# Patient Record
Sex: Female | Born: 1935 | Race: White | Hispanic: No | Smoking: Former smoker
Health system: Southern US, Community
[De-identification: ages and names within clinical notes are randomized; demographics above are authoritative.]

## PROBLEM LIST (undated history)

## (undated) DIAGNOSIS — I679 Cerebrovascular disease, unspecified: Secondary | ICD-10-CM

## (undated) DIAGNOSIS — I351 Nonrheumatic aortic (valve) insufficiency: Secondary | ICD-10-CM

## (undated) DIAGNOSIS — I509 Heart failure, unspecified: Secondary | ICD-10-CM

## (undated) DIAGNOSIS — Z8719 Personal history of other diseases of the digestive system: Secondary | ICD-10-CM

## (undated) DIAGNOSIS — I340 Nonrheumatic mitral (valve) insufficiency: Secondary | ICD-10-CM

## (undated) DIAGNOSIS — K219 Gastro-esophageal reflux disease without esophagitis: Secondary | ICD-10-CM

## (undated) DIAGNOSIS — I350 Nonrheumatic aortic (valve) stenosis: Secondary | ICD-10-CM

## (undated) DIAGNOSIS — IMO0001 Reserved for inherently not codable concepts without codable children: Secondary | ICD-10-CM

## (undated) DIAGNOSIS — I739 Peripheral vascular disease, unspecified: Secondary | ICD-10-CM

## (undated) DIAGNOSIS — I10 Essential (primary) hypertension: Secondary | ICD-10-CM

## (undated) DIAGNOSIS — E119 Type 2 diabetes mellitus without complications: Secondary | ICD-10-CM

## (undated) DIAGNOSIS — I2510 Atherosclerotic heart disease of native coronary artery without angina pectoris: Secondary | ICD-10-CM

## (undated) DIAGNOSIS — M1990 Unspecified osteoarthritis, unspecified site: Secondary | ICD-10-CM

## (undated) DIAGNOSIS — I6529 Occlusion and stenosis of unspecified carotid artery: Secondary | ICD-10-CM

## (undated) DIAGNOSIS — E785 Hyperlipidemia, unspecified: Secondary | ICD-10-CM

## (undated) HISTORY — DX: Peripheral vascular disease, unspecified: I73.9

## (undated) HISTORY — PX: ABDOMINAL HYSTERECTOMY: SHX81

## (undated) HISTORY — DX: Nonrheumatic mitral (valve) insufficiency: I34.0

## (undated) HISTORY — DX: Heart failure, unspecified: I50.9

## (undated) HISTORY — DX: Type 2 diabetes mellitus without complications: E11.9

## (undated) HISTORY — PX: EYE SURGERY: SHX253

## (undated) HISTORY — DX: Cerebrovascular disease, unspecified: I67.9

## (undated) HISTORY — DX: Essential (primary) hypertension: I10

## (undated) HISTORY — DX: Occlusion and stenosis of unspecified carotid artery: I65.29

## (undated) HISTORY — DX: Hyperlipidemia, unspecified: E78.5

## (undated) HISTORY — DX: Nonrheumatic aortic (valve) insufficiency: I35.1

## (undated) HISTORY — DX: Atherosclerotic heart disease of native coronary artery without angina pectoris: I25.10

---

## 1997-11-13 ENCOUNTER — Inpatient Hospital Stay (HOSPITAL_COMMUNITY): Admission: RE | Admit: 1997-11-13 | Discharge: 1997-11-14 | Payer: Self-pay | Admitting: *Deleted

## 2000-05-24 HISTORY — PX: TYMPANOMASTOIDECTOMY: SHX34

## 2006-05-24 HISTORY — PX: OTHER SURGICAL HISTORY: SHX169

## 2007-01-12 ENCOUNTER — Ambulatory Visit: Payer: Self-pay | Admitting: *Deleted

## 2007-02-01 ENCOUNTER — Ambulatory Visit: Payer: Self-pay | Admitting: Vascular Surgery

## 2007-04-07 ENCOUNTER — Inpatient Hospital Stay (HOSPITAL_COMMUNITY): Admission: EM | Admit: 2007-04-07 | Discharge: 2007-04-10 | Payer: Self-pay | Admitting: Internal Medicine

## 2007-04-07 ENCOUNTER — Ambulatory Visit: Payer: Self-pay | Admitting: Internal Medicine

## 2007-04-07 ENCOUNTER — Encounter: Payer: Self-pay | Admitting: Internal Medicine

## 2007-04-07 ENCOUNTER — Ambulatory Visit: Payer: Self-pay | Admitting: Cardiovascular Disease

## 2007-04-07 HISTORY — PX: CARDIAC CATHETERIZATION: SHX172

## 2007-05-01 ENCOUNTER — Ambulatory Visit: Payer: Self-pay

## 2007-05-01 LAB — CONVERTED CEMR LAB
BUN: 16 mg/dL (ref 6–23)
CO2: 31 meq/L (ref 19–32)
Chloride: 105 meq/L (ref 96–112)
Creatinine, Ser: 1 mg/dL (ref 0.4–1.2)
Sodium: 142 meq/L (ref 135–145)

## 2007-05-02 ENCOUNTER — Ambulatory Visit: Payer: Self-pay | Admitting: Cardiology

## 2007-05-31 ENCOUNTER — Ambulatory Visit: Payer: Self-pay | Admitting: Cardiology

## 2007-05-31 LAB — CONVERTED CEMR LAB
Bilirubin, Direct: 0.1 mg/dL (ref 0.0–0.3)
Cholesterol: 146 mg/dL (ref 0–200)
HDL: 42.8 mg/dL (ref >39.0)
LDL Cholesterol: 76 mg/dL (ref 0–99)
Total CHOL/HDL Ratio: 3.4
Total Protein: 6.8 g/dL (ref 6.0–8.3)

## 2007-06-22 ENCOUNTER — Ambulatory Visit: Payer: Self-pay | Admitting: *Deleted

## 2007-06-22 ENCOUNTER — Ambulatory Visit: Payer: Self-pay | Admitting: Cardiology

## 2007-06-22 LAB — CONVERTED CEMR LAB
BUN: 17 mg/dL (ref 6–23)
Calcium: 9 mg/dL (ref 8.4–10.5)
Chloride: 108 meq/L (ref 96–112)
GFR calc non Af Amer: 74 mL/min

## 2007-07-27 ENCOUNTER — Ambulatory Visit: Payer: Self-pay | Admitting: Cardiology

## 2007-08-03 ENCOUNTER — Ambulatory Visit: Payer: Self-pay | Admitting: Cardiology

## 2007-08-03 LAB — CONVERTED CEMR LAB
BUN: 18 mg/dL (ref 6–23)
Calcium: 9.1 mg/dL (ref 8.4–10.5)
Chloride: 105 meq/L (ref 96–112)
Creatinine, Ser: 0.8 mg/dL (ref 0.4–1.2)
Potassium: 4.6 meq/L (ref 3.5–5.1)

## 2007-12-14 ENCOUNTER — Ambulatory Visit: Payer: Self-pay | Admitting: *Deleted

## 2008-01-31 ENCOUNTER — Ambulatory Visit: Payer: Self-pay | Admitting: Vascular Surgery

## 2008-02-08 ENCOUNTER — Ambulatory Visit: Payer: Self-pay | Admitting: Cardiology

## 2008-02-19 ENCOUNTER — Ambulatory Visit: Payer: Self-pay

## 2008-02-19 ENCOUNTER — Ambulatory Visit: Payer: Self-pay | Admitting: Cardiology

## 2008-02-19 ENCOUNTER — Encounter: Payer: Self-pay | Admitting: Cardiology

## 2008-02-19 LAB — CONVERTED CEMR LAB
AST: 20 units/L (ref 0–37)
Albumin: 4 g/dL (ref 3.5–5.2)
BUN: 19 mg/dL (ref 6–23)
Chloride: 108 meq/L (ref 96–112)
Cholesterol: 196 mg/dL (ref 0–200)
Creatinine, Ser: 0.9 mg/dL (ref 0.4–1.2)
GFR calc non Af Amer: 64 mL/min
LDL Cholesterol: 121 mg/dL — ABNORMAL HIGH (ref 0–99)
Triglycerides: 162 mg/dL — ABNORMAL HIGH (ref 0–149)
VLDL: 32 mg/dL (ref 0–40)

## 2008-03-29 ENCOUNTER — Ambulatory Visit: Payer: Self-pay | Admitting: Cardiology

## 2008-03-29 LAB — CONVERTED CEMR LAB
ALT: 17 units/L (ref 0–35)
Alkaline Phosphatase: 78 units/L (ref 39–117)
Bilirubin, Direct: 0.1 mg/dL (ref 0.0–0.3)
Cholesterol: 133 mg/dL (ref 0–200)
Total Protein: 7.2 g/dL (ref 6.0–8.3)
VLDL: 21 mg/dL (ref 0–40)

## 2008-06-13 ENCOUNTER — Ambulatory Visit: Payer: Self-pay | Admitting: *Deleted

## 2008-06-17 ENCOUNTER — Encounter: Admission: RE | Admit: 2008-06-17 | Discharge: 2008-06-17 | Payer: Self-pay

## 2008-06-21 ENCOUNTER — Encounter: Admission: RE | Admit: 2008-06-21 | Discharge: 2008-06-21 | Payer: Self-pay

## 2008-07-05 ENCOUNTER — Encounter: Admission: RE | Admit: 2008-07-05 | Discharge: 2008-07-05 | Payer: Self-pay

## 2008-08-02 DIAGNOSIS — I10 Essential (primary) hypertension: Secondary | ICD-10-CM

## 2008-08-02 DIAGNOSIS — I359 Nonrheumatic aortic valve disorder, unspecified: Secondary | ICD-10-CM

## 2008-08-02 DIAGNOSIS — I6350 Cerebral infarction due to unspecified occlusion or stenosis of unspecified cerebral artery: Secondary | ICD-10-CM | POA: Insufficient documentation

## 2008-08-02 DIAGNOSIS — E785 Hyperlipidemia, unspecified: Secondary | ICD-10-CM

## 2008-08-02 DIAGNOSIS — I739 Peripheral vascular disease, unspecified: Secondary | ICD-10-CM

## 2008-08-05 ENCOUNTER — Encounter: Payer: Self-pay | Admitting: Cardiology

## 2008-08-05 ENCOUNTER — Ambulatory Visit: Payer: Self-pay | Admitting: Cardiology

## 2008-08-05 DIAGNOSIS — I7389 Other specified peripheral vascular diseases: Secondary | ICD-10-CM | POA: Insufficient documentation

## 2008-08-05 DIAGNOSIS — I2510 Atherosclerotic heart disease of native coronary artery without angina pectoris: Secondary | ICD-10-CM

## 2008-08-05 DIAGNOSIS — E119 Type 2 diabetes mellitus without complications: Secondary | ICD-10-CM | POA: Insufficient documentation

## 2008-08-12 ENCOUNTER — Ambulatory Visit: Payer: Self-pay | Admitting: Cardiology

## 2008-08-12 LAB — CONVERTED CEMR LAB
ALT: 18 units/L (ref 0–35)
Alkaline Phosphatase: 78 units/L (ref 39–117)
BUN: 16 mg/dL (ref 6–23)
Bilirubin, Direct: 0 mg/dL (ref 0.0–0.3)
Calcium: 8.8 mg/dL (ref 8.4–10.5)
Cholesterol: 165 mg/dL (ref 0–200)
Creatinine, Ser: 0.8 mg/dL (ref 0.4–1.2)
GFR calc non Af Amer: 73.2 mL/min (ref >60)
Potassium: 4.1 meq/L (ref 3.5–5.1)
Total Protein: 6.9 g/dL (ref 6.0–8.3)

## 2008-12-11 ENCOUNTER — Ambulatory Visit: Payer: Self-pay | Admitting: Vascular Surgery

## 2008-12-16 ENCOUNTER — Encounter (INDEPENDENT_AMBULATORY_CARE_PROVIDER_SITE_OTHER): Payer: Self-pay | Admitting: *Deleted

## 2009-03-18 ENCOUNTER — Ambulatory Visit: Payer: Self-pay | Admitting: Cardiology

## 2009-03-20 ENCOUNTER — Telehealth: Payer: Self-pay | Admitting: Cardiology

## 2009-03-26 ENCOUNTER — Encounter (INDEPENDENT_AMBULATORY_CARE_PROVIDER_SITE_OTHER): Payer: Self-pay | Admitting: *Deleted

## 2009-04-03 ENCOUNTER — Telehealth (INDEPENDENT_AMBULATORY_CARE_PROVIDER_SITE_OTHER): Payer: Self-pay

## 2009-04-07 ENCOUNTER — Ambulatory Visit: Payer: Self-pay

## 2009-04-07 ENCOUNTER — Encounter: Payer: Self-pay | Admitting: Cardiology

## 2009-04-07 ENCOUNTER — Encounter (HOSPITAL_COMMUNITY): Admission: RE | Admit: 2009-04-07 | Discharge: 2009-05-23 | Payer: Self-pay | Admitting: Cardiology

## 2009-04-07 ENCOUNTER — Ambulatory Visit: Payer: Self-pay | Admitting: Internal Medicine

## 2009-04-07 ENCOUNTER — Ambulatory Visit (HOSPITAL_COMMUNITY): Admission: RE | Admit: 2009-04-07 | Discharge: 2009-04-07 | Payer: Self-pay | Admitting: Cardiology

## 2009-09-11 ENCOUNTER — Telehealth: Payer: Self-pay | Admitting: Cardiology

## 2010-01-15 ENCOUNTER — Ambulatory Visit: Payer: Self-pay | Admitting: Vascular Surgery

## 2010-02-27 ENCOUNTER — Encounter (INDEPENDENT_AMBULATORY_CARE_PROVIDER_SITE_OTHER): Payer: Self-pay | Admitting: *Deleted

## 2010-02-27 ENCOUNTER — Ambulatory Visit: Payer: Self-pay | Admitting: Cardiology

## 2010-03-09 ENCOUNTER — Encounter: Payer: Self-pay | Admitting: Cardiology

## 2010-03-09 ENCOUNTER — Ambulatory Visit: Payer: Self-pay

## 2010-03-09 ENCOUNTER — Ambulatory Visit: Payer: Self-pay | Admitting: Cardiology

## 2010-03-09 ENCOUNTER — Ambulatory Visit (HOSPITAL_COMMUNITY): Admission: RE | Admit: 2010-03-09 | Discharge: 2010-03-09 | Payer: Self-pay

## 2010-06-21 LAB — CONVERTED CEMR LAB
ALT: 14 units/L (ref 0–35)
Albumin: 3.8 g/dL (ref 3.5–5.2)
Alkaline Phosphatase: 89 units/L (ref 39–117)
BUN: 12 mg/dL (ref 6–23)
BUN: 15 mg/dL (ref 6–23)
Bilirubin, Direct: 0 mg/dL (ref 0.0–0.3)
Chloride: 106 meq/L (ref 96–112)
Cholesterol: 174 mg/dL (ref 0–200)
Cholesterol: 185 mg/dL (ref 0–200)
Creatinine, Ser: 0.8 mg/dL (ref 0.4–1.2)
Creatinine, Ser: 0.8 mg/dL (ref 0.4–1.2)
GFR calc non Af Amer: 73.09 mL/min (ref >60)
Glucose, Bld: 106 mg/dL — ABNORMAL HIGH (ref 70–99)
LDL Cholesterol: 100 mg/dL — ABNORMAL HIGH (ref 0–99)
LDL Cholesterol: 114 mg/dL — ABNORMAL HIGH (ref 0–99)
Total Bilirubin: 0.5 mg/dL (ref 0.3–1.2)
Total Bilirubin: 0.7 mg/dL (ref 0.3–1.2)
Total Protein: 6.8 g/dL (ref 6.0–8.3)
Triglycerides: 119 mg/dL (ref 0.0–149.0)
VLDL: 23.8 mg/dL (ref 0.0–40.0)

## 2010-06-23 NOTE — Letter (Signed)
Summary: Custom - Lipid  [facility], Main Office  [address]   [place], [place] [address]   Phone: [phone]  Fax: [phone]     February 27, 2010 MRN: [id number]   BREAWNA MONTENEGRO [address] [place], [place]  [address]   Dear Ms. Alcock,  We have reviewed your cholesterol results.  They are as follows:     Total Cholesterol:    174 (Desirable: less than 200)       HDL  Cholesterol:     51.90  (Desirable: greater than 40 for men and 50 for women)       LDL Cholesterol:       100  (Desirable: less than 100 for low risk and less than 70 for moderate to high risk)       Triglycerides:       112.0  (Desirable: less than 150)  Our recommendations include:These numbers look good. Continue on the same medicine. Sodium, potassium, kidney and Liver function are normal. Take care, Dr. Darel Hong.    Call our office at the number listed above if you have any questions.  Lowering your LDL cholesterol is important, but it is only one of a large number of "risk factors" that may indicate that you are at risk for heart disease, stroke or other complications of hardening of the arteries.  Other risk factors include:   A.  Cigarette Smoking* B.  High Blood Pressure* C.  Obesity* D.   Low HDL Cholesterol (see yours above)* E.   Diabetes Mellitus (higher risk if your is uncontrolled) F.  Family history of premature heart disease G.  Previous history of stroke or cardiovascular disease    *These are risk factors YOU HAVE CONTROL OVER.  For more information, visit .  There is now evidence that lowering the TOTAL CHOLESTEROL AND LDL CHOLESTEROL can reduce the risk of heart disease.  The American Heart Association recommends the following guidelines for the treatment of elevated cholesterol:  1.  If there is now current heart disease and less than two risk factors, TOTAL CHOLESTEROL should be less than 200 and LDL CHOLESTEROL should be less than 100. 2.  If  there is current heart disease or two or more risk factors, TOTAL CHOLESTEROL should be less than 200 and LDL CHOLESTEROL should be less than 70.  A diet low in cholesterol, saturated fat, and calories is the cornerstone of treatment for elevated cholesterol.  Cessation of smoking and exercise are also important in the management of elevated cholesterol and preventing vascular disease.  Studies have shown that 30 to 60 minutes of physical activity most days can help lower blood pressure, lower cholesterol, and keep your weight at a healthy level.  Drug therapy is used when cholesterol levels do not respond to therapeutic lifestyle changes (smoking cessation, diet, and exercise) and remains unacceptably high.  If medication is started, it is important to have you levels checked periodically to evaluate the need for further treatment options.  Thank you,    [employer] Team

## 2010-06-23 NOTE — Progress Notes (Signed)
Summary: med question/calling back  Medications Added LISINOPRIL 40 MG TABS (LISINOPRIL) Take one tablet by mouth daily       Phone Note Call from Patient Call back at [phone]   Caller: Daughter/Shirley Summary of Call: has a question about lisinopril dosage, she thought it was 40mg  1 tab daily Initial call taken by: Migdalia Dk,  September 11, 2009 12:25 PM  Follow-up for Phone Call        [facility] Scherrie Bateman, LPN  September 11, 2009 12:45 PM lmtcb Scherrie Bateman, LPN  September 15, 2009 3:13 PM  spoke with dtr, questions answered Deliah Goody, RN  September 17, 2009 8:37 AM     New/Updated Medications: LISINOPRIL 40 MG TABS (LISINOPRIL) Take one tablet by mouth daily Prescriptions: LISINOPRIL 40 MG TABS (LISINOPRIL) Take one tablet by mouth daily  #90 x 4   Entered by:   Deliah Goody, RN   Authorized by:   Ferman Hamming, MD, [facility]   Signed by:   Deliah Goody, RN on 09/17/2009   Method used:   Electronically to        [facility] [phone][facility] (retail)       [address]       [place], [place]  [address]       Ph: [phone]       Fax: [phone]   RxID:   [id number]

## 2010-06-23 NOTE — Progress Notes (Signed)
Summary: med question  Medications Added PLAVIX 75 MG TABS (CLOPIDOGREL BISULFATE) 1 tab by mouth once daily ASPIRIN 81 MG  TABS (ASPIRIN) 1 tab by mouth once daily LIPITOR 80 MG TABS (ATORVASTATIN CALCIUM) 1 tab by mouth once daily LOPRESSOR 50 MG TABS (METOPROLOL TARTRATE) 1 tab two times a day LYRICA 150 MG CAPS (PREGABALIN) 1 tab two times a day GLIPIZIDE 5 MG TABS (GLIPIZIDE) 1 tab by mouth once daily FOSAMAX 70 MG TABS (ALENDRONATE SODIUM) 1 tab by mouth weekly FOSAMAX 70 MG TABS (ALENDRONATE SODIUM) 1 tab by mouth weekly LISINOPRIL 20 MG TABS (LISINOPRIL) two times a day LISINOPRIL 20 MG TABS (LISINOPRIL) 1 tab by mouth once daily * CALCIUM 600 -VITAMIN D  TABS (CALCIUM-VITAMIN D) 1 tab by mouth once daily       Phone Note Call from Patient Call back at [phone]   Caller: Daughter/Shirley Summary of Call: has a question about lisinopril Initial call taken by: Migdalia Dk,  September 11, 2009 8:45 AM  Follow-up for Phone Call        PER DAUGHTER NEEDED REFILL ON  LISINOPRIL .DONE THROUGH EMR Follow-up by: Scherrie Bateman, LPN,  September 11, 2009 10:18 AM    Prescriptions: LISINOPRIL 20 MG TABS (LISINOPRIL) two times a day  #180 x 3   Entered by:   Scherrie Bateman, LPN   Authorized by:   Ferman Hamming, MD, [facility]   Signed by:   Scherrie Bateman, LPN on 45/40/9811   Method used:   [facility]ronically to        [facility] [phone]* (retail)       [address]       [place], [place]  [address]       Ph: [phone]       Fax: [phone]   RxID:   [id number]

## 2010-06-23 NOTE — Assessment & Plan Note (Signed)
Summary: 1 YR/DMP    CC:  yearly visit.  History of Present Illness: Pleasant female with past medical history of coronary artery disease status post drug-eluting stent to the LAD and circumflex in November of 2008, moderate aortic stenosis and mild to moderate mitral regurgitation. Last echocardiogram was performed in Nov 2010. There was normal LV function. There was moderate aortic stenosis with a mean gradient of 22 mmHg. There was mild mitral regurgitation. Myoview in November of 2010 showed inferolateral thinning but no ischemia. Ejection fraction was 58%. I last saw her in Oct 2010. Since then she does have dyspnea with moderate activities but not with routine activities. It is relieved with rest. There is no associated chest pain. No orthopnea,  pedal edema, palpitations, syncoe or exertional chest pain. Occasional PND.  Current Medications (verified): 1)  Aspirin 81 Mg  Tabs (Aspirin) .Marland Kitchen.. 1 Tab By Mouth Once Daily 2)  Lipitor 80 Mg Tabs (Atorvastatin Calcium) .Marland Kitchen.. 1 Tab By Mouth Once Daily 3)  Lopressor 50 Mg Tabs (Metoprolol Tartrate) .Marland Kitchen.. 1 Tab Two Times A Day 4)  Lyrica 150 Mg Caps (Pregabalin) .Marland Kitchen.. 1 Tab Two Times A Day 5)  Glipizide 5 Mg Tabs (Glipizide) .Marland Kitchen.. 1 Tab By Mouth Once Daily 6)  Lisinopril 40 Mg Tabs (Lisinopril) .... Take One Tablet By Mouth Daily  Allergies: No Known Drug Allergies  Past History:  Past Medical History: Aortic Stenosis  mildl regurgitation CAD Cerebrovascular Disease Diabetes Type 1 Hyperlipidemia Hypertension PVD  Past Surgical History: Reviewed history from 08/05/2008 and no changes required. right tympanomastidectomy 2002 heart cath 04/07/07 by Dr Excell Seltzer 1. Severe LAD stenosis with successful PCI using a drug-eluting stent.   2. Severe left circumflex stenosis with successful PCI using a single       drug-eluting stent.  Social History: Reviewed history from 03/18/2009 and no changes required. 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 Remaining systems are negative.   Vital Signs:  Patient profile:   77 year old female Height:      62 inches Weight:      106 pounds BMI:     19.46 Pulse rate:   53 / minute Resp:     16 per minute BP sitting:   175 / 65  (left arm)  Vitals Entered By: Kem Parkinson (February 27, 2010 8:51 AM)  Physical Exam  General:  Well-developed well-nourished in no acute distress.  Skin is warm and dry.  HEENT is normal.  Neck is supple. No thyromegaly.  Chest is clear to auscultation with normal expansion.  Cardiovascular exam is regular rate and rhythm. 3/6 systolic murmur left sternal border. s2 preserved Abdominal exam nontender or distended. No masses palpated. Extremities show no edema. neuro grossly intact    EKG  Procedure date:  02/27/2010  Findings:      Sinus bradycardia at a rate of 52. Nonspecific ST changes.  Impression & Recommendations:  Problem # 1:  PERIPHERAL VASCULAR DISEASE WITH CLAUDICATION (ICD-443.89) Peripheral vascular disease and cerebrovascular disease followed by vascular surgery. Continue aspirin and statin.   Problem # 2:  AODM (ICD-250.00)  Her updated medication list for this problem includes:    Aspirin  81 Mg Tabs (Aspirin) .Marland Kitchen... 1 tab by mouth once daily    Glipizide 5 Mg Tabs (Glipizide) .Marland Kitchen... 1 tab by mouth once daily    Lisinopril 40 Mg Tabs (Lisinopril) .Marland Kitchen... Take one tablet by mouth daily  Her updated medication list for this problem includes:    Aspirin 81 Mg Tabs (Aspirin) .Marland Kitchen... 1 tab by mouth once daily    Glipizide 5 Mg Tabs (Glipizide) .Marland Kitchen... 1 tab by mouth once daily    Lisinopril 40 Mg Tabs  (Lisinopril) .Marland Kitchen... Take one tablet by mouth daily  Problem # 3:  CORONARY ATHEROSCLEROSIS NATIVE CORONARY ARTERY (ICD-414.01) Ctinue aspirin, beta blocker, ACE inhibitor and statin. The following medications were removed from the medication list:    Plavix 75 Mg Tabs (Clopidogrel bisulfate) .Marland Kitchen... 1 tab by mouth once daily Her updated medication list for this problem includes:    Aspirin 81 Mg Tabs (Aspirin) .Marland Kitchen... 1 tab by mouth once daily    Lopressor 50 Mg Tabs (Metoprolol tartrate) .Marland Kitchen... 1 tab two times a day    Lisinopril 40 Mg Tabs (Lisinopril) .Marland Kitchen... Take one tablet by mouth daily  The following medications were removed from the medication list:    Plavix 75 Mg Tabs (Clopidogrel bisulfate) .Marland Kitchen... 1 tab by mouth once daily Her updated medication list for this problem includes:    Aspirin 81 Mg Tabs (Aspirin) .Marland Kitchen... 1 tab by mouth once daily    Lopressor 50 Mg Tabs (Metoprolol tartrate) .Marland Kitchen... 1 tab two times a day    Lisinopril 40 Mg Tabs (Lisinopril) .Marland Kitchen... Take one tablet by mouth daily  Problem # 4:  AORTIC STENOSIS (ICD-424.1) Dyspnea on Exertion but Aortic Stenosis Does Not Sound Severe on Examination. Repeat Echocardiogram. Patient understands she will most likely need aortic valve replacement in the future. Her updated medication list for this problem includes:    Lopressor 50 Mg Tabs (Metoprolol tartrate) .Marland Kitchen... 1 tab two times a day    Lisinopril 40 Mg Tabs (Lisinopril) .Marland Kitchen... Take one tablet by mouth daily  Orders: Echocardiogram (Echo)  Problem # 5:  HYPERLIPIDEMIA-MIXED (ICD-272.4) Continue statin. Check lipids and liver. Her updated medication list for this problem includes:    Lipitor 80 Mg Tabs (Atorvastatin calcium) .Marland Kitchen... 1 tab by mouth once daily  Orders: TLB-Lipid Panel (80061-LIPID) TLB-Hepatic/Liver Function Pnl (80076-HEPATIC)  Problem # 6:  HYPERTENSION, BENIGN (ICD-401.1) Blood pressure mildly elevated but she has not taken her medications this a.m. She will  follow this and we will adjust as needed. Check potassium and renal function. Her updated medication list for this problem includes:    Aspirin 81 Mg Tabs (Aspirin) .Marland Kitchen... 1 tab by mouth once daily    Lopressor 50 Mg Tabs (Metoprolol tartrate) .Marland Kitchen... 1 tab two times a day    Lisinopril 40 Mg Tabs (Lisinopril) .Marland Kitchen... Take one tablet by mouth daily  Orders: TLB-BMP (Basic Metabolic Panel-BMET) (80048-METABOL)  Patient Instructions: 1)  Your physician recommends that you schedule a follow-up appointment in:  6 MONTHS 2)  Your physician has requested that you have an echocardiogram.  Echocardiography is a painless test that uses sound waves to create images of your heart. It provides your doctor with information about the size and shape of your heart and how well your heart's chambers and valves are working.  This procedure takes approximately one hour. There are no restrictions for this procedure.

## 2010-07-21 ENCOUNTER — Ambulatory Visit (INDEPENDENT_AMBULATORY_CARE_PROVIDER_SITE_OTHER): Payer: Medicare Other | Admitting: Vascular Surgery

## 2010-07-21 ENCOUNTER — Other Ambulatory Visit (INDEPENDENT_AMBULATORY_CARE_PROVIDER_SITE_OTHER): Payer: Medicare Other

## 2010-07-21 DIAGNOSIS — I6529 Occlusion and stenosis of unspecified carotid artery: Secondary | ICD-10-CM

## 2010-07-22 NOTE — Assessment & Plan Note (Signed)
OFFICE VISIT  MIRIANA, GAERTNER DOB:  [birth year]                                       07/21/2010 ZOXWR#:[id number]  The patient presents today for continued follow-up of her asymptomatic carotid disease.  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  She is here today for evaluation and follow-up of extracranial cerebrovascular occlusive disease.  This has been followed for some time in our office. She has not had any prior amaurosis fugax, transient ischemic attack or stroke.  She has had no significant progression in past studies.  She also reports that her walking has improved with minimal discomfort from her varicosities or her lower extremity mild arterial insufficiency.  PHYSICAL EXAMINATION:  A well-developed, well-nourished white female appearing younger than stated age.  No acute distress.  Blood pressure is 168/65 in the right arm, 188/75 in her left arm, heart rate 62, respirations 18.  HEENT is normal.  Her carotid arteries reveal a harsh right carotid bruit, soft left carotid bruit.  Radial pulses are 2+ bilaterally.  Musculoskeletal shows no major deformity or cyanosis. Neurologic:  No focal weakness or paresthesia.  Skin without ulcers or rashes.  She underwent a repeat carotid duplex in our office.  This reveals no stenosis in her right carotid system.  She does have moderate left stenosis in the 60%-79% range.  She does have tortuosity, which may be partially responsible for this elevated reading.  I discussed this with the patient and her daughter present.  I have recommended continued 6- month serial duplex follow-up to rule out any progression.  She understands symptoms of carotid disease and will notify us if any of these should occur.    Larina Earthly, M.D. Electronically Signed  TFE/MEDQ  D:  07/21/2010  T:  07/22/2010  Job:  [id number]  cc:   Dr. Lillie Columbia,  [place]

## 2010-07-28 NOTE — Procedures (Unsigned)
CAROTID DUPLEX EXAM  INDICATION:  Follow up carotid artery disease.  HISTORY: Diabetes:  No. Cardiac:  MI and CABG. Hypertension:  Yes. Smoking:  Previous. Previous Surgery:  No. CV History:  Patient is currently asymptomatic. Amaurosis Fugax No, Paresthesias No, Hemiparesis No.                                      RIGHT             LEFT Brachial systolic pressure:         148               142 Brachial Doppler waveforms:         WNL               WNL Vertebral direction of flow:        Antegrade         Antegrade DUPLEX VELOCITIES (cm/sec) CCA peak systolic                   77                68 ECA peak systolic                   182               187 ICA peak systolic                   95                210 ICA end diastolic                   29                63 PLAQUE MORPHOLOGY:                  Heterogenous      Heterogenous PLAQUE AMOUNT:                      Mild              Moderate PLAQUE LOCATION:                    ICA               ICA  IMPRESSION: 1. Right side, no hemodynamically significant stenosis within the     internal carotid artery. 2. Left side is 60% to 79% stenosis.  This could be secondary to     vessel tortuosity. 3. Right external carotid artery stenosis. 4. Bilateral mild intimal thickening within the common carotid     arteries. 5. Study is stable compared to previous.  ___________________________________________ Larina Earthly, M.D.  OD/MEDQ  D:  07/21/2010  T:  07/21/2010  Job:  [id number]

## 2010-09-14 ENCOUNTER — Encounter: Payer: Self-pay | Admitting: Cardiology

## 2010-09-15 ENCOUNTER — Ambulatory Visit (INDEPENDENT_AMBULATORY_CARE_PROVIDER_SITE_OTHER): Payer: Medicare Other | Admitting: Cardiology

## 2010-09-15 ENCOUNTER — Encounter: Payer: Self-pay | Admitting: Cardiology

## 2010-09-15 VITALS — BP 160/80 | HR 51 | Resp 12 | Ht 61.0 in | Wt 112.0 lb

## 2010-09-15 DIAGNOSIS — E785 Hyperlipidemia, unspecified: Secondary | ICD-10-CM

## 2010-09-15 DIAGNOSIS — I10 Essential (primary) hypertension: Secondary | ICD-10-CM

## 2010-09-15 DIAGNOSIS — I2510 Atherosclerotic heart disease of native coronary artery without angina pectoris: Secondary | ICD-10-CM

## 2010-09-15 DIAGNOSIS — I359 Nonrheumatic aortic valve disorder, unspecified: Secondary | ICD-10-CM

## 2010-09-15 DIAGNOSIS — I350 Nonrheumatic aortic (valve) stenosis: Secondary | ICD-10-CM

## 2010-09-15 DIAGNOSIS — I739 Peripheral vascular disease, unspecified: Secondary | ICD-10-CM

## 2010-09-15 NOTE — Patient Instructions (Signed)
Your physician recommends that you schedule a follow-up appointment in: 6 months with Dr. Jens Som   Your physician has requested that you have an echocardiogram. Echocardiography is a painless test that uses sound waves to create images of your heart. It provides your doctor with information about the size and shape of your heart and how well your heart's chambers and valves are working. This procedure takes approximately one hour. There are no restrictions for this procedure.

## 2010-09-15 NOTE — Assessment & Plan Note (Signed)
Patient has both aortic stenosis and aortic insufficiency. It had worsened on most recent echocardiogram. No significant symptoms. Repeat echo. I explained to patient that she will most likely require aortic valve replacement in the future.

## 2010-09-15 NOTE — Assessment & Plan Note (Signed)
Continue statin. 

## 2010-09-15 NOTE — Assessment & Plan Note (Signed)
Blood pressure elevated but she has not taken her medication yet today. We will follow and adjust as needed.

## 2010-09-15 NOTE — Progress Notes (Signed)
BMW:UXLKGMWN female with past medical history of coronary artery disease status post drug-eluting stent to the LAD and circumflex in November of 2008, AS/AI. Last echocardiogram was performed in Oct of 2011. There was normal LV function. There was moderate to severe aortic stenosis with a mean gradient of 31 mmHg; moderate AI. There was mild mitral regurgitation. Myoview in November of 2010 showed inferolateral thinning but no ischemia. Ejection fraction was 58%. I last saw her in Oct 2011. Since then she does have dyspnea with moderate activities but not with routine activities. It is relieved with rest. There is no associated chest pain. No orthopnea,  pedal edema, palpitations, syncope or exertional chest pain.  Current Outpatient Prescriptions  Medication Sig Dispense Refill  . aspirin 81 MG tablet Take 81 mg by mouth daily.        Marland Kitchen atorvastatin (LIPITOR) 80 MG tablet Take 80 mg by mouth daily.        Marland Kitchen glipiZIDE (GLUCOTROL) 5 MG tablet Take 5 mg by mouth daily.        Marland Kitchen lisinopril (PRINIVIL,ZESTRIL) 40 MG tablet Take 40 mg by mouth daily.        . metoprolol (LOPRESSOR) 50 MG tablet Take 50 mg by mouth 2 (two) times daily.        . pregabalin (LYRICA) 150 MG capsule Take 150 mg by mouth 2 (two) times daily.           Past Medical History  Diagnosis Date  . Aortic stenosis   . CAD (coronary artery disease)   . CVD (cerebrovascular disease)   . Diabetes mellitus type 1   . Hyperlipidemia   . Hypertension   . PVD (peripheral vascular disease)   . Mitral regurgitation   . Aortic insufficiency     Past Surgical History  Procedure Date  . Tympanomastoidectomy 2002    right  . Cardiac catheterization 04/07/07    By Dr. Excell Seltzer -- 1. Severe LAD stenosis with successful PCI using a drug-eluting stent. 2. Severe left circumflex stenosis with successful PCI using a single  drug-eluting stent.    History   Social History  . Marital Status: Widowed    Spouse Name: N/A    Number of  Children: N/A  . Years of Education: N/A   Occupational History  . 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 Smoking status: Former Games developer  . Smokeless tobacco: Not on file  . Alcohol Use: Yes  . Drug Use: No  . Sexually Active: Not on file   Other Topics Concern  . Not on file   Social History Narrative  . No narrative on file    ROS: no fevers or chills, productive cough, hemoptysis, dysphasia, odynophagia, melena, hematochezia, dysuria, hematuria, rash, seizure activity, orthopnea, PND, pedal edema, claudication. Remaining systems are negative.  Physical Exam: Well-developed well-nourished in no acute distress.  Skin is warm and dry.  HEENT is normal.  Neck is supple. No thyromegaly.  Chest is clear to auscultation with normal expansion.  Cardiovascular exam is regular rate and rhythm. 3/6 systolic murmur left sternal border. Abdominal exam nontender or distended. No masses palpated. Extremities show no edema. neuro grossly intact  ECG Sinus bradycardia at a rate of 51. Prior inferior infarct. Left ventricular hypertrophy with repolarization abnormality.

## 2010-09-15 NOTE — Assessment & Plan Note (Signed)
Continue aspirin, beta blocker and statin. 

## 2010-09-15 NOTE — Assessment & Plan Note (Signed)
Peripheral vascular disease and cerebrovascular disease followed by vascular surgery. Continue aspirin and statin.

## 2010-10-05 ENCOUNTER — Ambulatory Visit (HOSPITAL_COMMUNITY): Payer: Medicare Other | Attending: Cardiology | Admitting: Radiology

## 2010-10-05 DIAGNOSIS — I359 Nonrheumatic aortic valve disorder, unspecified: Secondary | ICD-10-CM | POA: Insufficient documentation

## 2010-10-05 DIAGNOSIS — I350 Nonrheumatic aortic (valve) stenosis: Secondary | ICD-10-CM

## 2010-10-06 NOTE — Assessment & Plan Note (Signed)
[facility]                            CARDIOLOGY OFFICE NOTE   BIRDELL, FRASIER                   MRN:          [id number]  DATE:07/27/2007                            DOB:          12-Oct-1925    Ms. Acoff is a pleasant 77 year old female with a history of coronary  disease status post drug-eluting stent to the LAD as well as the  circumflex.  This was performed in November of 2008.  Note, she also has  preserved LV function and mild aortic stenosis as well as mild-to-  moderate mitral regurgitation.  Since I last saw her, she is doing well.  She denies any dyspnea, chest pain, palpitations, or syncope.  There is  no pedal edema.  She does have some pain in her lower extremities  bilaterally with ambulation at longer distances.  She has discontinued  her tobacco use.   MEDICATIONS:  1. Plavix 75 mg p.o. daily.  2. Aspirin 81 mg p.o. daily.  3. Lipitor 80 mg p.o. daily.  4. Lopressor 50 mg p.o. b.i.d.  5. Lisinopril 10 mg p.o. daily.  6. Lyrica 150 mg p.o. b.i.d.  7. Glipizide XL 5 mg p.o. daily.  8. Fosamax.   PHYSICAL EXAMINATION:  VITAL SIGNS:  Blood pressure of 172/78, pulse is  56.  She weighs 107 pounds.  HEENT:  Normal.  NECK:  Supple.  CHEST:  Clear.  CARDIOVASCULAR:  Regular rhythm.  There is a 2/6 systolic murmur at the  left sternal border.  ABDOMEN:  Benign.  EXTREMITIES:  No edema.  She has 2+ dorsalis pedis pulse on the right  and 1+ on the left.   Her electrocardiogram shows a sinus bradycardia at a rate of 52.  There  is left ventricle hypertrophy and nonspecific ST changes.   DIAGNOSES:  1. Coronary artery disease status post drug-eluting stent to the left      anterior descending and circumflex in November of 2008.  She will      continue on her aspirin and Plavix as well as her angiotensin-      converting enzyme inhibitor, beta blocker, and statin.  2. History of aortic stenosis and mild-to-moderate mitral     regurgitation.  We will plan to repeat her echocardiogram in      November.  3. Hyperlipidemia.  She will continue on her statin.  4. Tobacco abuse.  She has now discontinued this.  5. Claudication.  We discussed continuing to increase her ambulation.      If this becomes significantly limiting in the future, then we can      pursue further evaluation.  Note, she has been seen by      cardiovascular and thoracic surgery in the past for this.  6. Cerebrovascular disease.  This is being followed by Dr. Madilyn Fireman of      vascular surgery.  7. Hypertension.  Blood pressure is elevated today.  We will increase      her lisinopril to 20 mg p.o. daily, and she will continue to track      this at home, and we will increase  as needed.  I will check a basic      metabolic panel in 1 week to follow potassium and renal function.  8. Diabetes mellitus.  Per primary care physician.   She will see me back in 6 months.     Madolyn Frieze Jens Som, MD, [facility]  Electronically Signed    BSC/MedQ  DD: 07/27/2007  DT: 07/27/2007  Job #: [id number]

## 2010-10-06 NOTE — Discharge Summary (Signed)
NAMEJADORE, Kara Santiago            ACCOUNT NO.:  [id number]   MEDICAL RECORD NO.:  [id number]          PATIENT TYPE:  INP   LOCATION:  2019                         FACILITY:  [facility]   PHYSICIAN:  Veverly Fells. Excell Seltzer, MD  DATE OF BIRTH:  1926-02-18   DATE OF ADMISSION:  04/07/2007  DATE OF DISCHARGE:  04/10/2007                               DISCHARGE SUMMARY   PRIMARY CARDIOLOGIST:  Dr. Olga Millers.   PRIMARY CARE PHYSICIAN:  Dr. Lillie Columbia at [facility].   PROCEDURES PERFORMED DURING HOSPITALIZATION:  Cardiac catheterization  per Dr. Tonny Bollman completed April 07, 2007 revealing:  1. Severe LAD stenosis.  2. Severe left circumflex stenosis.  3. Nonobstructive right coronary artery stenosis.  4. Calcified aortic valve with 15 to 20 mm peak gradient suggestive of      mild aortic stenosis.  5. Status post PCI of LAD stenosis using drug eluting stent.  6. Status post PCI of left circumflex using single drug eluting stent.   FINAL DISCHARGE DIAGNOSIS:  Non-ST elevated myocardial infarction.   SECONDARY DIAGNOSES:  1. Aortic valve stenosis with mild aortic stenosis per catheterization      with 15 to 20 mm peak gradient.  2. Hypertension.  3. Diabetes.  4. Tobacco abuse.   HOSPITAL COURSE:  This is a 77 year old Caucasian female who presented  with non-ST elevated MI and was seen emergently by Dr. Tonny Bollman  with emergent cardiac catheterization.  Positive cardiac enzymes were  found with troponin of 11.20, elevated from an initial troponin of 0.98.  The patient's CK MB was 155.6.  Chest x-ray revealed mild pulmonary  edema.  The patient was subsequently sent to the cardiac catheterization  lab secondary to symptoms of chest pain and catheterization was  completed.  The patient had successful PCI with a drug eluting stent to  the LAD, and to the left circumflex decreasing the LAD from 80% to 0%,  and left circumflex from 99% to 0%.  The patient did  have some  nonobstructive right coronary artery stenosis, 30% proximally, and 20%  mid.  The patient was treated with aspirin and Plavix, and followed over  the weekend for continued evaluation.  She was found to have some mild  pulmonary edema on chest x-ray and was given p.o. Lasix for diuresis.  She was started on beta blocker, Plavix, and aspirin post procedure.   The patient continued to have some mild generalized fatigue, and I just  feel weak symptoms, but no recurrence of chest pain.  The patient was  up walking the hall with cardiac rehab in the unit, and in the stepdown  unit as well.  The patient was transferred to telemetry over the weekend  and did well.  The patient's blood pressure was well-controlled and  telemetry revealed sinus bradycardia without any ectopy.   On the day of discharge, the patient was seen and examined by Dr.  Tonny Bollman and found to be stable for discharge with follow up  appointment to be completed to see Dr. Olga Millers on discharge for  continued cardiac management.   DISCHARGE LABS:  Hemoglobin 11.6, hematocrit 33.7, white blood cells  6.8, platelets 237,000.  Sodium 143, potassium 3.7, chloride 108, CO2  24, BUN 14, creatinine 0.8, glucose 161.  Troponin 25.94, 30.66, and  22.87 respectively.  Cholesterol 186, LDL 127, HDL 34, triglycerides  127.  EKG revealing sinus bradycardia, ventricular rate in the 50s with  lateral ST-T wave abnormality with T wave inversion noted V4, V5, and V6  with flattening noted in lead II.   VITAL SIGNS:  Blood pressure 110/57, heart rate 60, respirations 18,  temperature 97.7, with an O2 sat of 94% on room air.   DISCHARGE MEDICATIONS:  1. Lyrica 150 mg twice a day.  2. Aspirin 325 mg once a day.  3. Lipitor 80 mg once a day.  4. Plavix 75 mg daily.  5. Lopressor 50 mg twice a day.  6. Nitroglycerin 0.4 mg p.r.n. sublingually for chest pain.  7. Lasix 20 mg.  8. Potassium 20 mEq p.o. times 7 days  only.   ALLERGIES:  NO KNOWN DRUG ALLERGIES.   FOLLOW UP PLANS AND APPOINTMENTS:  1. The patient is to follow up with Dr. Olga Millers on Tuesday,      May 02, 2007 at 12:30 p.m.  2. The patient is to have a BMET drawn on Monday, May 01, 2007.  3. The patient is to follow up with Dr. Lillie Columbia in [facility] for      continued medical management.  4. The patient has been given post cardiac catheterization      instructions with particular references on the right groin site for      evidence of hematoma, pain, signs of infection, or bleeding.  5. The patient has been advised to return to [facility]      Emergency Room for recurrent chest pain not relieved with      nitroglycerin.   Time spent with the patient to include physician time is 45 minutes.      Bettey Mare. Lyman Bishop, NP      Veverly Fells. Excell Seltzer, MD  Electronically Signed    KML/MEDQ  D:  04/10/2007  T:  04/10/2007  Job:  [id number]   cc:   Stefanie Libel

## 2010-10-06 NOTE — Cardiovascular Report (Signed)
NAMEBERNARDINA, Santiago            ACCOUNT NO.:  [id number]   MEDICAL RECORD NO.:  [id number]          PATIENT TYPE:  INP   LOCATION:  2019                         FACILITY:  [facility]   PHYSICIAN:  Veverly Fells. Excell Seltzer, MD  DATE OF BIRTH:  [birth year]   DATE OF PROCEDURE:  DATE OF DISCHARGE:                            CARDIAC CATHETERIZATION   DATE OF BIRTH:  1926/03/03   PROCEDURE:  Left heart catheterization, selective coronary angiography,  left ventricular angiography, PTCA and stenting of the left circumflex  and LAD.   INDICATIONS:  Kara Santiago is a 77 year old woman who presented with a  non-ST-elevation MI.  She has no documented history of CAD.  In the  setting of her positive cardiac enzymes, she was referred for cardiac  catheterization.  Of note, her physical exam was consistent with aortic  stenosis and we plan on evaluating this during her catheterization as  well.   Risks and indications of the procedure were reviewed with the patient.  Informed consent was obtained.  The right groin was prepped, draped,  anesthetized with 1% lidocaine using the modified Seldinger technique.  A 6-French sheath was placed in the right femoral artery.  Standard 6-  [place] Judkins catheters were used for coronary angiography and angled  pigtail catheter was used for left ventriculography.  A pullback across  the aortic valve was done.   In completion of the diagnostic procedure, I elected to perform PCI on  the left circumflex and LAD.  The left circumflex had a 99% stenosis in  the midportion that appeared to be the culprit lesion.  The mid LAD also  had severe stenosis in the range of 80% to 90% and this was relatively  focal disease.  I reviewed the case with the patient's daughter who  works in the PACU here and I decided to proceed with planned PCI using  drug-eluting stents.  Heparin and Integrilin were used for  anticoagulation.  Once therapeutic ACT was achieved, a CLS 3.5-cm  guide  catheter was inserted.  I initially attempted to wire the left  circumflex with a cougar wire.  I was unable to place the wire into the  distal circumflex.  It continuously went into a branch just beyond the  severe stenosis.  I left that wire in place and used a whisper wire with  a moderate amount of difficulty to wire the lesion.  The proximal  circumflex was extremely tortuous.  Once the lesion was wired, a 2.5 x  12-mm balloon was used to predilate the lesion.  Multiple balloon  inflations were performed up to 8 atmospheres.  Following balloon  dilatation, the lesion was improved and there was TIMI III flow in the  vessel.  I elected to stent the lesion with a 3 x 23-mm Promus stent.  The Promus stent was deployed at 12 atmospheres.  Following stenting,  there was an excellent angiographic result.  There was TIMI III flow in  the vessel.  There was a mild waste in the midportion of the stent where  it appeared underexpanded.  I elected to postdilate with a  3 x 15-mm  Quantum Maverick up to 16 and an 18 atmospheres.  Following  postdilatation, there was improvement in the appearance of the mid part  of the stent.  There was TIMI III flow in the vessel.  At that point, I  elected to move on to the LAD.  The LAD was wired with a cougar  guidewire without difficulty.  The lesion was predilated with a 2.5 x 12-  mm Maverick balloon up to 8 atmospheres.  I then stented the area with  2.5 x 18-mm Promus stent which was deployed at 12 atmospheres.  The  stent was well-expanded.  There was no waste and there was TIMI III flow  in the vessel.  I elected to postdilate this with a 2.5 x 15-mm Quantum  Maverick.  This was taken to 18 atmospheres on a single inflation.  Following postdilatation, there was an excellent angiographic result.  Intracoronary nitroglycerin was given and final angiography demonstrated  excellent stent expansion with TIMI III flow throughout.  There was 0%   residual stenosis.   FINDINGS:  Aortic pressure 130/53 with a mean of 84, left ventricular  pressure 149/20.   CORONARY ANGIOGRAPHY:  The left main coronary artery has minor luminal  irregularities.  There is no significant angiographic stenosis.  The  left main bifurcates into the LAD and left circumflex.   The LAD is a large-caliber vessel that courses down and is very tortuous  in the mid and distal portions.  The LAD wraps around the apex.  The  midportion of the LAD after the first diagonal and first septal  perforator has an 80% to 90% focal stenosis that is irregular in  appearance.  The first diagonal branch has mild nonobstructive disease.   The left circumflex is tortuous.  It is of large caliber.  The mid  circumflex beyond the area of tortuosity has a 95% to 99% eccentric  stenosis.  It courses down and supplies a large left posterolateral  branch.   The right coronary artery is dominant.  The vessel is tortuous.  There  is minor nonobstructive disease in the proximal and midportions of 30%  and 20% respectively.  Distally it terminates in a PDA and  posterolateral branch.  There are no high-grade stenoses in the right  coronary artery.   The left ventricular function is normal.  The LV EF is estimated at 55%  to 60%.  There is mild mitral regurgitation.   ASSESSMENT:  1. Severe LAD stenosis with successful PCI using a drug-eluting stent.  2. Severe left circumflex stenosis with successful PCI using a single      drug-eluting stent.  As above, this was likely the culprit vessel.  3. Nonobstructive RCA stenosis.  4. Calcified aortic valve with 15-20 mm peak gradient, suggestive of      mild aortic stenosis.   Recommend dual antiplatelet therapy with aspirin and Plavix for a  minimum of 12 months.  Will continue Integrilin for 18 hours.  The  patient should be eligible for discharge in approximately 48 hours.  With her advanced age and requirement for Integrilin  until tomorrow, I  would favor keeping her until the day after.  Will continue with medical  therapy for her CAD.      Veverly Fells. Excell Seltzer, MD  Electronically Signed     MDC/MEDQ  D:  04/07/2007  T:  04/08/2007  Job:  [id number]

## 2010-10-06 NOTE — Assessment & Plan Note (Signed)
[facility]                            CARDIOLOGY OFFICE NOTE   MAGDALEN, CABANA                   MRN:          [id number]  DATE:02/08/2008                            DOB:          18-Oct-1925    Kara Santiago is a pleasant 77 year old female who has a history of  coronary disease, status post drug-eluting stent to the LAD as well as  circumflex in November 2008.  She also has mild aortic stenosis and mild-  to-moderate mitral regurgitation.  Since I last saw her, she was doing  well.  She denies any dyspnea, chest pain, palpitations or syncope.  There is no pedal edema.  She continues to have claudication and this is  being followed by Dr. Arbie Cookey.  He is also following her cerebrovascular  disease.   MEDICATIONS:  1. Plavix 75 mg p.o. daily.  2. Aspirin 81 mg p.o.  daily.  3. Lipitor 80 mg p.o. daily.  4. Lopressor 50 mg p.o. b.i.d.  5. Lisinopril 20 mg p.o. daily.  6. Lyrica 150 mg p.o. b.i.d.  7. Glipizide XL 5 mg p.o. daily.  8. Calcium.   PHYSICAL EXAMINATION:  VITAL SIGNS:  Blood pressure 190/80 and her pulse  is 53.  She weighs 118 pounds.  HEENT:  Normal.  NECK:  Supple.  CHEST:  Diminished breath sounds.  CARDIOVASCULAR:  Regular rhythm.  There is a 2/6 systolic murmur at the  left sternal border.  ABDOMEN:  No tenderness.  EXTREMITIES:  No edema.   Her electrocardiogram shows a sinus rhythm at a rate of 53.  There are  no significant ST changes.   DIAGNOSES:  1. Coronary artery disease, status post drug-eluting stent to the left      anterior descending and circumflex in November 2008 - the patient      is doing well with no chest pain.  She will continue on her      aspirin, Plavix, ACE inhibitor, beta-blocker and statin.  2. History of aortic stenosis/mild-to-moderate mitral regurgitation -      we will plan to repeat her echocardiogram.  3. Hyperlipidemia - she will continue on her statin.  We will have her      return  for fasting lipids and liver and adjust as indicated.  4. Tobacco abuse - now resolved.  5. Hypertension - her blood pressure is elevated today.  I have asked      her to increase her lisinopril to 40 mg p.o. daily and we will add      HCTZ 12.5 mg p.o. daily as well.  We will check a BMET in 1 week to      follow potassium and renal function.  6. Claudication - we will continue with medical therapy and this is      being followed by the CVTS.  7. Cerebrovascular disease - this is also being managed by CVTS.  8. Diabetes mellitus - management per her primary care physician.   She will see [place] back in 6 months.     Madolyn Frieze Jens Som, MD, [facility]  Electronically Signed  BSC/MedQ  DD: 02/08/2008  DT: 02/08/2008  Job #: [id number]

## 2010-10-06 NOTE — Procedures (Signed)
CAROTID DUPLEX EXAM   INDICATION:  Carotid disease.   HISTORY:  Diabetes:  No.  Cardiac:  MI, CABG.  Hypertension:  Yes.  Smoking:  No.  Previous Surgery:  No.  CV History:  Currently asymptomatic.  Amaurosis Fugax No, Paresthesias No, Hemiparesis No.                                       RIGHT             LEFT  Brachial systolic pressure:         142               140  Brachial Doppler waveforms:         Normal            Normal  Vertebral direction of flow:        Antegrade         Antegrade  DUPLEX VELOCITIES (cm/sec)  CCA peak systolic                   92                84  ECA peak systolic                   183               100  ICA peak systolic                   117               231  ICA end diastolic                   26                56  PLAQUE MORPHOLOGY:                  Mixed             Mixed  PLAQUE AMOUNT:                      Mild              Moderate  PLAQUE LOCATION:                    ICA/ECA           ICA   IMPRESSION:  1. A 1% to 39% stenosis of the right internal carotid artery.  2. Low-end 60% to 79% stenosis of the left internal carotid artery.  3. No significant change noted when compared to the previous exam on      06/13/2008.   ___________________________________________  P. Liliane Bade, M.D.   CH/MEDQ  D:  12/11/2008  T:  12/12/2008  Job:  [id number]

## 2010-10-06 NOTE — Assessment & Plan Note (Signed)
OFFICE VISIT   Kara Santiago, Kara Santiago  DOB:  1926/05/08                                       01/31/2008  ZOXWR#:[id number]   The patient presents today for concern regarding lower extremity  discomfort.  I had seen her 1 year ago for concern regarding her lower  extremity varicosities.  She does have significant bilateral lower  extremity varicosities and saphenous vein reflux bilaterally and was  currently comfortable with her level of discomfort.  I asked her to  return should she develop any increased difficulty.  She presents today  reporting increased discomfort in her legs bilaterally.  Her symptoms  currently are calf discomfort with walking, especially uphill.  I had a  long discussion with the patient and her daughter, Anitra Lauth, who  is a recovery room nurse at [employer].  I explained that this  sound really more like arterial insufficiency than venous insufficiency.   On physical exam she is an alert, oriented white female appearing  younger than her stated age of 75.  She does have marked varicosities in  both lower extremities with no ulceration or skin changes.  She does  have palpable dorsalis pedis pulses bilaterally.   I recommended that we undergo noninvasive arterial studies to determine  if indeed she does have arterial insufficiency.  She is unable to walk  on the treadmill, however, her resting studies revealed an ankle arm  index of 0.67 bilaterally.  I discussed this with the patient and  explained that her symptoms do appear to be arterial in nature and not  related to her venous varicosities.  I explained that the next step for  evaluation would be arteriogram for further evaluation.  She  specifically denies any buttocks or thigh claudication.  I explained  that she does not have limb threatening ischemia and that I would feel  comfortable with observation only if she is comfortable with her current  level of  claudication.  She reports that she is.  She will notify us  should she develop any increased walking difficulty, otherwise will  continue with her carotid protocol followup and is being directed by Dr.  Liliane Bade.   Larina Earthly, M.D.  Electronically Signed   TFE/MEDQ  D:  01/31/2008  T:  02/01/2008  Job:  [id number]   cc:   Dr Carloyn Jaeger, M.D.

## 2010-10-06 NOTE — Assessment & Plan Note (Signed)
[facility]                            CARDIOLOGY OFFICE NOTE   RAYAH, FINES                   MRN:          [id number]  DATE:05/02/2007                            DOB:          Feb 14, 1926    Ms. Lacock is a pleasant 77 year old female that was recently admitted  to [facility] and ruled in for myocardial infarction with  serial enzymes.  She subsequently had a drug-eluting stent to the LAD as  well as circumflex.  Note the LV function was normal with an estimated  ejection fraction of 55-60%.  By cardiac catheterization there was mild  aortic stenosis.  Note the patient also had an echocardiogram when she  was in the hospital that showed normal LV function.  There was moderate  aortic stenosis per the echocardiogram report with mild aortic  insufficiency and mild to moderate mitral regurgitation.  There was  moderate left atrial enlargement.  The mean gradient was 21 mmHg.  Since  discharge, she has done well.  She denies any dyspnea, chest pain,  palpitations or syncope.   Her present medications include:  1. Plavix 75 mg p.o. daily.  2. Aspirin 81 mg p.o. daily.  3. Lipitor 80 mg p.o. daily.  4. Lopressor 50 mg p.o. b.i.d.  5. Lyrica 150 mg p.o. daily.   PHYSICAL EXAM TODAY:  Shows a blood pressure 155/67 and her pulse was  57.  HEENT:  Normal.  NECK:  Supple with bilateral bruits.  CHEST:  Clear.  CARDIOVASCULAR:  Reveals a regular rate and rhythm.  ABDOMEN:  Shows no tenderness.  Her right groin shows no hematoma and no  bruit.  EXTREMITIES:  Show no edema.   DIAGNOSES:  1. Status post recent myocardial infarction with a drug-eluting stent      to the left anterior descending artery and circumflex - the patient      will continue on her aspirin and Plavix as well as her beta blocker      and statin.  2. Aortic stenosis - she has no symptoms from this and we will plan to      repeat her echocardiogram in 1 year.  3. Hyperlipidemia - she will need followup lipids and liver in      approximately 4 weeks given the recent initiation of Lipitor.  Our      goal LDL would be less than 70 given her history of coronary      disease.  4. Tobacco abuse - she has discontinued this.  5. Cardiovascular disease - this is being followed by Dr. Madilyn Fireman in      vascular surgery.  6. Hypertension - her blood pressure is mildly elevated today.  I have      asked her to track this at home and we will increase her      medications or add as needed.  7. Diabetes mellitus - per her primary care physician.   I will see her back in approximately 3 months.     Madolyn Frieze Jens Som, MD, [facility]  Electronically Signed    BSC/MedQ  DD: 05/02/2007  DT: 05/02/2007  Job #: [id number]

## 2010-10-06 NOTE — Procedures (Signed)
CAROTID DUPLEX EXAM   INDICATION:  Follow-up known carotid artery disease.   HISTORY:  Diabetes:  No  Cardiac:  MI, CABG  Hypertension:  Yes  Smoking:  No  Previous Surgery:  No  CV History:  No  Amaurosis Fugax No, Paresthesias No, Hemiparesis No                                       RIGHT             LEFT  Brachial systolic pressure:         150               150  Brachial Doppler waveforms:         WNL               WNL  Vertebral direction of flow:        Antegrade         Antegrade  DUPLEX VELOCITIES (cm/sec)  CCA peak systolic                   86                69  ECA peak systolic                   241               80  ICA peak systolic                   111 mid           227  ICA end diastolic                   19                43  PLAQUE MORPHOLOGY:                  Heterogeneous  Calcific/heterogeneous  PLAQUE AMOUNT:                      Mild              Moderate  PLAQUE LOCATION:                    ECA               ICA   IMPRESSION:  1. Right internal carotid artery suggests 1%-39% stenosis.  2. Left internal carotid artery suggests 60%-79% stenosis.  3. Right external carotid artery stenosis.  4. Antegrade flow in bilateral vertebrals.   ___________________________________________  Larina Earthly, M.D.   CB/MEDQ  D:  01/16/2010  T:  01/16/2010  Job:  [id number]

## 2010-10-06 NOTE — Procedures (Signed)
CAROTID DUPLEX EXAM   INDICATION:  Follow-up evaluation of known carotid artery disease.   HISTORY:  Diabetes:  Orally controlled.  Cardiac:  MI in 03/2007, coronary artery bypass graft.  Hypertension:  Yes.  Smoking:  No.  Previous Surgery:  No.  CV History:  Previous duplex on 06/22/2007 revealed 40-59% right ICA  stenosis and 60-79% left ICA stenosis, right ECA stenosis.  Amaurosis Fugax No, Paresthesias No, Hemiparesis No.                                       RIGHT             LEFT  Brachial systolic pressure:         138               140  Brachial Doppler waveforms:         Triphasic         Triphasic  Vertebral direction of flow:        Antegrade         Antegrade  DUPLEX VELOCITIES (cm/sec)  CCA peak systolic                   75                64  ECA peak systolic                   212               110  ICA peak systolic                   118               244  ICA end diastolic                   30                56  PLAQUE MORPHOLOGY:                  Calcified         Calcified  PLAQUE AMOUNT:                      Moderate          Moderate  PLAQUE LOCATION:                    Proximal ICA      Mid ICA   IMPRESSION:  1. 40-59% right internal carotid artery stenosis.  2. 40-59% left internal carotid artery stenosis (high end of range).  3. Right external carotid artery stenosis.  4. No significant change from previous study.   ___________________________________________  P. Liliane Bade, M.D.   MC/MEDQ  D:  06/13/2008  T:  06/13/2008  Job:  [id number]

## 2010-10-06 NOTE — Assessment & Plan Note (Signed)
OFFICE VISIT   Kara Santiago, Kara Santiago  DOB:  01-31-1926                                       02/01/2007  [id number]#:[id number]   The patient presents today for evaluation of left leg venous  varicosities.  She is here today with her daughter, who is an Charity fundraiser at the  recovery room at [facility].  She had a progressive history of pain, burning,  discomfort in her left calf with prolonged standing.  She has multiple  tributary varicosities over this area.  She has not had any history of  deep vein thrombosis, superficial thrombophlebitis, or bleeding from  these.  She does have pain and burning, itching, and stinging over these  areas after prolonged standing and yard work.  The varicosities are most  prominent in her left medial to distal calf, extending down onto her  ankles.  She has worn compression garments for 3 weeks since these were  prescribed by Dr. Madilyn Fireman on 08/21 and has had some discomfort with the  stockings, but is attempting to wear these.   PAST MEDICAL HISTORY:  Significant for shingles.  She does have a  history of asymptomatic carotid disease.  She is non-insulin dependent  diabetic.  She has no cardiac history.  Does have hypertension.   PHYSICAL EXAM:  Well-developed, well-nourished white female appearing  younger than stated age at 49.  Blood pressure is 140/80, pulse 68,  respirations 16.  Her radial pulses are 2+.  She has 2+ dorsalis pedis  pulses bilaterally.  She is thin and has very prominent surface veins  bilaterally.  She does have easily visualized saphenous vein running  throughout her calf bilaterally and does have tributary varicosities  over her posterior medial calf, extending down onto her medial ankle on  the left.   She underwent hand-held duplex by me showing reflux in her saphenous  veins bilaterally.  I discussed options with this patient.  I have  recommended that she continue her regimen of her graduated compression  garments  and will see me on a p.r.n. basis.  She is currently  comfortable with this level of discomfort.  She will have a duplex of  her carotids in October and we will perform a forma duplex of her left  leg venous duplex at that setting as well.   Larina Earthly, M.D.  Electronically Signed   TFE/MEDQ  D:  02/01/2007  T:  02/02/2007  Job:  [id number]   cc:   Dr. Dede Query

## 2010-10-06 NOTE — Assessment & Plan Note (Signed)
OFFICE VISIT   Kara Santiago, Kara Santiago  DOB:  30-May-1925                                       01/12/2007  [id number]#:[id number]   The patient is seen today with her daughter [name] regarding varicose  veins.  She notes discomfort, burning and swelling more severely  affecting her left leg.  There was no history of deep venous thrombosis.  She has never received anticoagulant therapy.  Denies bleeding or  ulceration.   Evaluation reveals a well-appearing 77 year old female.  Blood pressure  147/71, pulse 67 per minute.  Lower extremities reveal 2+ dorsalis pedis  pulses bilaterally.  Her left leg reveals greater saphenous  incompetence.  A large tuft of varicosities at the medial malleolus.  No  pigmentation.   Will being initial treatment with compression stockings.  Follow up with  Dr. Hart Rochester early in 4-6 weeks.   Balinda Quails, M.D.  Electronically Signed   PGH/MEDQ  D:  01/12/2007  T:  01/14/2007  Job:  [id number]

## 2010-10-06 NOTE — Procedures (Signed)
CAROTID DUPLEX EXAM   INDICATION:  Follow-up evaluation of known carotid artery disease.   HISTORY:  Diabetes:  Orally controlled.  Cardiac:  MI in November, 2008.  Coronary artery bypass graft.  Hypertension:  Yes.  Smoking:  No.  Previous Surgery:  No.  CV History:  Previous duplex on 06/22/07 revealed 20-39% right ICA  stenosis, 60-79% left ICA stenosis, and a right ECA stenosis.  Amaurosis Fugax No, Paresthesias No, Hemiparesis No.                                       RIGHT             LEFT  Brachial systolic pressure:         162               164  Brachial Doppler waveforms:         Triphasic         Triphasic  Vertebral direction of flow:        Antegrade         Antegrade  DUPLEX VELOCITIES (cm/sec)  CCA peak systolic                   89                87  ECA peak systolic                   279               119  ICA peak systolic                   119               254  ICA end diastolic                   31                69  PLAQUE MORPHOLOGY:                  Calcified         Mixed  PLAQUE AMOUNT:                      Mild              Moderate  PLAQUE LOCATION:                    Proximal ICA      Proximal ICA   IMPRESSION:  1. 40-59% right internal carotid artery stenosis.  2. 60-79% left internal carotid artery stenosis.  3. Right external carotid artery stenosis.  4. No significant change from previous study performed on 06/22/07.   ___________________________________________  P. Liliane Bade, M.D.   MC/MEDQ  D:  12/14/2007  T:  12/14/2007  Job:  [id number]

## 2010-10-06 NOTE — Procedures (Signed)
CAROTID DUPLEX EXAM   INDICATION:  Followup of known carotid artery disease.  Patient is  asymptomatic.   HISTORY:  Diabetes:  Yes, orally controlled.  Cardiac:  MI in November, 2008 with stent placement.  Hypertension:  Yes.  Smoking:  No.  Previous Surgery:  Cardiac.  CV History:  Amaurosis Fugax No, Paresthesias No, Hemiparesis No                                       RIGHT             LEFT  Brachial systolic pressure:         176               178  Brachial Doppler waveforms:         WNL               WNL  Vertebral direction of flow:        Antegrade         Antegrade  DUPLEX VELOCITIES (cm/sec)  CCA peak systolic                   82                71  ECA peak systolic                   227               95  ICA peak systolic                   94                P (125), M (223)  ICA end diastolic                   22                P (25), M (52)  PLAQUE MORPHOLOGY:                  Calcific          Mixed, calcific  PLAQUE AMOUNT:                      Mild              Moderate  PLAQUE LOCATION:                    ICA, ECA          Bifurcation, ICA   IMPRESSION:  1. 1-39% internal carotid artery stenosis.  2. 60-79% internal carotid artery stenosis with highest velocity taken      at approximately 1.93 cm from the bifurcation.  3. Right external carotid artery stenosis.  4. Study essentially unchanged from that on 01/18/05.   ___________________________________________  P. Liliane Bade, M.D.   PB/MEDQ  D:  06/22/2007  T:  06/22/2007  Job:  [id number]

## 2010-10-06 NOTE — H&P (Signed)
NAMEBRENISHA, Kara Santiago            ACCOUNT NO.:  [id number]   MEDICAL RECORD NO.:  [id number]          PATIENT TYPE:  EMS   LOCATION:  ED                            FACILITY:  [facility]   PHYSICIAN:  Pricilla Riffle, MD, FACCDATE OF BIRTH:  Mar 13, 1926   DATE OF ADMISSION:  04/06/2007  DATE OF DISCHARGE:                              HISTORY & PHYSICAL   PRIMARY CARE PHYSICIAN:  Dr. Lillie Columbia.   TOTAL VISIT TIME:  Approximately 50 minutes.   CHIEF COMPLAINT:  The patient is transferred from [facility] with chest  pain.  The patient was a good historian.  She is accompanied by her  daughter.   HISTORY OF PRESENT ILLNESS:  Kara Santiago is a 77 year old female with a  history of hypertension and a heart murmur, who notes approximately 1/2  weeks ago chest pain and chest pressure began on March 25, 2007.  She  notes it was mid substernal, anterior, a pressure sensation with  radiation to both arms and then to the neck as well, it lasted a minute  at that time.  It occurred at rest.  It was not associated with  shortness of breath, sweats or nausea.  The patient, however, notes over  the last 2 weeks she has been having similar episodes again, at a minute  at a time and is on an average of 2 to 3 times a day, mostly at rest.  The patient notes, however, on the day prior to admission, on April 06, 2007, she had 4 to 5 episodes of chest pressure, a similar quality,  but it was in a range of 10/10 at approximately 7:00 p.m., associated  with shortness of breath.  No association with sweats or nausea and  vomiting, but it did prompt her to be seen in the emergency room.  The  patient has not been taking her blood pressure medications for the last  2 years and when she saw her primary care physician a year ago, she did  not notify her doctor of this.  She also has been not taking her  diabetes medicines for the last 2 years as well.  She has not been  checking her blood pressure.  She notes some  subjective fevers last  night, with no chills or sweats.  No dysuria.  No rash.  No hematuria.  She denies any nosebleeds.  She denies any headaches.  The patient  denies any signs of claudication.  No syncope, paroxysmal nocturnal  dyspnea or orthopnea.  She denies having any lower extremity edema and  no palpitations.  The patient, at the time of the interview, had no  chest pain.  In the emergency room, at [facility], she was given heparin  bolus and drip, aspirin 324, morphine 2 mg IV and nitroglycerin  sublingual x2.  She was also given oxygen, IV fluids and Levall 20 mg  IV.  The patient's blood pressure, on presentation initially, was  209/92, since decreased to 120 systolic.   PAST MEDICAL HISTORY:  No prior catheterization.  No history of coronary  artery bypass grafting.  She notes  she may have had an echo in the past  to assess her heart sounds.  Medical problems, also include:  1. Diabetes.  She notes for the last 3 years and she was on medicine      for it, never on insulin.  2. History of hypertension, long standing.  3. She has a history of shingles in the left lumbar area 3 years ago      since placed on Lyrica.  4. In 1952 she had a hysterectomy for heavy bleeding after pregnancy.      She notes she still has her ovaries.  5. In 1998, she had a tympanomastoidectomy on the right for a tumor.  6. History of a heart murmur, not otherwise specified per patient.  7. History of bilateral hearing loss.  8. History of depression.   SOCIAL HISTORY:  She lives in [place] with her daughter, Satira Mccallum.  She is widowed.  She is a retired Child psychotherapist.  She has 3  children.  She smokes 5 cigarettes a day, she has been doing so for the  last 4 years, never smoked prior to that.  She drinks wine every 2  nights or so, 1 glass.   FAMILY HISTORY:  Positive for the mother who passed away shortly after  heart surgery in her 24s.  Father died of pneumonia at age 71.  She has  a  sister that passed away at 61 shortly after heart surgery.   REVIEW OF SYSTEMS:  Fourteen point review of systems negative, other  than stated above.   MEDICATIONS:  She is on:  1. Lyrica 150 mg by mouth twice a day.  2. She notes she occasionally takes baby aspirin.   ALLERGIES:  NO KNOWN DRUG ALLERGIES.   PHYSICAL EXAMINATION:  VITAL SIGNS:  Temperature is afebrile with a  pulse of 69, respiratory rate of 14, blood pressure 112/43.  Her weight,  the patient notes she is approximately 95 pounds.  Saturations are 100%  on 2 liters nasal cannula.  GENERAL:  She is a thin female, looks younger than her stated age, lying  flat in bed in no acute distress.  HEENT:  Normocephalic, atraumatic.  Her pupils are equal, round and  reactive to light.  Her extraocular movements are intact.  Sclerae are  clear.  Moist mucous membranes.  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  No posterior or pharyngeal lesions.  NECK:  Supple with no lymphadenopathy or thyromegaly.  There are no  bruits.  No signs of jugular venous distention.  No anterior or cervical  lymphadenopathy.  CARDIOVASCULAR EXAM:  Regular rhythm and rate with normal S1, S2.  No S3  or S4.  She has a 2-3/6 early systolic murmur with radiation at the apex  with radiation to the base and to the right side, greater than the left,  consistent with an aortic stenosis murmur.  LUNGS:  Clear to auscultation bilaterally.  ABDOMEN:  Soft, nontender, nondistended with no signs of  hepatosplenomegaly.  EXTREMITIES:  Show no clubbing, cyanosis or edema.  There are no signs  of rashes, lesions or petechiae.  MUSCULOSKELETAL EXAM:  Reveals no major joint deformities.  She does  have some changes in the joints of her hands, consistent with possible  degenerative joint disease.  No CVA tenderness.  NEUROLOGIC EXAM:  She is alert and oriented x4 with cranial nerves II-  XII grossly intact.  Strength and sensation  grossly  intact with an  unremarkable Babinski.   Chest x-ray shows no acute cardiopulmonary disease.  There is no  cardiomegaly.  The EKG shows normal sinus rhythm other than a rate of  94.  Normal axis with intervals at the PR of 144, QRS 100 and QT  corrected at 477.  There was a nonspecific ST/T changes with signs of  possible hypertrophy with secondary repolarization.  No prior EKG  available.   LABORATORY DATA:  Shows a white count of 8.0, hemoglobin 13.6,  hematocrit 41.0, platelets 312 with a normal differential.  Sodium of  141, potassium 3.9, chloride 109, bicarb 25, BUN 12, creatinine 0.77,  glucose 202.  At 2140 at [facility] ER, cardiac markers, CK-MB was 5.7,  a troponin elevated 0.98, calcium was 9.0.   ASSESSMENT/PLAN:  This is a patient with possible unstable angina over  the last 2 weeks, who now presents with possible non-ST elevation  myocardial infarction, rule out pulmonary embolus.  She also has  hypertensive urgency.   1. For her possible non-ST elevation myocardial infarction, she will      be admitted to the CICU and will follow cardiac markers and EKG      serially.  She will be in telemetry.  She will receive aspirin      daily, as well as heparin drip, nitroglycerin drip and Lopressor.      We will start IV Integrilin if her cardiac makers increase      substantially and we will hold n.p.o. after midnight for a possible      assessment of her coronary blood flow.  She will also need      assessment of her ejection fraction.  2. For her hypertensive urgency, she is now on Lopressor,      nitroglycerin drip and we will start a low dose Ramipril.  3. To rule out pulmonary embolus, she is currently on a heparin drip,      but her cardiac catheterization is unremarkable, consider      evaluation of this.  4. For her diabetes, we will check a hemoglobin A1c and place her on a      sliding scale insulin to control her blood sugar, ADA diet and      remain  n.p.o.  We will follow her blood sugars closely.  5. For her heart murmur, possible aortic stenosis, tomorrow we will      get an echocardiogram.  6. For her tobacco use, counseling on discountenance and she notes she      will stop smoking.  7. Deep venous thrombosis prophylaxis.  She is on heparin drip.  8. Gastrointestinal prophylaxis.  We will put her on a proton pump      inhibitor.      Darryl D. Prime, MD   Electronically Signed     ______________________________  Pricilla Riffle, MD, [facility]    DDP/MEDQ  D:  04/07/2007  T:  04/07/2007  Job:  [id number]

## 2011-01-19 ENCOUNTER — Ambulatory Visit (INDEPENDENT_AMBULATORY_CARE_PROVIDER_SITE_OTHER): Payer: Medicare Other | Admitting: Vascular Surgery

## 2011-01-19 DIAGNOSIS — I6529 Occlusion and stenosis of unspecified carotid artery: Secondary | ICD-10-CM

## 2011-01-19 NOTE — Progress Notes (Signed)
Study performed 01/19/2011 [facility] 

## 2011-01-27 ENCOUNTER — Encounter: Payer: Self-pay | Admitting: Vascular Surgery

## 2011-01-27 NOTE — Procedures (Unsigned)
CAROTID DUPLEX EXAM  INDICATION:  Followup carotid stenosis.  HISTORY: Diabetes:  No. Cardiac:  MI, CABG. Hypertension:  Yes. Smoking:  Previous. Previous Surgery:  No carotid intervention. CV History:  Asymptomatic. Amaurosis Fugax No, Paresthesias No, Hemiparesis No                                      RIGHT             LEFT Brachial systolic pressure:         172               170 Brachial Doppler waveforms:         WNL               WNL Vertebral direction of flow:        Antegrade         Antegrade DUPLEX VELOCITIES (cm/sec) CCA peak systolic                   82                90 ECA peak systolic                   216               108 ICA peak systolic                   111               263 ICA end diastolic                   25                51 PLAQUE MORPHOLOGY:                  Heterogeneous     Calcified PLAQUE AMOUNT:                      Mild              Moderate PLAQUE LOCATION:                    CCA/ICA/ECA       CCA/ICA/ECA  IMPRESSION: 1. Right internal carotid artery stenosis present in the 1%-39% range. 2. Left internal carotid artery stenosis in the 40%-59% range, this is     downgraded since study on 07/21/2010, however, velocities are     essentially unchanged. 3. Bilateral external carotid artery stenosis present. 4. Bilateral vertebral arteries are patent and antegrade.  ___________________________________________ Larina Earthly, M.D.  SH/MEDQ  D:  01/19/2011  T:  01/19/2011  Job:  [id number]

## 2011-01-29 ENCOUNTER — Encounter: Payer: Self-pay | Admitting: Vascular Surgery

## 2011-02-02 ENCOUNTER — Other Ambulatory Visit: Payer: Self-pay | Admitting: Lab

## 2011-02-02 DIAGNOSIS — I6529 Occlusion and stenosis of unspecified carotid artery: Secondary | ICD-10-CM

## 2011-02-02 DIAGNOSIS — Z48812 Encounter for surgical aftercare following surgery on the circulatory system: Secondary | ICD-10-CM

## 2011-02-09 ENCOUNTER — Other Ambulatory Visit: Payer: Self-pay | Admitting: Cardiology

## 2011-03-02 LAB — BASIC METABOLIC PANEL
CO2: 25
Calcium: 8.7
Chloride: 108
Chloride: 109
Creatinine, Ser: 0.73
GFR calc Af Amer: >60
GFR calc Af Amer: >60
GFR calc non Af Amer: >60
GFR calc non Af Amer: >60
Glucose, Bld: 161 — ABNORMAL HIGH
Potassium: 3.7
Potassium: 3.9
Sodium: 142
Sodium: 143
Sodium: 141

## 2011-03-02 LAB — COMPREHENSIVE METABOLIC PANEL
ALT: 20
AST: 62 — ABNORMAL HIGH
AST: 79 — ABNORMAL HIGH
Albumin: 2.8 — ABNORMAL LOW
Alkaline Phosphatase: 79
Alkaline Phosphatase: 80
BUN: 12
CO2: 29
Chloride: 108
Creatinine, Ser: 0.68
GFR calc Af Amer: >60
GFR calc Af Amer: >60
GFR calc non Af Amer: >60
Glucose, Bld: 93
Potassium: 4.3
Potassium: 4
Sodium: 141
Total Bilirubin: 0.4
Total Protein: 5.6 — ABNORMAL LOW

## 2011-03-02 LAB — DIFFERENTIAL
Eosinophils Absolute: 0.1 — ABNORMAL LOW
Eosinophils Relative: 1
Lymphocytes Relative: 33
Lymphs Abs: 2.6
Monocytes Absolute: 0.6
Monocytes Relative: 7

## 2011-03-02 LAB — MAGNESIUM
Magnesium: 2.2
Magnesium: 2.2

## 2011-03-02 LAB — CARDIAC PANEL(CRET KIN+CKTOT+MB+TROPI)
CK, MB: 45.7 — ABNORMAL HIGH
CK, MB: 155.6 — ABNORMAL HIGH
CK, MB: 58.1 — ABNORMAL HIGH
Relative Index: 6.8 — ABNORMAL HIGH
Relative Index: 11.1 — ABNORMAL HIGH
Relative Index: 11.4 — ABNORMAL HIGH
Relative Index: 13.7 — ABNORMAL HIGH
Relative Index: 7.2 — ABNORMAL HIGH
Total CK: 672 — ABNORMAL HIGH
Total CK: 1137 — ABNORMAL HIGH
Troponin I: 11.2
Troponin I: 25.94

## 2011-03-02 LAB — CBC
HCT: 32.3 — ABNORMAL LOW
HCT: 33.2 — ABNORMAL LOW
HCT: 34.4 — ABNORMAL LOW
HCT: 41
Hemoglobin: 10.9 — ABNORMAL LOW
Hemoglobin: 11.6 — ABNORMAL LOW
Hemoglobin: 11.6 — ABNORMAL LOW
Hemoglobin: 13.6
MCHC: 34.2
MCV: 93.6
Platelets: 237
Platelets: 237
Platelets: 251
RBC: 3.36 — ABNORMAL LOW
RBC: 3.65 — ABNORMAL LOW
RBC: 4.39
RDW: 13.6
RDW: 13.9
RDW: 14.4
RDW: 14.8
WBC: 5.7
WBC: 6.6
WBC: 7.7
WBC: 8

## 2011-03-02 LAB — HEMOGLOBIN A1C
Hgb A1c MFr Bld: 6.1
Mean Plasma Glucose: 140
Mean Plasma Glucose: 140

## 2011-03-02 LAB — LIPID PANEL
Total CHOL/HDL Ratio: 5.1
Triglycerides: 127
VLDL: 25
VLDL: 16

## 2011-03-02 LAB — URINALYSIS, MICROSCOPIC ONLY
Nitrite: NEGATIVE
Specific Gravity, Urine: 1.017
Urobilinogen, UA: 0.2

## 2011-03-02 LAB — TSH: TSH: 2.119

## 2011-03-02 LAB — HEPARIN LEVEL (UNFRACTIONATED): Heparin Unfractionated: 0.1 — ABNORMAL LOW

## 2011-03-02 LAB — PROTIME-INR: INR: 1.1

## 2011-03-02 LAB — POCT CARDIAC MARKERS
Operator id: 221061
Troponin i, poc: 0.98

## 2011-03-02 LAB — APTT: aPTT: 115 — ABNORMAL HIGH

## 2011-03-22 ENCOUNTER — Other Ambulatory Visit: Payer: Self-pay | Admitting: Cardiology

## 2011-04-06 ENCOUNTER — Ambulatory Visit (INDEPENDENT_AMBULATORY_CARE_PROVIDER_SITE_OTHER): Payer: Medicare Other | Admitting: Cardiology

## 2011-04-06 ENCOUNTER — Encounter: Payer: Self-pay | Admitting: Cardiology

## 2011-04-06 DIAGNOSIS — I2510 Atherosclerotic heart disease of native coronary artery without angina pectoris: Secondary | ICD-10-CM

## 2011-04-06 DIAGNOSIS — J069 Acute upper respiratory infection, unspecified: Secondary | ICD-10-CM

## 2011-04-06 DIAGNOSIS — I359 Nonrheumatic aortic valve disorder, unspecified: Secondary | ICD-10-CM

## 2011-04-06 DIAGNOSIS — I350 Nonrheumatic aortic (valve) stenosis: Secondary | ICD-10-CM

## 2011-04-06 MED ORDER — AZITHROMYCIN 250 MG PO TABS: ORAL_TABLET | ORAL | Status: AC

## 2011-04-06 NOTE — Patient Instructions (Signed)
Your physician has requested that you have an echocardiogram in May. Echocardiography is a painless test that uses sound waves to create images of your heart. It provides your doctor with information about the size and shape of your heart and how well your heart's chambers and valves are working. This procedure takes approximately one hour. There are no restrictions for this procedure.  Your physician wants you to follow-up in: May after the echocardiogarm.   You will receive a reminder letter in the mail two months in advance. If you don't receive a letter, please call our office to schedule the follow-up appointment.  Your physician has recommended you make the following change in your medication: Zpack

## 2011-04-06 NOTE — Progress Notes (Signed)
VHQ:IONGEXBM female with past medical history of coronary artery disease status post drug-eluting stent to the LAD and circumflex in November of 2008, AS/AI. Last echocardiogram was performed in May of 2012. There was normal LV function. There was moderate aortic stenosis with a mean gradient of 31 mmHg; mild AI. There was mild mitral regurgitation. Myoview in November of 2010 showed inferolateral thinning but no ischemia. Ejection fraction was 58%. I last saw her in May of 2012. Since then she does have dyspnea with moderate activities but not with routine activities. It is relieved with rest. There is no associated chest pain. No orthopnea, pedal edema, palpitations, syncope or exertional chest pain.   Current Outpatient Prescriptions  Medication Sig Dispense Refill  . aspirin 81 MG tablet Take 81 mg by mouth daily.        Marland Kitchen atorvastatin (LIPITOR) 80 MG tablet 1 TAB BY MOUTH ONCE DAILY  30 tablet  4  . glipiZIDE (GLUCOTROL) 5 MG tablet Take 5 mg by mouth daily.        Marland Kitchen lisinopril (PRINIVIL,ZESTRIL) 40 MG tablet Take 40 mg by mouth daily.        . metoprolol (LOPRESSOR) 50 MG tablet TAKE ONE TABLET BY MOUTH TWICE DAILY  180 tablet  3     Past Medical History  Diagnosis Date  . Aortic stenosis   . CAD (coronary artery disease)   . CVD (cerebrovascular disease)   . Diabetes mellitus type 1   . Hyperlipidemia   . Hypertension   . PVD (peripheral vascular disease)   . Mitral regurgitation   . Aortic insufficiency     Past Surgical History  Procedure Date  . Tympanomastoidectomy 2002    right  . Cardiac catheterization 04/07/07    By Dr. Excell Seltzer -- 1. Severe LAD stenosis with successful PCI using a drug-eluting stent. 2. Severe left circumflex stenosis with successful PCI using a single  drug-eluting stent.    History   Social History  . Marital Status: Widowed    Spouse Name: N/A    Number of Children: N/A  . Years of Education: N/A   Occupational History  . 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 Smoking status: Former Games developer  . Smokeless tobacco: Not on file  . Alcohol Use: Yes  . Drug Use: No  . Sexually Active: Not on file   Other Topics Concern  . Not on file   Social History Narrative  . No narrative on file    ROS: complains of productive cough and URI symptoms but hemoptysis, dysphasia, odynophagia, melena, hematochezia, dysuria, hematuria, rash, seizure activity, orthopnea, PND, pedal edema, claudication. Remaining systems are negative.  Physical Exam: Well-developed well-nourished in no acute distress.  Skin is warm and dry.  HEENT is normal.  Neck is supple. No thyromegaly.  Chest is clear to auscultation with normal expansion.  Cardiovascular exam is regular rate and rhythm. 3/6 systolic murmur left sternal border. Abdominal exam nontender or distended. No masses palpated. Extremities show no edema. neuro grossly intact  ECG sinus bradycardia at a rate of 81. Left axis deviation. Left ventricular hypertrophy. Nonspecific T-wave changes.

## 2011-04-06 NOTE — Assessment & Plan Note (Signed)
No change in symptoms. Plan repeat echocardiogram in May of 2013. I have explained that she will most likely require aortic valve replacement in the future.

## 2011-04-06 NOTE — Assessment & Plan Note (Signed)
Blood pressure elevated but she has not taken her medications today. She will monitor at home and we will increase as needed.

## 2011-04-06 NOTE — Assessment & Plan Note (Signed)
Continue aspirin, statin and ACE inhibitor.

## 2011-04-06 NOTE — Assessment & Plan Note (Signed)
Continue statin. 

## 2011-04-06 NOTE — Assessment & Plan Note (Signed)
Followed by vascular surgery.Continue aspirin and statin. 

## 2011-04-06 NOTE — Assessment & Plan Note (Signed)
z pack prescribed 

## 2011-10-26 ENCOUNTER — Telehealth: Payer: Self-pay | Admitting: Cardiology

## 2011-10-26 NOTE — Telephone Encounter (Signed)
New Problem:     I called the patient and was unable to reach them. I left a message on their voicemail with my name, the reason I called, the name of his physician, and a number to call back to schedule their appointment. 

## 2011-12-23 ENCOUNTER — Ambulatory Visit (INDEPENDENT_AMBULATORY_CARE_PROVIDER_SITE_OTHER): Payer: Medicare Other | Admitting: Cardiology

## 2011-12-23 ENCOUNTER — Ambulatory Visit (HOSPITAL_COMMUNITY): Payer: Medicare Other | Attending: Internal Medicine

## 2011-12-23 ENCOUNTER — Encounter: Payer: Self-pay | Admitting: Cardiology

## 2011-12-23 VITALS — BP 160/90 | HR 49 | Ht 61.0 in | Wt 99.4 lb

## 2011-12-23 DIAGNOSIS — I2510 Atherosclerotic heart disease of native coronary artery without angina pectoris: Secondary | ICD-10-CM

## 2011-12-23 DIAGNOSIS — I379 Nonrheumatic pulmonary valve disorder, unspecified: Secondary | ICD-10-CM | POA: Insufficient documentation

## 2011-12-23 DIAGNOSIS — I359 Nonrheumatic aortic valve disorder, unspecified: Secondary | ICD-10-CM

## 2011-12-23 DIAGNOSIS — E119 Type 2 diabetes mellitus without complications: Secondary | ICD-10-CM | POA: Insufficient documentation

## 2011-12-23 DIAGNOSIS — E785 Hyperlipidemia, unspecified: Secondary | ICD-10-CM | POA: Insufficient documentation

## 2011-12-23 DIAGNOSIS — I080 Rheumatic disorders of both mitral and aortic valves: Secondary | ICD-10-CM | POA: Insufficient documentation

## 2011-12-23 DIAGNOSIS — I10 Essential (primary) hypertension: Secondary | ICD-10-CM | POA: Insufficient documentation

## 2011-12-23 DIAGNOSIS — I079 Rheumatic tricuspid valve disease, unspecified: Secondary | ICD-10-CM | POA: Insufficient documentation

## 2011-12-23 LAB — LIPID PANEL
Cholesterol: 231 mg/dL — ABNORMAL HIGH (ref 0–200)
HDL: 58.8 mg/dL (ref >39.00)
VLDL: 29.2 mg/dL (ref 0.0–40.0)

## 2011-12-23 LAB — HEPATIC FUNCTION PANEL
ALT: 12 U/L (ref 0–35)
Albumin: 4.1 g/dL (ref 3.5–5.2)
Alkaline Phosphatase: 95 U/L (ref 39–117)
Bilirubin, Direct: 0 mg/dL (ref 0.0–0.3)
Total Protein: 7.6 g/dL (ref 6.0–8.3)

## 2011-12-23 LAB — BASIC METABOLIC PANEL
BUN: 13 mg/dL (ref 6–23)
Chloride: 104 mEq/L (ref 96–112)
Creatinine, Ser: 0.8 mg/dL (ref 0.4–1.2)
GFR: 77.03 mL/min (ref >60.00)
Potassium: 4.2 mEq/L (ref 3.5–5.1)

## 2011-12-23 LAB — LDL CHOLESTEROL, DIRECT: Direct LDL: 150.9 mg/dL

## 2011-12-23 NOTE — Patient Instructions (Addendum)
Your physician wants you to follow-up in: ONE YEAR WITH DR Shelda Pal will receive a reminder letter in the mail two months in advance. If you don't receive a letter, please call our office to schedule the follow-up appointment.   Your physician has requested that you have a lexiscan myoview. For further information please visit [email or web address] Please follow instruction sheet, as given.   Your physician has requested that you have an echocardiogram. Echocardiography is a painless test that uses sound waves to create images of your heart. It provides your doctor with information about the size and shape of your heart and how well your heart's chambers and valves are working. This procedure takes approximately one hour. There are no restrictions for this procedure.   Your physician recommends that you HAVE LAB WORK TODAY

## 2011-12-23 NOTE — Assessment & Plan Note (Signed)
Continue aspirin and statin. Schedule Myoview for risk stratification. 

## 2011-12-23 NOTE — Assessment & Plan Note (Signed)
Plan repeat echocardiogram. 

## 2011-12-23 NOTE — Assessment & Plan Note (Signed)
Continue aspirin and statin. Carotid Dopplers are followed by vascular surgery. 

## 2011-12-23 NOTE — Assessment & Plan Note (Signed)
Continue statin. Check lipids and liver. 

## 2011-12-23 NOTE — Progress Notes (Signed)
Echocardiogram performed.  

## 2011-12-23 NOTE — Assessment & Plan Note (Addendum)
Blood pressure is elevated. I have asked her to follow this at home. If it continues to be elevated we will add additional medications such as HCTZ or Norvasc. Check potassium and renal function.

## 2011-12-23 NOTE — Progress Notes (Signed)
   HPI: Pleasant female with past medical history of coronary artery disease status post drug-eluting stent to the LAD and circumflex in November of 2008, AS/AI. Last echocardiogram was performed in May of 2012. There was normal LV function. There was moderate aortic stenosis with a mean gradient of 31 mmHg; mild AI. There was mild mitral regurgitation. Myoview in November of 2010 showed inferolateral thinning but no ischemia. Ejection fraction was 58%. I last saw her in Nov of 2012. Since then the patient has dyspnea with more extreme activities but not with routine activities. It is relieved with rest. It is not associated with chest pain. There is no orthopnea, PND or pedal edema. There is no syncope or palpitations. There is no exertional chest pain.    Current Outpatient Prescriptions  Medication Sig Dispense Refill  . aspirin 81 MG tablet Take 81 mg by mouth daily.        Marland Kitchen atorvastatin (LIPITOR) 80 MG tablet 1 TAB BY MOUTH ONCE DAILY  30 tablet  4  . glipiZIDE (GLUCOTROL) 5 MG tablet Take 5 mg by mouth daily.        Marland Kitchen lisinopril (PRINIVIL,ZESTRIL) 40 MG tablet Take 40 mg by mouth daily.        . metoprolol (LOPRESSOR) 50 MG tablet TAKE ONE TABLET BY MOUTH TWICE DAILY  180 tablet  3     Past Medical History  Diagnosis Date  . Aortic stenosis   . CAD (coronary artery disease)   . CVD (cerebrovascular disease)   . Diabetes mellitus type 1   . Hyperlipidemia   . Hypertension   . PVD (peripheral vascular disease)   . Mitral regurgitation   . Aortic insufficiency     Past Surgical History  Procedure Date  . Tympanomastoidectomy 2002    right  . Cardiac catheterization 04/07/07    By Dr. Excell Seltzer -- 1. Severe LAD stenosis with successful PCI using a drug-eluting stent. 2. Severe left circumflex stenosis with successful PCI using a single  drug-eluting stent.    History   Social History  . Marital Status: Widowed    Spouse Name: N/A    Number of Children: N/A  . Years of  Education: N/A   Occupational History  . 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 Smoking status: Former Smoker -- 0.4 packs/day for 5 years    Types: Cigarettes    Quit date: 05/24/2006  . Smokeless tobacco: Not on file  . Alcohol Use: Yes  . Drug Use: No  . Sexually Active: Not on file   Other Topics Concern  . Not on file   Social History Narrative  . No narrative on file    ROS: no fevers or chills, productive cough, hemoptysis, dysphasia, odynophagia, melena, hematochezia, dysuria, hematuria, rash, seizure activity, orthopnea, PND, pedal edema, claudication. Remaining systems are negative.  Physical Exam: Well-developed well-nourished in no acute distress.  Skin is warm and dry.  HEENT is normal.  Neck is supple.  Chest is clear to auscultation with normal expansion.  Cardiovascular exam is regular rate and rhythm. 2/6 systolic murmur left sternal border radiating to the carotids. Abdominal exam nontender or distended. No masses palpated. Extremities show no edema. neuro grossly intact  ECG sinus bradycardia at a rate of 49. Nonspecific T-wave changes.

## 2011-12-27 ENCOUNTER — Telehealth: Payer: Self-pay | Admitting: Cardiology

## 2011-12-27 NOTE — Telephone Encounter (Signed)
New problem:  Per Kara Santiago patient calling back regarding test results

## 2011-12-27 NOTE — Telephone Encounter (Signed)
Echo results given, she will call back to confirm if her mother is taking lipitor 80 mg daily, Dr Jens Som questioned it due to the increase in her cholesterol.

## 2011-12-29 ENCOUNTER — Ambulatory Visit (HOSPITAL_COMMUNITY): Payer: Medicare Other | Attending: Cardiology | Admitting: Radiology

## 2011-12-29 VITALS — BP 133/58 | Ht 61.0 in | Wt 101.0 lb

## 2011-12-29 DIAGNOSIS — I2510 Atherosclerotic heart disease of native coronary artery without angina pectoris: Secondary | ICD-10-CM

## 2011-12-29 DIAGNOSIS — Z8673 Personal history of transient ischemic attack (TIA), and cerebral infarction without residual deficits: Secondary | ICD-10-CM | POA: Insufficient documentation

## 2011-12-29 DIAGNOSIS — I739 Peripheral vascular disease, unspecified: Secondary | ICD-10-CM | POA: Insufficient documentation

## 2011-12-29 DIAGNOSIS — R0989 Other specified symptoms and signs involving the circulatory and respiratory systems: Secondary | ICD-10-CM | POA: Insufficient documentation

## 2011-12-29 DIAGNOSIS — R0609 Other forms of dyspnea: Secondary | ICD-10-CM | POA: Insufficient documentation

## 2011-12-29 DIAGNOSIS — I059 Rheumatic mitral valve disease, unspecified: Secondary | ICD-10-CM | POA: Insufficient documentation

## 2011-12-29 DIAGNOSIS — E119 Type 2 diabetes mellitus without complications: Secondary | ICD-10-CM | POA: Insufficient documentation

## 2011-12-29 DIAGNOSIS — I10 Essential (primary) hypertension: Secondary | ICD-10-CM | POA: Insufficient documentation

## 2011-12-29 DIAGNOSIS — E785 Hyperlipidemia, unspecified: Secondary | ICD-10-CM | POA: Insufficient documentation

## 2011-12-29 DIAGNOSIS — Z87891 Personal history of nicotine dependence: Secondary | ICD-10-CM | POA: Insufficient documentation

## 2011-12-29 DIAGNOSIS — I779 Disorder of arteries and arterioles, unspecified: Secondary | ICD-10-CM | POA: Insufficient documentation

## 2011-12-29 DIAGNOSIS — R0602 Shortness of breath: Secondary | ICD-10-CM

## 2011-12-29 DIAGNOSIS — I359 Nonrheumatic aortic valve disorder, unspecified: Secondary | ICD-10-CM | POA: Insufficient documentation

## 2011-12-29 MED ORDER — REGADENOSON 0.4 MG/5ML IV SOLN
0.4000 mg | Freq: Once | INTRAVENOUS | Status: AC
  Administered 2011-12-29: 0.4 mg via INTRAVENOUS

## 2011-12-29 MED ORDER — TECHNETIUM TC 99M TETROFOSMIN IV KIT
33.0000 | PACK | Freq: Once | INTRAVENOUS | Status: AC | PRN
  Administered 2011-12-29: 33 via INTRAVENOUS

## 2011-12-29 MED ORDER — TECHNETIUM TC 99M TETROFOSMIN IV KIT
11.0000 | PACK | Freq: Once | INTRAVENOUS | Status: AC | PRN
  Administered 2011-12-29: 11 via INTRAVENOUS

## 2011-12-29 MED ORDER — AMINOPHYLLINE 25 MG/ML IV SOLN
75.0000 mg | Freq: Once | INTRAVENOUS | Status: AC
  Administered 2011-12-29: 75 mg via INTRAVENOUS

## 2011-12-29 NOTE — Progress Notes (Signed)
[facility] SITE 3 NUCLEAR MED [address] [place] [address] [phone]  Cardiology Nuclear Med Study  Kara Santiago is a 78 y.o. female     MRN : [id number]     DOB: [birth year]  Procedure Date: 12/29/2011  Nuclear Med Background Indication for Stress Test:  Evaluation for Ischemia, PTCA/Stent Patency  History:  11/08 MI-NSTEMI-Heart Cath: Severe Stenosis LAD CFX EF: 55-60% residual N/O RCA -Angioplasty -LAD CFX -Stent LAD CFX 11/10 MPS: Inferolateral thinning EF: 58%  12/23/11: mild LVH  EF: 55-60% mod As mild AR mild MR Cardiac Risk Factors: Carotid Disease, Claudication, CVA, History of Smoking, Hypertension, Lipids, NIDDM and PVD  Symptoms:  DOE   Nuclear Pre-Procedure Caffeine/Decaff Intake:  None > 12 hrs NPO After: 7:30pm   Lungs:  clear O2 Sat: 98% on room air. IV 0.9% NS with Angio Cath:  20g  IV Site: R Antecubital x 1, tolerated well IV Started by:  Irean Hong, RN  Chest Size (in):  32 Cup Size: A  Height: 5\' 1"  (1.549 m)  Weight:  101 lb (45.813 kg)  BMI:  Body mass index is 19.08 kg/(m^2). Tech Comments:  Took lopressor this am, and held Glucotrol this am  This patient was given Aminophylline 75 mg IV with relief of all symptoms.    Nuclear Med Study 1 or 2 day study: 1 day  Stress Test Type:  Eugenie Birks  Reading MD: Charlton Haws, MD  Order Authorizing Provider:  Olga Millers, MD  Resting Radionuclide: Technetium 52m Tetrofosmin  Resting Radionuclide Dose: 11.0 mCi   Stress Radionuclide:  Technetium 17m Tetrofosmin  Stress Radionuclide Dose: 33.0 mCi           Stress Protocol Rest HR: 46 Stress HR: 78  Rest BP: 133/58 Stress BP: 154/72  Exercise Time (min): n/a METS: n/a   Predicted Max HR: 136 bpm % Max HR: 57.35 bpm Rate Pressure Product: 21308   Dose of Adenosine (mg):  n/a Dose of Lexiscan: 0.4 mg  Dose of Atropine (mg): n/a Dose of Dobutamine: n/a mcg/kg/min (at max HR)  Stress Test Technologist: Milana Na, EMT-P   Nuclear Technologist:  Doyne Keel, CNMT     Rest Procedure:  Myocardial perfusion imaging was performed at rest 45 minutes following the intravenous administration of Technetium 39m Tetrofosmin. Rest ECG: NSR - Normal EKG  Stress Procedure:  The patient received IV Lexiscan 0.4 mg over 15-seconds.  Technetium 38m Tetrofosmin injected at 30-seconds.  There were no significant changes, + sob, abdominal pain, and a headache  with Lexiscan.  Quantitative spect images were obtained after a 45 minute delay. Stress ECG: No significant change from baseline ECG  QPS Raw Data Images:  Normal; no motion artifact; normal heart/lung ratio. Stress Images:  Normal homogeneous uptake in all areas of the myocardium. Rest Images:  Normal homogeneous uptake in all areas of the myocardium. Subtraction (SDS):  Normal Transient Ischemic Dilatation (Normal <1.22):  1.04 Lung/Heart Ratio (Normal <0.45):  0.25  Quantitative Gated Spect Images QGS EDV:  77 ml QGS ESV:  31 ml  Impression Exercise Capacity:  Lexiscan with no exercise. BP Response:  Normal blood pressure response. Clinical Symptoms:  There is dyspnea. ECG Impression:  LVH with IVCD Comparison with Prior Nuclear Study: No images to compare  Overall Impression:  Normal stress nuclear study.  LV Ejection Fraction: 60%.  LV Wall Motion:  NL LV Function; NL Wall Motion      Charlton Haws

## 2012-01-25 ENCOUNTER — Encounter: Payer: Self-pay | Admitting: Neurosurgery

## 2012-01-26 ENCOUNTER — Encounter: Payer: Self-pay | Admitting: Neurosurgery

## 2012-01-26 ENCOUNTER — Other Ambulatory Visit (INDEPENDENT_AMBULATORY_CARE_PROVIDER_SITE_OTHER): Payer: Medicare Other | Admitting: *Deleted

## 2012-01-26 ENCOUNTER — Ambulatory Visit (INDEPENDENT_AMBULATORY_CARE_PROVIDER_SITE_OTHER): Payer: Medicare Other | Admitting: Neurosurgery

## 2012-01-26 VITALS — BP 128/59 | HR 45 | Resp 14 | Ht 61.0 in | Wt 102.7 lb

## 2012-01-26 DIAGNOSIS — I6529 Occlusion and stenosis of unspecified carotid artery: Secondary | ICD-10-CM | POA: Insufficient documentation

## 2012-01-26 DIAGNOSIS — Z48812 Encounter for surgical aftercare following surgery on the circulatory system: Secondary | ICD-10-CM

## 2012-01-26 NOTE — Progress Notes (Signed)
[facility] Carotid Office Note  CC: Annual carotid surveillance Referring Physician: Early  History of Present Illness: 78 year old female patient of Dr. Arbie Cookey followed for known carotid stenosis without intervention. The patient denies any signs or symptoms of CVA, TIA, amaurosis fugax or any neural deficit. The patient denies any new medical diagnoses or recent surgery.  Past Medical History  Diagnosis Date  . Aortic stenosis   . CAD (coronary artery disease)   . CVD (cerebrovascular disease)   . Diabetes mellitus type 1   . Hyperlipidemia   . Hypertension   . PVD (peripheral vascular disease)   . Mitral regurgitation   . Aortic insufficiency   . Carotid artery occlusion     ROS: [x]  Positive   [ ]  Denies    General: [ ]  Weight loss, [ ]  Fever, [ ]  chills Neurologic: [ ]  Dizziness, [ ]  Blackouts, [ ]  Seizure [ ]  Stroke, [ ]  "Mini stroke", [ ]  Slurred speech, [ ]  Temporary blindness; [ ]  weakness in arms or legs, [ ]  Hoarseness Cardiac: [ ]  Chest pain/pressure, [ ]  Shortness of breath at rest [ ]  Shortness of breath with exertion, [ ]  Atrial fibrillation or irregular heartbeat Vascular: [ ]  Pain in legs with walking, [ ]  Pain in legs at rest, [ ]  Pain in legs at night,  [ ]  Non-healing ulcer, [ ]  Blood clot in vein/DVT,   Pulmonary: [ ]  Home oxygen, [ ]  Productive cough, [ ]  Coughing up blood, [ ]  Asthma,  [ ]  Wheezing Musculoskeletal:  [ ]  Arthritis, [ ]  Low back pain, [ ]  Joint pain Hematologic: [ ]  Easy Bruising, [ ]  Anemia; [ ]  Hepatitis Gastrointestinal: [ ]  Blood in stool, [ ]  Gastroesophageal Reflux/heartburn, [ ]  Trouble swallowing Urinary: [ ]  chronic Kidney disease, [ ]  on HD - [ ]  MWF or [ ]  TTHS, [ ]  Burning with urination, [ ]  Difficulty urinating Skin: [ ]  Rashes, [ ]  Wounds Psychological: [ ]  Anxiety, [ ]  Depression   Social History History  Substance Use Topics  . Smoking status: Former Smoker -- 0.4 packs/day for 5 years   Types: Cigarettes    Quit date: 05/24/2006  . Smokeless tobacco: Not on file  . Alcohol Use: Yes    Family History Family History  Problem Relation Age of Onset  . Pneumonia    . Heart disease Mother   . Heart disease Sister   . Heart disease Brother   . Heart disease Son     No Known Allergies  Current Outpatient Prescriptions  Medication Sig Dispense Refill  . aspirin 81 MG tablet Take 81 mg by mouth daily.        Marland Kitchen atorvastatin (LIPITOR) 80 MG tablet 1 TAB BY MOUTH ONCE DAILY  30 tablet  4  . glipiZIDE (GLUCOTROL) 5 MG tablet Take 5 mg by mouth daily.        Marland Kitchen lisinopril (PRINIVIL,ZESTRIL) 40 MG tablet Take 40 mg by mouth daily.        . metoprolol (LOPRESSOR) 50 MG tablet TAKE ONE TABLET BY MOUTH TWICE DAILY  180 tablet  3    Physical Examination  Filed Vitals:   01/26/12 1331  BP: 128/59  Pulse: 45  Resp:     Body mass index is 19.40 kg/(m^2).  General:  WDWN in NAD Gait: Normal HEENT: WNL Eyes: Pupils equal Pulmonary: normal non-labored breathing , without Rales, rhonchi,  wheezing Cardiac: RRR, without  Murmurs, rubs or gallops;  Abdomen: soft, NT, no masses Skin: no rashes, ulcers noted  Vascular Exam Pulses: 3+ radial pulses bilaterally Carotid bruits: Carotid pulses to auscultation no bruits are heard Extremities without ischemic changes, no Gangrene , no cellulitis; no open wounds;  Musculoskeletal: no muscle wasting or atrophy   Neurologic: A&O X 3; Appropriate Affect ; SENSATION: normal; MOTOR FUNCTION:  moving all extremities equally. Speech is fluent/normal  Non-Invasive Vascular Imaging CAROTID DUPLEX 01/26/2012  Right ICA 20 - 39 % stenosis Left ICA 40 - 59 % stenosis   ASSESSMENT/PLAN: Asymptomatic patient with mild/moderate bilateral carotid stenosis left greater than right. This is unchanged from previous exam when comparing the two. The patient will followup in one year with repeat carotid duplex, her questions were encouraged and  answered, she is in agreement with this plan. The patient knows the signs and symptoms of CVA and knows to report to the nearest emergency department should that occur.  Lauree Chandler ANP   Clinic MD: Edilia Bo

## 2012-01-26 NOTE — Addendum Note (Signed)
Addended by: Merri Ray A on: 01/26/2012 04:09 PM   Modules accepted: Orders

## 2012-02-07 ENCOUNTER — Telehealth: Payer: Self-pay

## 2012-02-07 ENCOUNTER — Other Ambulatory Visit: Payer: Self-pay | Admitting: Cardiology

## 2012-02-07 MED ORDER — METOPROLOL TARTRATE 50 MG PO TABS: 50.0000 mg | ORAL_TABLET | Freq: Two times a day (BID) | ORAL | Status: DC

## 2012-02-07 MED ORDER — ATORVASTATIN CALCIUM 80 MG PO TABS: 80.0000 mg | ORAL_TABLET | Freq: Every day | ORAL | Status: DC

## 2012-02-07 NOTE — Telephone Encounter (Signed)
**Note De-identified Leny Morozov Obfuscation** LMOM

## 2012-02-07 NOTE — Telephone Encounter (Signed)
F/U   Returning call back to nurse.   

## 2012-02-07 NOTE — Telephone Encounter (Signed)
Pt's daughter, [name], states that [facility] gave the pt. Lisinopril 20 mg to be taken bid. According to pt's chart she should be taking Lisinopril 40 mg daily. [name] states that when pt. takes 20 mg bid her BP runs high at around 190/90 but when pt. takes 40 mg daily her BP remains normal. [name] is advised that pt. should take 40 mg of Lisinopril daily, she verbalized understanding. [facility] [place] made aware of correct dose.

## 2012-02-07 NOTE — Telephone Encounter (Signed)
**Note De-Identified Kara Santiago Obfuscation** Message copied by Demetrios Loll on Mon Feb 07, 2012  2:39 PM ------      Message from: [place], [place] L      Created: Mon Feb 07, 2012 12:00 PM      Regarding: BP update still high       Pts daughter called and stated she was to inform Dr. Jens Som if her mothers BP was still running high.  In the morning its been running 210/92.  Her BP medications was reduced, was wondering what she they do to lower her BP.  Call her daughter Kara Santiago [phone].            Thank you,      Sander Radon

## 2012-02-10 ENCOUNTER — Telehealth: Payer: Self-pay | Admitting: *Deleted

## 2012-02-10 NOTE — Telephone Encounter (Signed)
LMTCB

## 2012-12-22 ENCOUNTER — Ambulatory Visit (INDEPENDENT_AMBULATORY_CARE_PROVIDER_SITE_OTHER): Payer: Medicare Other | Admitting: Cardiology

## 2012-12-22 ENCOUNTER — Encounter: Payer: Self-pay | Admitting: Cardiology

## 2012-12-22 VITALS — BP 132/64 | HR 46 | Ht 61.0 in | Wt 108.8 lb

## 2012-12-22 DIAGNOSIS — I10 Essential (primary) hypertension: Secondary | ICD-10-CM

## 2012-12-22 DIAGNOSIS — I359 Nonrheumatic aortic valve disorder, unspecified: Secondary | ICD-10-CM

## 2012-12-22 DIAGNOSIS — I2510 Atherosclerotic heart disease of native coronary artery without angina pectoris: Secondary | ICD-10-CM

## 2012-12-22 LAB — BASIC METABOLIC PANEL
Chloride: 104 mEq/L (ref 96–112)
Potassium: 4.4 mEq/L (ref 3.5–5.1)

## 2012-12-22 LAB — HEPATIC FUNCTION PANEL
Albumin: 3.7 g/dL (ref 3.5–5.2)
Bilirubin, Direct: 0.1 mg/dL (ref 0.0–0.3)
Total Protein: 7.1 g/dL (ref 6.0–8.3)

## 2012-12-22 LAB — LDL CHOLESTEROL, DIRECT: Direct LDL: 151.7 mg/dL

## 2012-12-22 LAB — LIPID PANEL
HDL: 50.1 mg/dL (ref >39.00)
VLDL: 25.8 mg/dL (ref 0.0–40.0)

## 2012-12-22 MED ORDER — METOPROLOL TARTRATE 25 MG PO TABS: 25.0000 mg | ORAL_TABLET | Freq: Two times a day (BID) | ORAL | Status: DC

## 2012-12-22 NOTE — Progress Notes (Signed)
HPI: Pleasant female with past medical history of coronary artery disease status post drug-eluting stent to the LAD and circumflex in November of 2008, AS/AI for fu. Last echocardiogram was performed in August of 2013. LV function was normal. There was moderate aortic stenosis with a mean gradient of 31 mm mercury. Mild aortic insufficiency. There was mild left atrial enlargement and mild mitral regurgitation. Myoview in August of 2013 showed normal perfusion and an ejection fraction of 60%. I last saw her in August 2013. Since then the patient has dyspnea with more extreme activities but not with routine activities. It is relieved with rest. It is not associated with chest pain. There is no orthopnea, PND or pedal edema. There is no syncope or palpitations. There is no exertional chest pain.   Current Outpatient Prescriptions  Medication Sig Dispense Refill  . aspirin 81 MG tablet Take 81 mg by mouth daily.        Marland Kitchen atorvastatin (LIPITOR) 80 MG tablet Take 1 tablet (80 mg total) by mouth daily.  90 tablet  3  . glipiZIDE (GLUCOTROL) 5 MG tablet Take 5 mg by mouth daily.        Marland Kitchen lisinopril (PRINIVIL,ZESTRIL) 40 MG tablet Take 40 mg by mouth daily.        . metoprolol (LOPRESSOR) 50 MG tablet Take 1 tablet (50 mg total) by mouth 2 (two) times daily.  180 tablet  3   No current facility-administered medications for this visit.     Past Medical History  Diagnosis Date  . Aortic stenosis   . CAD (coronary artery disease)   . CVD (cerebrovascular disease)   . Diabetes mellitus type 1   . Hyperlipidemia   . Hypertension   . PVD (peripheral vascular disease)   . Mitral regurgitation   . Aortic insufficiency   . Carotid artery occlusion     Past Surgical History  Procedure Laterality Date  . Tympanomastoidectomy  2002    right  . Cardiac catheterization  04/07/07    By Dr. Excell Seltzer -- 1. Severe LAD stenosis with successful PCI using a drug-eluting stent. 2. Severe left circumflex  stenosis with successful PCI using a single  drug-eluting stent.  . Stents  2008    History   Social History  . Marital Status: Widowed    Spouse Name: N/A    Number of Children: N/A  . Years of Education: N/A   Occupational History  . 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 Smoking status: Former Smoker -- 0.40 packs/day for 5 years    Types: Cigarettes    Quit date: 05/24/2006  . Smokeless tobacco: Not on file  . Alcohol Use: Yes  . Drug Use: No  . Sexually Active: Not on file   Other Topics Concern  . Not on file   Social History Narrative  . No narrative on file    ROS: no fevers or chills, productive cough, hemoptysis, dysphasia, odynophagia, melena, hematochezia, dysuria, hematuria, rash, seizure activity, orthopnea, PND, pedal edema, claudication. Remaining systems are negative.  Physical Exam: Well-developed well-nourished in no acute distress.  Skin is warm and dry.  HEENT is normal.  Neck is supple.  Chest is clear to auscultation with normal expansion.  Cardiovascular exam is regular rate and rhythm. 2/6 systolic murmur left sternal border. Abdominal exam nontender or distended. No masses palpated. Extremities show no edema. neuro grossly intact  ECG sinus bradycardia at a rate of  46. Nonspecific T-wave changes.

## 2012-12-22 NOTE — Assessment & Plan Note (Addendum)
Blood pressure is controlled. However she is bradycardic. Decrease metoprolol to 25 mg by mouth twice a day. Check potassium and renal function.

## 2012-12-22 NOTE — Assessment & Plan Note (Signed)
Continue aspirin and statin. Followed by vascular surgery. 

## 2012-12-22 NOTE — Patient Instructions (Addendum)
Your physician wants you to follow-up in: ONE YEAR WITH DR Shelda Pal will receive a reminder letter in the mail two months in advance. If you don't receive a letter, please call our office to schedule the follow-up appointment.   Your physician recommends that you HAVE LAB WORK TODAY  Your physician has requested that you have an echocardiogram. Echocardiography is a painless test that uses sound waves to create images of your heart. It provides your doctor with information about the size and shape of your heart and how well your heart's chambers and valves are working. This procedure takes approximately one hour. There are no restrictions for this procedure.   DECREASE METOPROLOL TO 25 MG (1/2 50 MG TABLET) TWICE DAILY

## 2012-12-22 NOTE — Assessment & Plan Note (Signed)
Continue aspirin and statin. 

## 2012-12-22 NOTE — Assessment & Plan Note (Signed)
Continue statin. Check lipids and liver. 

## 2012-12-22 NOTE — Assessment & Plan Note (Signed)
Plan repeat echocardiogram. 

## 2012-12-27 ENCOUNTER — Other Ambulatory Visit: Payer: Self-pay

## 2012-12-27 ENCOUNTER — Encounter: Payer: Self-pay | Admitting: *Deleted

## 2012-12-27 ENCOUNTER — Telehealth: Payer: Self-pay | Admitting: Cardiology

## 2012-12-27 NOTE — Telephone Encounter (Signed)
Spoke with pt dtr, aware of lab results and she confirmed the pt is taking lipitor 80 mg daily. Pt will watch her diet.

## 2012-12-27 NOTE — Telephone Encounter (Signed)
Follow up  Pt states she is returning your call from yesterday.

## 2013-01-01 ENCOUNTER — Ambulatory Visit (HOSPITAL_COMMUNITY): Payer: Medicare Other | Attending: Cardiology

## 2013-01-01 DIAGNOSIS — I10 Essential (primary) hypertension: Secondary | ICD-10-CM | POA: Insufficient documentation

## 2013-01-01 DIAGNOSIS — I2510 Atherosclerotic heart disease of native coronary artery without angina pectoris: Secondary | ICD-10-CM | POA: Insufficient documentation

## 2013-01-01 DIAGNOSIS — I359 Nonrheumatic aortic valve disorder, unspecified: Secondary | ICD-10-CM | POA: Insufficient documentation

## 2013-01-01 DIAGNOSIS — E119 Type 2 diabetes mellitus without complications: Secondary | ICD-10-CM | POA: Insufficient documentation

## 2013-01-01 DIAGNOSIS — E785 Hyperlipidemia, unspecified: Secondary | ICD-10-CM | POA: Insufficient documentation

## 2013-01-01 NOTE — Progress Notes (Signed)
Echocardiogram performed.  

## 2013-01-24 ENCOUNTER — Encounter: Payer: Self-pay | Admitting: Family

## 2013-01-25 ENCOUNTER — Other Ambulatory Visit (INDEPENDENT_AMBULATORY_CARE_PROVIDER_SITE_OTHER): Payer: Medicare Other | Admitting: *Deleted

## 2013-01-25 ENCOUNTER — Encounter: Payer: Self-pay | Admitting: Family

## 2013-01-25 ENCOUNTER — Ambulatory Visit (INDEPENDENT_AMBULATORY_CARE_PROVIDER_SITE_OTHER): Payer: Medicare Other | Admitting: Family

## 2013-01-25 DIAGNOSIS — I6529 Occlusion and stenosis of unspecified carotid artery: Secondary | ICD-10-CM

## 2013-01-25 DIAGNOSIS — Z48812 Encounter for surgical aftercare following surgery on the circulatory system: Secondary | ICD-10-CM

## 2013-01-25 NOTE — Addendum Note (Signed)
Addended by: Adria Dill L on: 01/25/2013 03:23 PM   Modules accepted: Orders

## 2013-01-25 NOTE — Patient Instructions (Addendum)
Stroke Prevention Some medical conditions and behaviors are associated with an increased chance of having a stroke. You may prevent a stroke by making healthy choices and managing medical conditions. Reduce your risk of having a stroke by:  Staying physically active. Get at least 30 minutes of activity on most or all days.  Not smoking. It may also be helpful to avoid exposure to secondhand smoke.  Limiting alcohol use. Moderate alcohol use is considered to be:  No more than 2 drinks per day for men.  No more than 1 drink per day for nonpregnant women.  Eating healthy foods.  Include 5 or more servings of fruits and vegetables a day.  Certain diets may be prescribed to address high blood pressure, high cholesterol, diabetes, or obesity.  Managing your cholesterol levels.  A low-saturated fat, low-trans fat, low-cholesterol, and high-fiber diet may control cholesterol levels.  Take any prescribed medicines to control cholesterol as directed by your caregiver.  Managing your diabetes.  A controlled-carbohydrate, controlled-sugar diet is recommended to manage diabetes.  Take any prescribed medicines to control diabetes as directed by your caregiver.  Controlling your high blood pressure (hypertension).  A low-salt (sodium), low-saturated fat, low-trans fat, and low-cholesterol diet is recommended to manage high blood pressure.  Take any prescribed medicines to control hypertension as directed by your caregiver.  Maintaining a healthy weight.  A reduced-calorie, low-sodium, low-saturated fat, low-trans fat, low-cholesterol diet is recommended to manage weight.  Stopping drug abuse.  Avoiding birth control pills.  Talk to your caregiver about the risks of taking birth control pills if you are over 35 years old, smoke, get migraines, or have ever had a blood clot.  Getting evaluated for sleep disorders (sleep apnea).  Talk to your caregiver about getting a sleep evaluation  if you snore a lot or have excessive sleepiness.  Taking medicines as directed by your caregiver.  For some people, aspirin or blood thinners (anticoagulants) are helpful in reducing the risk of forming abnormal blood clots that can lead to stroke. If you have the irregular heart rhythm of atrial fibrillation, you should be on a blood thinner unless there is a good reason you cannot take them.  Understand all your medicine instructions. SEEK IMMEDIATE MEDICAL CARE IF:   You have sudden weakness or numbness of the face, arm, or leg, especially on one side of the body.  You have sudden confusion.  You have trouble speaking (aphasia) or understanding.  You have sudden trouble seeing in one or both eyes.  You have sudden trouble walking.  You have dizziness.  You have a loss of balance or coordination.  You have a sudden, severe headache with no known cause.  You have new chest pain or an irregular heartbeat. Any of these symptoms may represent a serious problem that is an emergency. Do not wait to see if the symptoms will go away. Get medical help right away. Call your local emergency services (911 in [place] Do not drive yourself to the hospital. Document Released: 06/17/2004 Document Revised: 08/02/2011 Document Reviewed: 12/28/2010 [employer] Patient Information 2014 [employer].  

## 2013-01-25 NOTE — Progress Notes (Deleted)
Established Carotid Patient  Previous Carotid surgery: No  History of Present Illness  Kara Santiago is a 79 y.o. female who has known carotid stenosis returns for scheduled Duplex surveillance. She has not required intervention to date.Dr. Arbie Cookey has reviewed her Duplex results in the past.  Patient has {FINDINGS; POSITIVE [phone]::"Negative"} history of TIA or stroke symptom.  The patient {Actions; denies-reports:120008::"denies"} amaurosis fugax or monocular blindness.  The patient  {Actions; denies-reports:120008::"denies"} facial drooping.  Pt. {Actions; denies-reports:120008::"denies"} hemiplegia.  The patient {Actions; denies-reports:120008::"denies"} receptive or expressive aphasia.  Pt. {Actions; denies-reports:120008::"denies"} extremity weakness.  The patient's previous neurologic deficits are {improved/worse/unchanged:3041574}.   {Actions; denies-reports:120008::"denies"} New Medical or Surgical History: ***  Pt Diabetic: Yes Pt smoker: former smoker, quit 2008  Pt meds include: Statin : {yes no:315493::"Yes"} Betablocker: {yes no:315493::"Yes"} ASA: {yes no:315493::"Yes"} Other anticoagulants/antiplatelets: ***   Past Medical History  Diagnosis Date  . Aortic stenosis   . CAD (coronary artery disease)   . CVD (cerebrovascular disease)   . Diabetes mellitus type 1   . Hyperlipidemia   . Hypertension   . PVD (peripheral vascular disease)   . Mitral regurgitation   . Aortic insufficiency   . Carotid artery occlusion     Social History History  Substance Use Topics  . Smoking status: Former Smoker -- 0.40 packs/day for 5 years    Types: Cigarettes    Quit date: 05/24/2006  . Smokeless tobacco: Not on file  . Alcohol Use: Yes    Family History Family History  Problem Relation Age of Onset  . Pneumonia    . Heart disease Mother   . Heart disease Sister   . Heart disease Brother   . Heart disease Son     Surgical History Past Surgical  History  Procedure Laterality Date  . Tympanomastoidectomy  2002    right  . Cardiac catheterization  04/07/07    By Dr. Excell Seltzer -- 1. Severe LAD stenosis with successful PCI using a drug-eluting stent. 2. Severe left circumflex stenosis with successful PCI using a single  drug-eluting stent.  . Stents  2008    No Known Allergies  Current Outpatient Prescriptions  Medication Sig Dispense Refill  . aspirin 81 MG tablet Take 81 mg by mouth daily.        Marland Kitchen atorvastatin (LIPITOR) 80 MG tablet Take 1 tablet (80 mg total) by mouth daily.  90 tablet  3  . glipiZIDE (GLUCOTROL) 5 MG tablet Take 5 mg by mouth daily.        Marland Kitchen lisinopril (PRINIVIL,ZESTRIL) 40 MG tablet Take 40 mg by mouth daily.        . metoprolol (LOPRESSOR) 25 MG tablet Take 1 tablet (25 mg total) by mouth 2 (two) times daily.  180 tablet  3   No current facility-administered medications for this visit.    Review of Systems : [x]  Positive   [ ]  Denies  General:[ ]  Weight loss,  [ ]  Weight gain, [ ]  Loss of appetite, [ ]  Fever, [ ]  chills  Neurologic: [ ]  Dizziness, [ ]  Blackouts, [ ]  Headaches, [ ]  Seizure [ ]  Stroke, [ ]  "Mini stroke", [ ]  Slurred speech, [ ]  Temporary blindness;  [ ] weakness,  Ear/Nose/Throat: [ ]  Change in hearing, [ ]  Nose bleeds, [ ]  Hoarseness  Vascular:[ ]  Pain in legs with walking, [ ]  Pain in feet while lying flat , [ ]   Non-healing ulcer, [ ]  Blood clot in vein,    Pulmonary: [ ]  Home  oxygen, [ ]   Productive cough, [ ]  Bronchitis, [ ]  Coughing up blood,  [ ]  Asthma, [ ]  Wheezing  Musculoskeletal:  [ ]  Arthritis, [ ]  Joint pain, [ ]  low back pain  Cardiac: [ ]  Chest pain, [ ]  Shortness of breath when lying flat, [ ]  Shortness of breath with exertion, [ ]  Palpitations, [ ]  Heart murmur, [ ]   Atrial fibrillation  Hematologic:[ ]  Easy Bruising, [ ]  Anemia; [ ]  Hepatitis  Psychiatric: [ ]   Depression, [ ]  Anxiety   Gastrointestinal: [ ]  Black stool, [ ]  Blood in stool, [ ]  Peptic ulcer disease,   [ ]  Gastroesophageal Reflux, [ ]  Trouble swallowing, [ ]  Diarrhea, [ ]  Constipation  Urinary: [ ]  chronic Kidney disease, [ ]  on HD, [ ]  Burning with urination, [ ]  Frequent urination, [ ]  Difficulty urinating;   Skin: [ ]  Rashes, [ ]  Wounds    Physical Examination  There were no vitals filed for this visit.  General: WDWN {Desc; female/female:11659} in NAD GAIT: {PE ZOXW:960454} Eyes: PERRLA Pulmonary:  CTAB, {FINDINGS; POSITIVE NEGATIVE:808-758-5571}  Rales, {FINDINGS; POSITIVE NEGATIVE:[id number]} rhonchi, & {FINDINGS; POSITIVE NEGATIVE:808-758-5571} wheezing.  Cardiac: {Desc; regular/irreg:14544} Rhythm ,  {FINDINGS; POSITIVE NEGATIVE:[id number]} Murmurs.  VASCULAR EXAM Carotid Bruits Left Right   {FINDINGS; POSITIVE NEGATIVE:808-758-5571} {FINDINGS; POSITIVE NEGATIVE:808-758-5571}                                                                                                                                LE Pulses LEFT RIGHT       FEMORAL  {PE DOPPLER EXAM ORTHOSURG:330610::"*** palpable"}  {PE DOPPLER EXAM ORTHOSURG:330610::"*** palpable"}        POPLITEAL  {PE DOPPLER EXAM ORTHOSURG:330610::"*** palpable"}   {PE DOPPLER EXAM ORTHOSURG:330610::"*** palpable"}       POSTERIOR TIBIAL  {PE DOPPLER EXAM ORTHOSURG:330610::"*** palpable"}   {PE DOPPLER EXAM ORTHOSURG:330610::"*** palpable"}        DORSALIS PEDIS      ANTERIOR TIBIAL {PE DOPPLER EXAM ORTHOSURG:330610::"*** palpable"}   {PE DOPPLER EXAM ORTHOSURG:330610::"*** palpable"}        PERONEAL {PE DOPPLER EXAM ORTHOSURG:330610::"*** Palpable"}   {PE DOPPLER EXAM ORTHOSURG:330610::"*** Palpable"}       Gastrointestinal: {UJ:8119147},  {pos/neg/not done:321853} masses.  Musculoskeletal: {FINDINGS; POSITIVE NEGATIVE:808-758-5571} muscle atrophy/wasting. M/S 5/5 throughout *** except ***, Extremities without ischemic changes *** except  ***  Neurologic: A&O X 3; Appropriate Affect ; SENSATION ;{vibratory sensation:19809};  MOTOR FUNCTION: {Neuro motor system:31838} Speech is {Findings; speech psychiatric:30485} CN 2-12 intact *** except ***, Pain and light touch intact in extremities *** except ***, Motor exam as listed above.   Non-Invasive Vascular Imaging CAROTID DUPLEX 01/25/2013   Right ICA {Vasm carotid art exam %:30973} stenosis Left ICA {Vasm carotid art exam %:30973} stenosis  Previous carotid studies demonstrated: RICA {Vasm carotid art exam %:30973} stenosis, LICA {Vasm carotid art exam %:30973} stenosis.  These findings are {improved/worse/unchanged:3041574} from previous exam  Previous angiogram: {yes no:315493::"Yes"} with findings  of ***   Assessment: JALINE PINCOCK is a 79 y.o. female who presents with:{Desc;symptomatic/asymptomatic:10923}{Vasm carotid art exam %:30973} {Right, left-initial cap:5607} ICA  stenosis The  ICA stenosis is  {improved/worse/unchanged:3041574} from previous exam. Pt. {Is/is not:9024} a candidate for CEA at this time  Plan: Follow-up in {NUMBERS 1-10:18281} {days/wks/mos/yrs:310907} with Carotid Duplex scan and ***   I discussed in depth with the patient the nature of atherosclerosis, and emphasized the importance of maximal medical management including strict control of blood pressure, blood glucose, and lipid levels, obtaining regular exercise, and cessation of smoking.  The patient is aware that without maximal medical management the underlying atherosclerotic disease process will progress, limiting the benefit of any interventions. Pt was given information regarding stroke symptoms and prevention. Thank you for allowing [place] to participate in this patient's care.  Charisse March, RN, MSN, FNP-C [facility] [place] Office: [phone]  Clinic Physician: Early  01/25/2013 9:36 AM

## 2013-01-25 NOTE — Progress Notes (Signed)
Established Carotid Patient  Previous Carotid surgery: No  History of Present Illness  Kara Santiago is a 79 y.o. female who has known carotid stenosis returns for scheduled Duplex surveillance. She has not required intervention to date. Dr. Arbie Cookey has reviewed her Duplex results in the past.   Patient has Negative history of TIA or stroke symptom.  The patient denies amaurosis fugax or monocular blindness.  The patient  denies facial drooping.  Pt. denies hemiplegia.  The patient denies receptive or expressive aphasia.  Pt. denies extremity weakness.  The patient's previous neurologic deficits are Unchanged.  denies New Medical or Surgical History:   Pt Diabetic: Yes Pt smoker: former smoker, quit 2008  Pt meds include: Statin : Yes Betablocker: Yes ASA: Yes Other anticoagulants/antiplatelets: none   Past Medical History  Diagnosis Date  . Aortic stenosis   . CAD (coronary artery disease)   . CVD (cerebrovascular disease)   . Hyperlipidemia   . Hypertension   . PVD (peripheral vascular disease)   . Mitral regurgitation   . Aortic insufficiency   . Carotid artery occlusion   . Diabetes mellitus type 2, controlled     Social History History  Substance Use Topics  . Smoking status: Former Smoker -- 0.40 packs/day for 5 years    Types: Cigarettes    Quit date: 05/24/2006  . Smokeless tobacco: Never Used  . Alcohol Use: Yes    Family History Family History  Problem Relation Age of Onset  . Pneumonia    . Heart disease Mother   . Heart disease Sister   . Heart disease Brother   . Heart disease Son     Surgical History Past Surgical History  Procedure Laterality Date  . Tympanomastoidectomy  2002    right  . Cardiac catheterization  04/07/07    By Dr. Excell Seltzer -- 1. Severe LAD stenosis with successful PCI using a drug-eluting stent. 2. Severe left circumflex stenosis with successful PCI using a single  drug-eluting stent.  . Stents  2008    No Known  Allergies  Current Outpatient Prescriptions  Medication Sig Dispense Refill  . aspirin 81 MG tablet Take 81 mg by mouth daily.        Marland Kitchen atorvastatin (LIPITOR) 80 MG tablet Take 1 tablet (80 mg total) by mouth daily.  90 tablet  3  . glipiZIDE (GLUCOTROL) 5 MG tablet Take 5 mg by mouth daily.        Marland Kitchen lisinopril (PRINIVIL,ZESTRIL) 40 MG tablet Take 40 mg by mouth daily.        . metoprolol (LOPRESSOR) 25 MG tablet Take 1 tablet (25 mg total) by mouth 2 (two) times daily.  180 tablet  3   No current facility-administered medications for this visit.    Review of Systems : [x]  Positive   [ ]  Denies  General:[ ]  Weight loss,  [ ]  Weight gain, [ ]  Loss of appetite, [ ]  Fever, [ ]  chills  Neurologic: [ ]  Dizziness, [ ]  Blackouts, [ ]  Headaches, [ ]  Seizure [ ]  Stroke, [ ]  "Mini stroke", [ ]  Slurred speech, [ ]  Temporary blindness;  [ ] weakness,  Ear/Nose/Throat: [ ]  Change in hearing, [ ]  Nose bleeds, [ ]  Hoarseness  Vascular:[ ]  Pain in legs with walking, [ ]  Pain in feet while lying flat , [ ]   Non-healing ulcer, [ ]  Blood clot in vein,    Pulmonary: [ ]  Home oxygen, [ ]   Productive cough, [ ]  Bronchitis, [ ]   Coughing up blood,  [ ]  Asthma, [ ]  Wheezing  Musculoskeletal:  [ ]  Arthritis, [ ]  Joint pain, [ ]  low back pain  Cardiac: [ ]  Chest pain, [ ]  Shortness of breath when lying flat, [ ]  Shortness of breath with exertion, [ ]  Palpitations, [ ]  Heart murmur, [ ]   Atrial fibrillation  Hematologic:[ ]  Easy Bruising, [ ]  Anemia; [ ]  Hepatitis  Psychiatric: [ ]   Depression, [ ]  Anxiety   Gastrointestinal: [ ]  Black stool, [ ]  Blood in stool, [ ]  Peptic ulcer disease,  [ ]  Gastroesophageal Reflux, [ ]  Trouble swallowing, [ ]  Diarrhea, [ ]  Constipation  Urinary: [ ]  chronic Kidney disease, [ ]  on HD, [ ]  Burning with urination, [ ]  Frequent urination, [ ]  Difficulty urinating;   Skin: [ ]  Rashes, [ ]  Wounds    Physical Examination  Filed Vitals:   01/25/13 0940  BP: 183/75  Pulse:  57  Resp: 16   Filed Weights   01/25/13 0940  Weight: 105 lb (47.628 kg)   Body mass index is 19.85 kg/(m^2).   General: WDWN female in NAD GAIT: normal Eyes: PERRLA Pulmonary:  CTAB, Negative  Rales, Negative rhonchi, & Negative wheezing.  Cardiac: regular Rhythm ,  Positive Murmurs.  VASCULAR EXAM Carotid Bruits Left Right   Negative, but cardiac murmer is referred Negative, but cardiac murmer is referred                                                                                                                                LE Pulses LEFT RIGHT       FEMORAL   palpable   palpable        POPLITEAL  not  palpable   not  palpable       POSTERIOR TIBIAL   palpable   not  palpable        DORSALIS PEDIS      ANTERIOR TIBIAL  palpable    palpable       Gastrointestinal: soft, nontender, BS WNL, no r/g,  negative masses.  Musculoskeletal: Negative muscle atrophy/wasting. M/S 5/5 throughout Extremities without ischemic changes.  Neurologic: A&O X 3; Appropriate Affect ; SENSATION ;normal; MOTOR FUNCTION: normal 5/5 strength in all tested muscle groups Speech is normal CN 2-12 intact, Pain and light touch intact in extremities, Motor exam as listed above.   Non-Invasive Vascular Imaging CAROTID DUPLEX 01/25/2013   Right ICA <40% stenosis Left ICA 40 - 59 % stenosis  Previous carotid studies demonstrated: RICA <40% stenosis, LICA 40 - 59 % stenosis.  These findings are Unchanged from previous exam   Assessment: Kara Santiago is a 79 y.o. female who presents for routine Duplex surveillance [id number]<19% Right ICA  Stenosis and 40-59% left ICA stenosis. The  ICA stenosis is  Unchanged from previous exam.2 Pt. is not a candidate for CEA at this time  Plan: Follow-up in 1 year with  Carotid Duplex scan.   I discussed in depth with the patient the nature of atherosclerosis, and emphasized the importance of maximal medical management including strict  control of blood pressure, blood glucose, and lipid levels, obtaining regular exercise, and continued cessation of smoking.  The patient is aware that without maximal medical management the underlying atherosclerotic disease process will progress, limiting the benefit of any interventions. Pt was given information regarding stroke symptoms and prevention. Thank you for allowing [place] to participate in this patient's care.  Charisse March, RN, MSN, FNP-C [facility] Office: [phone]  Clinic Physician: Early  01/25/2013 9:46 AM

## 2013-02-23 ENCOUNTER — Other Ambulatory Visit: Payer: Self-pay | Admitting: Cardiology

## 2013-03-29 ENCOUNTER — Other Ambulatory Visit: Payer: Self-pay

## 2013-05-25 ENCOUNTER — Other Ambulatory Visit: Payer: Self-pay | Admitting: Cardiology

## 2013-07-16 ENCOUNTER — Other Ambulatory Visit: Payer: Self-pay | Admitting: Family

## 2013-07-16 DIAGNOSIS — Z48812 Encounter for surgical aftercare following surgery on the circulatory system: Secondary | ICD-10-CM

## 2013-07-16 DIAGNOSIS — I6529 Occlusion and stenosis of unspecified carotid artery: Secondary | ICD-10-CM

## 2013-07-30 ENCOUNTER — Other Ambulatory Visit: Payer: Self-pay | Admitting: Family Medicine

## 2013-07-30 DIAGNOSIS — Z1231 Encounter for screening mammogram for malignant neoplasm of breast: Secondary | ICD-10-CM

## 2013-08-10 ENCOUNTER — Ambulatory Visit
Admission: RE | Admit: 2013-08-10 | Discharge: 2013-08-10 | Disposition: A | Discharge status: Home / Self Care | Payer: Medicare Other | Source: Ambulatory Visit | Attending: Family Medicine | Admitting: Family Medicine

## 2013-08-10 DIAGNOSIS — Z1231 Encounter for screening mammogram for malignant neoplasm of breast: Secondary | ICD-10-CM

## 2014-01-09 ENCOUNTER — Encounter: Payer: Self-pay | Admitting: Family

## 2014-01-10 ENCOUNTER — Ambulatory Visit: Payer: Medicare Other | Admitting: Cardiology

## 2014-01-29 ENCOUNTER — Other Ambulatory Visit (HOSPITAL_COMMUNITY): Payer: Medicare Other

## 2014-01-29 ENCOUNTER — Ambulatory Visit: Payer: Medicare Other | Admitting: Family

## 2014-02-07 ENCOUNTER — Encounter: Payer: Self-pay | Admitting: Family

## 2014-02-08 ENCOUNTER — Ambulatory Visit (INDEPENDENT_AMBULATORY_CARE_PROVIDER_SITE_OTHER): Payer: Medicare Other | Admitting: Family

## 2014-02-08 ENCOUNTER — Other Ambulatory Visit: Payer: Self-pay | Admitting: Family

## 2014-02-08 ENCOUNTER — Encounter: Payer: Self-pay | Admitting: Family

## 2014-02-08 ENCOUNTER — Ambulatory Visit (HOSPITAL_COMMUNITY)
Admission: RE | Admit: 2014-02-08 | Discharge: 2014-02-08 | Disposition: A | Discharge status: Home / Self Care | Payer: Medicare Other | Source: Ambulatory Visit | Attending: Family | Admitting: Family

## 2014-02-08 VITALS — BP 177/68 | HR 55 | Resp 14 | Ht 60.0 in | Wt 109.0 lb

## 2014-02-08 DIAGNOSIS — Z9889 Other specified postprocedural states: Secondary | ICD-10-CM | POA: Diagnosis not present

## 2014-02-08 DIAGNOSIS — I6529 Occlusion and stenosis of unspecified carotid artery: Secondary | ICD-10-CM | POA: Diagnosis not present

## 2014-02-08 DIAGNOSIS — Z7982 Long term (current) use of aspirin: Secondary | ICD-10-CM | POA: Insufficient documentation

## 2014-02-08 DIAGNOSIS — Z48812 Encounter for surgical aftercare following surgery on the circulatory system: Secondary | ICD-10-CM

## 2014-02-08 DIAGNOSIS — E119 Type 2 diabetes mellitus without complications: Secondary | ICD-10-CM | POA: Diagnosis not present

## 2014-02-08 DIAGNOSIS — R011 Cardiac murmur, unspecified: Secondary | ICD-10-CM | POA: Diagnosis not present

## 2014-02-08 DIAGNOSIS — Z87891 Personal history of nicotine dependence: Secondary | ICD-10-CM | POA: Insufficient documentation

## 2014-02-08 NOTE — Progress Notes (Signed)
Established Carotid Patient   History of Present Illness  Kara Santiago is a 80 y.o. female who has known carotid stenosis returns for scheduled Duplex surveillance. She has not required carotid artery intervention to date.  Dr. Arbie Cookey has reviewed her Duplex results in the past.  Patient has a negative history of TIA or stroke symptoms, specifically the patient denies amaurosis fugax or monocular blindness,  denies facial drooping, denies hemiplegia, denies receptive or expressive aphasia.  She sees Dr. Jens Som for a known cardiac murmur. The patient denies New Medical or Surgical History.  Pt Diabetic: Yes , states in good control Pt smoker: former smoker, quit 2008   Pt meds include:  Statin : Yes  Betablocker: Yes  ASA: Yes  Other anticoagulants/antiplatelets: none   Past Medical History  Diagnosis Date  . Aortic stenosis   . CAD (coronary artery disease)   . CVD (cerebrovascular disease)   . Hyperlipidemia   . Hypertension   . PVD (peripheral vascular disease)   . Mitral regurgitation   . Aortic insufficiency   . Carotid artery occlusion   . Diabetes mellitus type 2, controlled     Social History History  Substance Use Topics  . Smoking status: Former Smoker -- 0.40 packs/day for 5 years    Types: Cigarettes    Quit date: 05/24/2006  . Smokeless tobacco: Never Used  . Alcohol Use: Yes    Family History Family History  Problem Relation Age of Onset  . Pneumonia    . Heart disease Mother   . Heart disease Sister   . Heart disease Brother   . Heart disease Son     Surgical History Past Surgical History  Procedure Laterality Date  . Tympanomastoidectomy  2002    right  . Cardiac catheterization  04/07/07    By Dr. Excell Seltzer -- 1. Severe LAD stenosis with successful PCI using a drug-eluting stent. 2. Severe left circumflex stenosis with successful PCI using a single  drug-eluting stent.  . Stents  2008    No Known Allergies  Current Outpatient  Prescriptions  Medication Sig Dispense Refill  . aspirin 81 MG tablet Take 81 mg by mouth daily.        Marland Kitchen atorvastatin (LIPITOR) 80 MG tablet TAKE 1 TABLET (80 MG TOTAL) BY MOUTH DAILY.  90 tablet  2  . glipiZIDE (GLUCOTROL) 5 MG tablet Take 5 mg by mouth daily.        Marland Kitchen lisinopril (PRINIVIL,ZESTRIL) 40 MG tablet TAKE 1 TABLET BY MOUTH DAILY  90 tablet  2  . metoprolol (LOPRESSOR) 25 MG tablet Take 1 tablet (25 mg total) by mouth 2 (two) times daily.  180 tablet  3   No current facility-administered medications for this visit.    Review of Systems : See HPI for pertinent positives and negatives.  Physical Examination  Filed Vitals:   02/08/14 1222 02/08/14 1225  BP: 164/71 177/68  Pulse: 56 55  Resp:  14  Height:  5' (1.524 m)  Weight:  109 lb (49.442 kg)  SpO2:  100%   Body mass index is 21.29 kg/(m^2).  General: WDWN female in NAD  GAIT: normal  Eyes: PERRLA  Pulmonary: CTAB, Negative Rales, Negative rhonchi, & Negative wheezing.  Cardiac: regular Rhythm , Positive Murmur.  VASCULAR EXAM  Carotid Bruits  Left  Right    Negative, but cardiac murmer is transmitted Negative, but cardiac murmer is transmitted   LE Pulses  LEFT  RIGHT   FEMORAL  palpable  palpable   POPLITEAL  not palpable  not palpable   POSTERIOR TIBIAL  Not palpable  not palpable   DORSALIS PEDIS  ANTERIOR TIBIAL  palpable  palpable    Gastrointestinal: soft, nontender, BS WNL, no r/g, no masses palpated.  Musculoskeletal: Negative muscle atrophy/wasting. M/S 5/5 throughout Extremities without ischemic changes.  Neurologic: A&O X 3; Appropriate Affect ; SENSATION ;normal; MOTOR FUNCTION: normal 5/5 strength in all tested muscle groups  Speech is normal  CN 2-12 intact, Pain and light touch intact in extremities, Motor exam as listed above.    Non-Invasive Vascular Imaging CAROTID DUPLEX 02/08/2014   CEREBROVASCULAR DUPLEX EVALUATION    INDICATION: Carotid stenosis    PREVIOUS INTERVENTION(S):  N/A    DUPLEX EXAM:     RIGHT  LEFT  Peak Systolic Velocities (cm/s) End Diastolic Velocities (cm/s) Plaque LOCATION Peak Systolic Velocities (cm/s) End Diastolic Velocities (cm/s) Plaque  103 10  CCA PROXIMAL 68 9   83 11  CCA MID 67 11 HT  85 11 HT CCA DISTAL 66 14 HT  207 0 HT ECA 118 0 HT  125 20 HT ICA PROXIMAL 129 24 HT  79 17  ICA MID 232 43 HT  114 19  ICA DISTAL 102 22     1.51 ICA / CCA Ratio (PSV) 3.50  Antegrade Vertebral Flow Antegrade  166 Brachial Systolic Pressure (mmHg) 164  Triphasic Brachial Artery Waveforms Triphasic    Plaque Morphology:  HM = Homogeneous, HT = Heterogeneous, CP = Calcific Plaque, SP = Smooth Plaque, IP = Irregular Plaque  ADDITIONAL FINDINGS:     IMPRESSION: Right internal carotid artery stenosis present of less than 40%. Left internal carotid artery stenosis present in the 40%-59% range at the mid segment. Right external carotid artery stenosis present.    Compared to the previous exam:  Essentially unchanged since previous study on 01/25/2013.    Assessment: Kara Santiago is a 80 y.o. female who presents with asymptomatic minimal right ICA stenosis and 40 - 59 % left ICA stenosis. Essentially unchanged since previous study on 01/25/2013.  Plan: Follow-up in 1 year with Carotid Duplex scan.   I discussed in depth with the patient the nature of atherosclerosis, and emphasized the importance of maximal medical management including strict control of blood pressure, blood glucose, and lipid levels, obtaining regular exercise, and continued cessation of smoking.  The patient is aware that without maximal medical management the underlying atherosclerotic disease process will progress, limiting the benefit of any interventions. The patient was given information about stroke prevention and what symptoms should prompt the patient to seek immediate medical care. Thank you for allowing [place] to participate in this patient's care.  Charisse March,  RN, MSN, FNP-C [facility] [place] Office: [phone]  Clinic Physician: Imogene Burn  02/08/2014 12:43 PM

## 2014-02-08 NOTE — Patient Instructions (Signed)
Stroke Prevention Some medical conditions and behaviors are associated with an increased chance of having a stroke. You may prevent a stroke by making healthy choices and managing medical conditions. HOW CAN I REDUCE MY RISK OF HAVING A STROKE?   Stay physically active. Get at least 30 minutes of activity on most or all days.  Do not smoke. It may also be helpful to avoid exposure to secondhand smoke.  Limit alcohol use. Moderate alcohol use is considered to be:  No more than 2 drinks per day for men.  No more than 1 drink per day for nonpregnant women.  Eat healthy foods. This involves:  Eating 5 or more servings of fruits and vegetables a day.  Making dietary changes that address high blood pressure (hypertension), high cholesterol, diabetes, or obesity.  Manage your cholesterol levels.  Making food choices that are high in fiber and low in saturated fat, trans fat, and cholesterol may control cholesterol levels.  Take any prescribed medicines to control cholesterol as directed by your health care provider.  Manage your diabetes.  Controlling your carbohydrate and sugar intake is recommended to manage diabetes.  Take any prescribed medicines to control diabetes as directed by your health care provider.  Control your hypertension.  Making food choices that are low in salt (sodium), saturated fat, trans fat, and cholesterol is recommended to manage hypertension.  Take any prescribed medicines to control hypertension as directed by your health care provider.  Maintain a healthy weight.  Reducing calorie intake and making food choices that are low in sodium, saturated fat, trans fat, and cholesterol are recommended to manage weight.  Stop drug abuse.  Avoid taking birth control pills.  Talk to your health care provider about the risks of taking birth control pills if you are over 35 years old, smoke, get migraines, or have ever had a blood clot.  Get evaluated for sleep  disorders (sleep apnea).  Talk to your health care provider about getting a sleep evaluation if you snore a lot or have excessive sleepiness.  Take medicines only as directed by your health care provider.  For some people, aspirin or blood thinners (anticoagulants) are helpful in reducing the risk of forming abnormal blood clots that can lead to stroke. If you have the irregular heart rhythm of atrial fibrillation, you should be on a blood thinner unless there is a good reason you cannot take them.  Understand all your medicine instructions.  Make sure that other conditions (such as anemia or atherosclerosis) are addressed. SEEK IMMEDIATE MEDICAL CARE IF:   You have sudden weakness or numbness of the face, arm, or leg, especially on one side of the body.  Your face or eyelid droops to one side.  You have sudden confusion.  You have trouble speaking (aphasia) or understanding.  You have sudden trouble seeing in one or both eyes.  You have sudden trouble walking.  You have dizziness.  You have a loss of balance or coordination.  You have a sudden, severe headache with no known cause.  You have new chest pain or an irregular heartbeat. Any of these symptoms may represent a serious problem that is an emergency. Do not wait to see if the symptoms will go away. Get medical help at once. Call your local emergency services (911 in [place] Do not drive yourself to the hospital. Document Released: 06/17/2004 Document Revised: 09/24/2013 Document Reviewed: 11/10/2012 [employer] Patient Information 2015 [employer]. This information is not intended to replace advice given   to you by your health care provider. Make sure you discuss any questions you have with your health care provider.  

## 2014-02-08 NOTE — Addendum Note (Signed)
Addended by: Adria Dill L on: 02/08/2014 03:49 PM   Modules accepted: Orders

## 2014-02-28 ENCOUNTER — Encounter: Payer: Self-pay | Admitting: Cardiology

## 2014-02-28 ENCOUNTER — Encounter: Payer: Self-pay | Admitting: *Deleted

## 2014-02-28 ENCOUNTER — Ambulatory Visit (INDEPENDENT_AMBULATORY_CARE_PROVIDER_SITE_OTHER): Payer: Medicare Other | Admitting: Cardiology

## 2014-02-28 ENCOUNTER — Ambulatory Visit: Payer: Medicare Other | Admitting: Cardiology

## 2014-02-28 VITALS — BP 153/76 | HR 50 | Ht 60.0 in | Wt 109.3 lb

## 2014-02-28 DIAGNOSIS — I2510 Atherosclerotic heart disease of native coronary artery without angina pectoris: Secondary | ICD-10-CM

## 2014-02-28 DIAGNOSIS — I10 Essential (primary) hypertension: Secondary | ICD-10-CM

## 2014-02-28 DIAGNOSIS — I6529 Occlusion and stenosis of unspecified carotid artery: Secondary | ICD-10-CM

## 2014-02-28 DIAGNOSIS — I359 Nonrheumatic aortic valve disorder, unspecified: Secondary | ICD-10-CM

## 2014-02-28 LAB — BASIC METABOLIC PANEL WITH GFR
BUN: 12 mg/dL (ref 6–23)
CALCIUM: 9.7 mg/dL (ref 8.4–10.5)
CHLORIDE: 103 meq/L (ref 96–112)
CO2: 26 meq/L (ref 19–32)
Creat: 0.8 mg/dL (ref 0.50–1.10)
GFR, Est African American: 77 mL/min
GFR, Est Non African American: 67 mL/min
Glucose, Bld: 85 mg/dL (ref 70–99)
Potassium: 5.2 mEq/L (ref 3.5–5.3)
SODIUM: 141 meq/L (ref 135–145)

## 2014-02-28 LAB — LIPID PANEL
CHOL/HDL RATIO: 3.1 ratio
Cholesterol: 177 mg/dL (ref 0–200)
HDL: 58 mg/dL (ref >39)
LDL CALC: 91 mg/dL (ref 0–99)
Triglycerides: 142 mg/dL (ref <150)
VLDL: 28 mg/dL (ref 0–40)

## 2014-02-28 LAB — HEPATIC FUNCTION PANEL
ALBUMIN: 4.2 g/dL (ref 3.5–5.2)
ALT: 12 U/L (ref 0–35)
AST: 18 U/L (ref 0–37)
Alkaline Phosphatase: 97 U/L (ref 39–117)
BILIRUBIN TOTAL: 0.6 mg/dL (ref 0.2–1.2)
Bilirubin, Direct: 0.1 mg/dL (ref 0.0–0.3)
Indirect Bilirubin: 0.5 mg/dL (ref 0.2–1.2)
Total Protein: 7.2 g/dL (ref 6.0–8.3)

## 2014-02-28 NOTE — Progress Notes (Signed)
      HPI: Pleasant female with past medical history of coronary artery disease status post drug-eluting stent to the LAD and circumflex in November of 2008, AS/AI for fu. Last echocardiogram was performed in August of 2014. LV function was normal. There was moderate aortic stenosis with a mean gradient of 34 mm mercury. Mild aortic insufficiency. There was moderate MR. Myoview in August of 2013 showed normal perfusion and an ejection fraction of 60%. Cerebrovascular disease followed by vascular surgery. Since I last saw her, She denies dyspnea, chest pain, palpitations or syncope.   Current Outpatient Prescriptions  Medication Sig Dispense Refill  . aspirin 81 MG tablet Take 81 mg by mouth daily.        Marland Kitchen. atorvastatin (LIPITOR) 80 MG tablet TAKE 1 TABLET (80 MG TOTAL) BY MOUTH DAILY.  90 tablet  2  . glipiZIDE (GLUCOTROL) 5 MG tablet Take 5 mg by mouth daily.        Marland Kitchen. lisinopril (PRINIVIL,ZESTRIL) 40 MG tablet TAKE 1 TABLET BY MOUTH DAILY  90 tablet  2  . metoprolol (LOPRESSOR) 25 MG tablet Take 1 tablet (25 mg total) by mouth 2 (two) times daily.  180 tablet  3   No current facility-administered medications for this visit.     Past Medical History  Diagnosis Date  . Aortic stenosis   . CAD (coronary artery disease)   . CVD (cerebrovascular disease)   . Hyperlipidemia   . Hypertension   . PVD (peripheral vascular disease)   . Mitral regurgitation   . Aortic insufficiency   . Carotid artery occlusion   . Diabetes mellitus type 2, controlled     Past Surgical History  Procedure Laterality Date  . Tympanomastoidectomy  2002    right  . Cardiac catheterization  04/07/07    By Dr. Excell Seltzerooper -- 1. Severe LAD stenosis with successful PCI using a drug-eluting stent. 2. Severe left circumflex stenosis with successful PCI using a single  drug-eluting stent.  . Stents  2008    History   Social History  . Marital Status: Widowed    Spouse Name: N/A    Number of Children: N/A  .  Years of Education: N/A   Occupational History  . 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 Smoking status: Former Smoker -- 0.40 packs/day for 5 years    Types: Cigarettes    Quit date: 05/24/2006  . Smokeless tobacco: Never Used  . Alcohol Use: Yes  . Drug Use: No  . Sexual Activity: Not on file   Other Topics Concern  . Not on file   Social History Narrative  . No narrative on file    ROS: no fevers or chills, productive cough, hemoptysis, dysphasia, odynophagia, melena, hematochezia, dysuria, hematuria, rash, seizure activity, orthopnea, PND, pedal edema, claudication. Remaining systems are negative.  Physical Exam: Well-developed well-nourished in no acute distress.  Skin is warm and dry.  HEENT is normal.  Neck is supple.  Chest is clear to auscultation with normal expansion.  Cardiovascular exam is regular rate and rhythm. 3/6 systolic murmur left sternal border. Abdominal exam nontender or distended. No masses palpated. Extremities show no edema. neuro grossly intact  ECG Sinus bradycardia, left bundle branch block.

## 2014-02-28 NOTE — Assessment & Plan Note (Signed)
Blood pressure is mildly elevated today but she has not taken her medications this morning. I have asked her to follow this and we'll adjust as needed. Check potassium and renal function.

## 2014-02-28 NOTE — Assessment & Plan Note (Signed)
Continue aspirin and statin. 

## 2014-02-28 NOTE — Assessment & Plan Note (Signed)
Continue statin. Check lipids and liver. 

## 2014-02-28 NOTE — Patient Instructions (Addendum)
Your physician wants you to follow-up in: ONE YEAR WITH DR CRENSHAW You will receive a reminder letter in the mail two months in advance. If you don't receive a letter, please call our office to schedule the follow-up appointment.   Your physician has requested that you have an echocardiogram. Echocardiography is a painless test that uses sound waves to create images of your heart. It provides your doctor with information about the size and shape of your heart and how well your heart's chambers and valves are working. This procedure takes approximately one hour. There are no restrictions for this procedure.   Your physician recommends that you HAVE LAB WORK TODAY 

## 2014-02-28 NOTE — Assessment & Plan Note (Signed)
Continue aspirin and statin. Followed by vascular surgery. 

## 2014-02-28 NOTE — Assessment & Plan Note (Signed)
Plan repeat echocardiogram. 

## 2014-03-04 ENCOUNTER — Ambulatory Visit (HOSPITAL_COMMUNITY)
Admission: RE | Admit: 2014-03-04 | Discharge: 2014-03-04 | Disposition: A | Discharge status: Home / Self Care | Payer: Medicare Other | Source: Ambulatory Visit | Attending: Cardiovascular Disease | Admitting: Cardiovascular Disease

## 2014-03-04 DIAGNOSIS — I10 Essential (primary) hypertension: Secondary | ICD-10-CM | POA: Insufficient documentation

## 2014-03-04 DIAGNOSIS — I739 Peripheral vascular disease, unspecified: Secondary | ICD-10-CM | POA: Insufficient documentation

## 2014-03-04 DIAGNOSIS — I2510 Atherosclerotic heart disease of native coronary artery without angina pectoris: Secondary | ICD-10-CM | POA: Diagnosis not present

## 2014-03-04 DIAGNOSIS — E119 Type 2 diabetes mellitus without complications: Secondary | ICD-10-CM | POA: Diagnosis not present

## 2014-03-04 DIAGNOSIS — I359 Nonrheumatic aortic valve disorder, unspecified: Secondary | ICD-10-CM | POA: Diagnosis present

## 2014-03-04 NOTE — Progress Notes (Signed)
2D Echo Performed 03/04/2014    Ramelo Oetken, RCS  

## 2014-05-21 ENCOUNTER — Other Ambulatory Visit: Payer: Self-pay | Admitting: Cardiology

## 2014-05-21 MED ORDER — LISINOPRIL 40 MG PO TABS: 40.0000 mg | ORAL_TABLET | Freq: Every day | ORAL | Status: DC

## 2014-08-23 ENCOUNTER — Telehealth: Payer: Self-pay | Admitting: Cardiology

## 2014-08-23 NOTE — Telephone Encounter (Signed)
Spoke with pt dtr, Aware of dr crenshaw's recommendations.  

## 2014-08-23 NOTE — Telephone Encounter (Signed)
If dyspnea becomes significant or worse, would arrange PAOV Olga MillersBrian Crenshaw

## 2014-08-23 NOTE — Telephone Encounter (Signed)
Spoke with pt dtr, for the last couple of weeks the patient is having SOB that comes and goes. She will have days where she is fine and then days where she is SOB walking from the car into the store. She denies edema or orthopnea. The patient feels it is due to the air being thick and heavy, she is currently at the beach. Dr Jens Somcrenshaw had told them to report any SOB. Will forward for dr Jens Somcrenshaw review

## 2014-08-23 NOTE — Telephone Encounter (Signed)
Please call,pt having shortness of breath.

## 2014-09-30 ENCOUNTER — Telehealth: Payer: Self-pay | Admitting: Cardiology

## 2014-09-30 NOTE — Telephone Encounter (Signed)
Mr.Gregson is calling regarding the shortness of breath that her mom is having . Its not getting any better . Please call .Thanks

## 2014-09-30 NOTE — Telephone Encounter (Signed)
   Pt has had ongoing dyspnea since ~1 month ago - Dr. Jens Somrenshaw had recommended OV w/ extender if not getting better.  Spoke to caller - she states that the patient has appt to see PCP today. We discussed letting PCP make decision on any intervention they can provide - if they defer to us for care from that point, we can set up [facility]

## 2014-10-01 ENCOUNTER — Encounter: Payer: Self-pay | Admitting: Physician Assistant

## 2014-10-01 ENCOUNTER — Ambulatory Visit (INDEPENDENT_AMBULATORY_CARE_PROVIDER_SITE_OTHER): Payer: Medicare Other | Admitting: Physician Assistant

## 2014-10-01 ENCOUNTER — Telehealth: Payer: Self-pay | Admitting: Cardiology

## 2014-10-01 ENCOUNTER — Encounter: Payer: Self-pay | Admitting: *Deleted

## 2014-10-01 VITALS — BP 128/70 | HR 67 | Ht 60.0 in | Wt 108.8 lb

## 2014-10-01 DIAGNOSIS — I2510 Atherosclerotic heart disease of native coronary artery without angina pectoris: Secondary | ICD-10-CM | POA: Diagnosis not present

## 2014-10-01 DIAGNOSIS — I359 Nonrheumatic aortic valve disorder, unspecified: Secondary | ICD-10-CM

## 2014-10-01 DIAGNOSIS — I10 Essential (primary) hypertension: Secondary | ICD-10-CM | POA: Diagnosis not present

## 2014-10-01 DIAGNOSIS — R0602 Shortness of breath: Secondary | ICD-10-CM | POA: Diagnosis not present

## 2014-10-01 DIAGNOSIS — E785 Hyperlipidemia, unspecified: Secondary | ICD-10-CM

## 2014-10-01 DIAGNOSIS — I6523 Occlusion and stenosis of bilateral carotid arteries: Secondary | ICD-10-CM

## 2014-10-01 LAB — CBC WITH DIFFERENTIAL/PLATELET
BASOS ABS: 0 10*3/uL (ref 0.0–0.1)
Basophils Relative: 0.4 % (ref 0.0–3.0)
EOS ABS: 0 10*3/uL (ref 0.0–0.7)
EOS PCT: 0.8 % (ref 0.0–5.0)
HEMATOCRIT: 39.8 % (ref 36.0–46.0)
Hemoglobin: 13.3 g/dL (ref 12.0–15.0)
Lymphocytes Relative: 35.6 % (ref 12.0–46.0)
Lymphs Abs: 2.1 10*3/uL (ref 0.7–4.0)
MCHC: 33.5 g/dL (ref 30.0–36.0)
MCV: 92.7 fl (ref 78.0–100.0)
Monocytes Absolute: 0.4 10*3/uL (ref 0.1–1.0)
Monocytes Relative: 7.5 % (ref 3.0–12.0)
NEUTROS ABS: 3.2 10*3/uL (ref 1.4–7.7)
NEUTROS PCT: 55.7 % (ref 43.0–77.0)
Platelets: 272 10*3/uL (ref 150.0–400.0)
RBC: 4.29 Mil/uL (ref 3.87–5.11)
RDW: 14.3 % (ref 11.5–15.5)
WBC: 5.8 10*3/uL (ref 4.0–10.5)

## 2014-10-01 LAB — BASIC METABOLIC PANEL
BUN: 19 mg/dL (ref 6–23)
CALCIUM: 9.8 mg/dL (ref 8.4–10.5)
CO2: 31 mEq/L (ref 19–32)
Chloride: 105 mEq/L (ref 96–112)
Creatinine, Ser: 0.93 mg/dL (ref 0.40–1.20)
GFR: 60.62 mL/min (ref >60.00)
GLUCOSE: 93 mg/dL (ref 70–99)
Potassium: 4.7 mEq/L (ref 3.5–5.1)
Sodium: 141 mEq/L (ref 135–145)

## 2014-10-01 LAB — PROTIME-INR
INR: 1.1 ratio — ABNORMAL HIGH (ref 0.8–1.0)
PROTHROMBIN TIME: 11.9 s (ref 9.6–13.1)

## 2014-10-01 NOTE — Telephone Encounter (Signed)
Returned call to Baird LyonsCasey - she states patient was seen late yesterday evening and the MD was concerned about her breathing status and recommended eval with cardiology.   Attempted to call patient - phoned disconnected.   Called daughter, DPR on file. She states the number listed for her mother was her personal landline, which she cut off. She provided her mother's cell: [phone]  She states she can get her mother to an appointment this afternoon on flex [facility] - [facility] - she will need to be contact for the appointment as she will need to leave work.   Called scheduler for flex clinic and was informed that appointment will be scheduled at 2pm  Notified daughter, Talbert ForestShirley. She voiced understanding of appointment date/time/location

## 2014-10-01 NOTE — Telephone Encounter (Signed)
Baird [facility]onsCasey is calling [facility] , because Dr. Lillie ColumbiaNifong  Wants [name] to be seen today for Extreme shortness of breath and a possible plural effusion .Marland Kitchen. Please call    Thanks

## 2014-10-01 NOTE — Patient Instructions (Addendum)
Medication Instructions:  1. STOP LASIX  Labwork: TODAY; BMET, CBC W/DIFF, PT/INR  Testing/Procedures: 1. Your physician has requested that you have a cardiac catheterization. Cardiac catheterization is used to diagnose and/or treat various heart conditions. Doctors may recommend this procedure for a number of different reasons. The most common reason is to evaluate chest pain. Chest pain can be a symptom of coronary artery disease (CAD), and cardiac catheterization can show whether plaque is narrowing or blocking your heart's arteries. This procedure is also used to evaluate the valves, as well as measure the blood flow and oxygen levels in different parts of your heart. For further information please visit [email or web address]. Please follow instruction sheet, as given.  2. Your physician has requested that you have an echocardiogram. Echocardiography is a painless test that uses sound waves to create images of your heart. It provides your doctor with information about the size and shape of your heart and how well your heart's chambers and valves are working. This procedure takes approximately one hour. There are no restrictions for this procedure.   Follow-Up: YOU WILL NEED A FOLLOW UP WITH DR. CRENSHAW 2 POST CATH   YOU HAVE A FOLLOW UP WITH SC[facility] WEAVER, [facility] @ 12:10 OK PER Nichalas Coin; DUE TO DR. CRENSHAW NO OPENINGS  Any Other Special Instructions Will Be Listed Below (If Applicable).

## 2014-10-01 NOTE — Progress Notes (Signed)
Cardiology Office Note   Date:  10/01/2014   ID:  Kara Santiago, DOB 08-25-1925, MRN [id number]  PCP:  Kara Libel, MD  Cardiologist:  Dr. Olga Santiago     Chief Complaint  Patient presents with  . Shortness of Breath     History of Present Illness: Kara Santiago is a 81 y.o. female with a hx of CAD s/p DES to LAD and CFX in 03/2007, valvular heart disease with mod to severe AS, mild AI and mod MR and carotid stenosis.  Last seen by Dr. Olga Santiago 02/28/14.  FU echo in 02/2014 demonstrated mod to severe AS.  Mean gradient was 32 mmHg (mean gradient by echo in 8/14 was 34 mmHg).    Patient called in recently with increased dyspnea.  She was seen by her PCP yesterday who recommended FU here.    She tells me that she has been short of breath with minimal exertion over the past 2 weeks.  It is getting worse.  She notes chest burning and soreness associated with it.  Denies radiating symptoms or assoc nausea or diaph.  No syncope.  No near syncope.  No orthopnea, PND, edema or increased abdominal girth.  She feels congested.  But, no cough.  She tells me that she saw her PCP yesterday.  She was told that she had a small pleural effusion and was started on Lasix.  Labs were drawn.  I do not have any records to review.  She does not feel any different after 1 dose of Lasix yesterday.     Studies/Reports Reviewed Today:  Echo 03/04/14 - Mild focal basal hypertrophy of the septum. EF 50% to 55%.  There is akinesis of the basal-mid inferolateral myocardium.  Grade 1 diastolic dysfunction - Aortic valve: Valve mobility was restricted. There was moderate to severe stenosis. There was mild regurgitation. Valve area (VTI): 0.67 cm^2. Valve area (Vmax): 0.64 cm^2. Valve area (Vmean): 0.67 cm^2.  Mean gradient (S): 32 mm Hg. Peak gradient (S): 51 mm Hg. - Mitral valve: There was moderate regurgitation. - Left atrium: The atrium was mildly dilated. - Pulmonary arteries: Systolic  pressure was mildly increased. Impressions:  Basal/mid inferolateral akinesis with overall preserved LV function; grade 1 diastolic dysfunction; mild LAE; moderate MR; calcified aortic valve with moderate to severe AS (moderate by mean gradient; severe by continuity equation); mildly elevated pulmonary pressure.  Carotid US 9/15 RICA < 40% LICA 40-59%  Nuclear 12/29/11 Overall Impression:  Normal stress nuclear study  LV Ejection Fraction: 60%.  LV Wall Motion:  NL LV Function; NL Wall Motion  LHC/PCI 03/2007 LM:  minor luminalirregularities.  LAD:  mid 80% to 90% focal stenosis  LCx:  mid 95% to 99% eccentric stenosis. RCA:  proximal and midportions of 30%and 20% respectively.  LVEF:  EF 55%to 60%. There is mild mitral regurgitation. Calcified aortic valve with 15-20 mm peak gradient, suggestive of mild aortic stenosis. PCI: 2.5 x 18 mm Promus DES to LAD; 3 x 23 mm Promus DES to LCx   Past Medical History  Diagnosis Date  . Aortic stenosis   . CAD (coronary artery disease)   . CVD (cerebrovascular disease)   . Hyperlipidemia   . Hypertension   . PVD (peripheral vascular disease)   . Mitral regurgitation   . Aortic insufficiency   . Carotid artery occlusion   . Diabetes mellitus type 2, controlled     Past Surgical History  Procedure Laterality Date  . Tympanomastoidectomy  2002  right  . Cardiac catheterization  04/07/07    By Dr. Excell Seltzerooper -- 1. Severe LAD stenosis with successful PCI using a drug-eluting stent. 2. Severe left circumflex stenosis with successful PCI using a single  drug-eluting stent.  . Stents  2008     Current Outpatient Prescriptions  Medication Sig Dispense Refill  . aspirin 81 MG tablet Take 81 mg by mouth daily.      Marland Kitchen. atorvastatin (LIPITOR) 80 MG tablet TAKE 1 TABLET (80 MG TOTAL) BY MOUTH DAILY. 90 tablet 2  . furosemide (LASIX) 20 MG tablet Take 20 mg by mouth daily.    Marland Kitchen. glipiZIDE (GLUCOTROL) 5 MG tablet Take 5 mg by mouth daily.        Marland Kitchen. lisinopril (PRINIVIL,ZESTRIL) 40 MG tablet Take 1 tablet (40 mg total) by mouth daily. 90 tablet 2  . metoprolol (LOPRESSOR) 25 MG tablet Take 1 tablet (25 mg total) by mouth 2 (two) times daily. 180 tablet 3   No current facility-administered medications for this visit.    Allergies:   Review of patient's allergies indicates no known allergies.    Social History:  The patient  reports that she quit smoking about 8 years ago. Her smoking use included Cigarettes. She has a 2 pack-year smoking history. She has never used smokeless tobacco. She reports that she drinks alcohol. She reports that she does not use illicit drugs.   Family History:  The patient's family history includes Heart attack in her brother, mother, sister, and son; Heart disease in her brother, mother, sister, and son; Pneumonia in an other family member; Stroke in her maternal uncle.    ROS:   Please see the history of present illness.   Review of Systems  Constitution: Positive for decreased appetite.  Cardiovascular: Positive for dyspnea on exertion.  All other systems reviewed and are negative.    PHYSICAL EXAM: VS:  BP 128/70 mmHg  Pulse 67  Ht 5' (1.524 m)  Wt 108 lb 12.8 oz (49.351 kg)  BMI 21.25 kg/m2    Wt Readings from Last 3 Encounters:  10/01/14 108 lb 12.8 oz (49.351 kg)  02/28/14 109 lb 4.8 oz (49.578 kg)  02/08/14 109 lb (49.442 kg)     GEN: Well nourished, well developed, in no acute distress HEENT: normal Neck: no JVD,  no masses Cardiac:  Normal S1/ diminished S2, RRR; 2/6 harsh systolic murmur RUSB, no rubs or gallops, no edema; carotid upstroke somewhat diminished Respiratory:  Decreased breath sounds bilaterally, no wheezing, rhonchi or rales. No egophony.  GI: soft, nontender, nondistended, + BS MS: no deformity or atrophy Skin: warm and dry  Neuro:  CNs II-XII intact, Strength and sensation are intact Psych: Normal affect   EKG:  EKG is ordered today.  It demonstrates:   NSR,  HR 67, LBBB, frequent PVCs.   Recent Labs: 02/28/2014: ALT 12; BUN 12; Creatinine 0.80; Potassium 5.2; Sodium 141    Lipid Panel    Component Value Date/Time   CHOL 177 02/28/2014 1022   TRIG 142 02/28/2014 1022   HDL 58 02/28/2014 1022   CHOLHDL 3.1 02/28/2014 1022   VLDL 28 02/28/2014 1022   LDLCALC 91 02/28/2014 1022   LDLDIRECT 151.7 12/22/2012 0856      ASSESSMENT AND PLAN:  Shortness of breath She is having rapidly progressing shortness of breath with activity with associated chest discomfort.  I am concerned about symptomatic aortic stenosis vs progressive CAD. She does not look particularly volume overloaded.  I cannot  hear an effusion on on exam.  O2 sats are 97% on RA. She does not feel like her symptoms are like her previous angina.  I reviewed her case today with Dr. Tonny BollmanMichael Cooper (DOD) who also saw the patient.  We recommend cardiac cath to further define her symptoms as well as repeat Echo.  Risks and benefits of cardiac catheterization have been discussed with the patient.  These include bleeding, infection, kidney damage, stroke, heart attack, death.  The patient understands these risks and is willing to proceed.   -  Arrange R and L heart cath ASAP.  -  Arrange 2-D Echo  -  Stop Lasix  -  Labs today:  BMET, CBC, INR.  Coronary artery disease involving native coronary artery of native heart without angina pectoris Question if symptoms related to progressive CAD.  Arrange cardiac cath as noted.  Continue ASA, statin, ACE inhibitor, beta-blocker.  Aortic valve disorder Proceed with cath as noted as well as repeat Echo.  Essential hypertension Controlled.   Hyperlipidemia Continue statin.  Carotid stenosis, bilateral  FU with Vascular Surgery as planned.    Current medicines are reviewed at length with the patient today.  Concerns regarding medicines are as outlined above.  The following changes have been made:    Stop Lasix.   Labs/ tests ordered today  include:   Orders Placed This Encounter  Procedures  . Basic Metabolic Panel (BMET)  . CBC w/Diff  . INR/PT  . EKG 12-Lead  . Echocardiogram    Disposition:   FU with Dr. Olga MillersBrian Crenshaw after cardiac cath.   Signed, Brynda RimScott Corrine Tillis, PA-C, MHS 10/01/2014 2:20 PM    [facility] [address], [place], [place]  [address] Phone: [phone]; Fax: [phone]

## 2014-10-02 ENCOUNTER — Telehealth: Payer: Self-pay | Admitting: *Deleted

## 2014-10-02 NOTE — Telephone Encounter (Signed)
lmom lab work good. Ok for cath 5/16.. Call back if any questions [phone].

## 2014-10-07 ENCOUNTER — Ambulatory Visit (HOSPITAL_COMMUNITY)
Admission: RE | Admit: 2014-10-07 | Discharge: 2014-10-07 | Disposition: A | Discharge status: Home / Self Care | Payer: Medicare Other | Source: Ambulatory Visit | Attending: Cardiovascular Disease | Admitting: Cardiovascular Disease

## 2014-10-07 ENCOUNTER — Encounter (HOSPITAL_COMMUNITY): Payer: Self-pay | Admitting: Cardiovascular Disease

## 2014-10-07 ENCOUNTER — Encounter (HOSPITAL_COMMUNITY)
Admission: RE | Disposition: A | Discharge status: Home / Self Care | Payer: Medicare Other | Source: Ambulatory Visit | Attending: Cardiovascular Disease

## 2014-10-07 DIAGNOSIS — I2510 Atherosclerotic heart disease of native coronary artery without angina pectoris: Secondary | ICD-10-CM | POA: Diagnosis not present

## 2014-10-07 DIAGNOSIS — Z87891 Personal history of nicotine dependence: Secondary | ICD-10-CM | POA: Diagnosis not present

## 2014-10-07 DIAGNOSIS — I6523 Occlusion and stenosis of bilateral carotid arteries: Secondary | ICD-10-CM | POA: Diagnosis not present

## 2014-10-07 DIAGNOSIS — E785 Hyperlipidemia, unspecified: Secondary | ICD-10-CM | POA: Diagnosis not present

## 2014-10-07 DIAGNOSIS — I10 Essential (primary) hypertension: Secondary | ICD-10-CM | POA: Diagnosis not present

## 2014-10-07 DIAGNOSIS — I340 Nonrheumatic mitral (valve) insufficiency: Secondary | ICD-10-CM | POA: Diagnosis not present

## 2014-10-07 DIAGNOSIS — I359 Nonrheumatic aortic valve disorder, unspecified: Secondary | ICD-10-CM

## 2014-10-07 DIAGNOSIS — I350 Nonrheumatic aortic (valve) stenosis: Secondary | ICD-10-CM | POA: Diagnosis present

## 2014-10-07 DIAGNOSIS — E119 Type 2 diabetes mellitus without complications: Secondary | ICD-10-CM | POA: Insufficient documentation

## 2014-10-07 DIAGNOSIS — Z7982 Long term (current) use of aspirin: Secondary | ICD-10-CM | POA: Diagnosis not present

## 2014-10-07 DIAGNOSIS — R0602 Shortness of breath: Secondary | ICD-10-CM

## 2014-10-07 HISTORY — PX: CARDIAC CATHETERIZATION: SHX172

## 2014-10-07 LAB — GLUCOSE, CAPILLARY: GLUCOSE-CAPILLARY: 85 mg/dL (ref 65–99)

## 2014-10-07 LAB — POCT I-STAT 3, VENOUS BLOOD GAS (G3P V)
Acid-base deficit: 3 mmol/L — ABNORMAL HIGH (ref 0.0–2.0)
BICARBONATE: 23.4 meq/L (ref 20.0–24.0)
O2 Saturation: 56 %
PH VEN: 7.329 — AB (ref 7.250–7.300)
TCO2: 25 mmol/L (ref 0–100)
pCO2, Ven: 44.5 mmHg — ABNORMAL LOW (ref 45.0–50.0)
pO2, Ven: 32 mmHg (ref 30.0–45.0)

## 2014-10-07 SURGERY — RIGHT/LEFT HEART CATH AND CORONARY ANGIOGRAPHY
Anesthesia: LOCAL

## 2014-10-07 MED ORDER — SODIUM CHLORIDE 0.9 % IV SOLN: 250.0000 mL | INTRAVENOUS | Status: DC | PRN

## 2014-10-07 MED ORDER — IOHEXOL 350 MG/ML SOLN
INTRAVENOUS | Status: DC | PRN
  Administered 2014-10-07: 55 mL via INTRAVENOUS

## 2014-10-07 MED ORDER — SODIUM CHLORIDE 0.9 % IJ SOLN: 3.0000 mL | INTRAMUSCULAR | Status: DC | PRN

## 2014-10-07 MED ORDER — SODIUM CHLORIDE 0.9 % IV SOLN: INTRAVENOUS | Status: AC

## 2014-10-07 MED ORDER — FENTANYL CITRATE (PF) 100 MCG/2ML IJ SOLN
INTRAMUSCULAR | Status: DC | PRN
  Administered 2014-10-07: 25 ug via INTRAVENOUS

## 2014-10-07 MED ORDER — SODIUM CHLORIDE 0.9 % IJ SOLN: 3.0000 mL | Freq: Two times a day (BID) | INTRAMUSCULAR | Status: DC

## 2014-10-07 MED ORDER — LIDOCAINE HCL (PF) 1 % IJ SOLN
INTRAMUSCULAR | Status: AC
  Filled 2014-10-07: qty 30

## 2014-10-07 MED ORDER — MIDAZOLAM HCL 2 MG/2ML IJ SOLN
INTRAMUSCULAR | Status: AC
  Filled 2014-10-07: qty 2

## 2014-10-07 MED ORDER — HEPARIN (PORCINE) IN NACL 2-0.9 UNIT/ML-% IJ SOLN
INTRAMUSCULAR | Status: AC
  Filled 2014-10-07: qty 1500

## 2014-10-07 MED ORDER — ONDANSETRON HCL 4 MG/2ML IJ SOLN: 4.0000 mg | Freq: Four times a day (QID) | INTRAMUSCULAR | Status: DC | PRN

## 2014-10-07 MED ORDER — ASPIRIN 81 MG PO CHEW: 81.0000 mg | CHEWABLE_TABLET | ORAL | Status: DC

## 2014-10-07 MED ORDER — MIDAZOLAM HCL 2 MG/2ML IJ SOLN
INTRAMUSCULAR | Status: DC | PRN
  Administered 2014-10-07: 1 mg via INTRAVENOUS

## 2014-10-07 MED ORDER — SODIUM CHLORIDE 0.9 % IV SOLN
INTRAVENOUS | Status: DC
  Administered 2014-10-07: 07:00:00 via INTRAVENOUS

## 2014-10-07 MED ORDER — FENTANYL CITRATE (PF) 100 MCG/2ML IJ SOLN
INTRAMUSCULAR | Status: AC
  Filled 2014-10-07: qty 2

## 2014-10-07 MED ORDER — ACETAMINOPHEN 325 MG PO TABS: 650.0000 mg | ORAL_TABLET | ORAL | Status: DC | PRN

## 2014-10-07 SURGICAL SUPPLY — 10 items
CATH INFINITI 5FR MULTPACK ANG (CATHETERS) ×2 IMPLANT
CATH SWAN GANZ 7F STRAIGHT (CATHETERS) ×2 IMPLANT
KIT HEART LEFT (KITS) ×2 IMPLANT
KIT HEART RIGHT NAMIC (KITS) ×2 IMPLANT
PACK CARDIAC CATHETERIZATION (CUSTOM PROCEDURE TRAY) ×2 IMPLANT
SHEATH PINNACLE 5F 10CM (SHEATH) ×2 IMPLANT
SHEATH PINNACLE 7F 10CM (SHEATH) ×2 IMPLANT
SYR MEDRAD MARK V 150ML (SYRINGE) ×2 IMPLANT
TRANSDUCER W/STOPCOCK (MISCELLANEOUS) ×4 IMPLANT
WIRE EMERALD 3MM-J .035X150CM (WIRE) ×2 IMPLANT

## 2014-10-07 NOTE — H&P (View-Only) (Signed)
 Cardiology Office Note   Date:  10/01/2014   ID:  Kara Santiago, DOB 12/07/1925, MRN [id number]  PCP:  NIFONG,TED, MD  Cardiologist:  Dr. Brian Crenshaw     Chief Complaint  Patient presents with  . Shortness of Breath     History of Present Illness: Kara Santiago is a 81 y.o. female with a hx of CAD s/p DES to LAD and CFX in 03/2007, valvular heart disease with mod to severe AS, mild AI and mod MR and carotid stenosis.  Last seen by Dr. Brian Crenshaw 02/28/14.  FU echo in 02/2014 demonstrated mod to severe AS.  Mean gradient was 32 mmHg (mean gradient by echo in 8/14 was 34 mmHg).    Patient called in recently with increased dyspnea.  She was seen by her PCP yesterday who recommended FU here.    She tells me that she has been short of breath with minimal exertion over the past 2 weeks.  It is getting worse.  She notes chest burning and soreness associated with it.  Denies radiating symptoms or assoc nausea or diaph.  No syncope.  No near syncope.  No orthopnea, PND, edema or increased abdominal girth.  She feels congested.  But, no cough.  She tells me that she saw her PCP yesterday.  She was told that she had a small pleural effusion and was started on Lasix.  Labs were drawn.  I do not have any records to review.  She does not feel any different after 1 dose of Lasix yesterday.     Studies/Reports Reviewed Today:  Echo 03/04/14 - Mild focal basal hypertrophy of the septum. EF 50% to 55%.  There is akinesis of the basal-mid inferolateral myocardium.  Grade 1 diastolic dysfunction - Aortic valve: Valve mobility was restricted. There was moderate to severe stenosis. There was mild regurgitation. Valve area (VTI): 0.67 cm^2. Valve area (Vmax): 0.64 cm^2. Valve area (Vmean): 0.67 cm^2.  Mean gradient (S): 32 mm Hg. Peak gradient (S): 51 mm Hg. - Mitral valve: There was moderate regurgitation. - Left atrium: The atrium was mildly dilated. - Pulmonary arteries: Systolic  pressure was mildly increased. Impressions:  Basal/mid inferolateral akinesis with overall preserved LV function; grade 1 diastolic dysfunction; mild LAE; moderate MR; calcified aortic valve with moderate to severe AS (moderate by mean gradient; severe by continuity equation); mildly elevated pulmonary pressure.  Carotid US 9/15 RICA < 40% LICA 40-59%  Nuclear 12/29/11 Overall Impression:  Normal stress nuclear study  LV Ejection Fraction: 60%.  LV Wall Motion:  NL LV Function; NL Wall Motion  LHC/PCI 03/2007 LM:  minor luminalirregularities.  LAD:  mid 80% to 90% focal stenosis  LCx:  mid 95% to 99% eccentric stenosis. RCA:  proximal and midportions of 30%and 20% respectively.  LVEF:  EF 55%to 60%. There is mild mitral regurgitation. Calcified aortic valve with 15-20 mm peak gradient, suggestive of mild aortic stenosis. PCI: 2.5 x 18 mm Promus DES to LAD; 3 x 23 mm Promus DES to LCx   Past Medical History  Diagnosis Date  . Aortic stenosis   . CAD (coronary artery disease)   . CVD (cerebrovascular disease)   . Hyperlipidemia   . Hypertension   . PVD (peripheral vascular disease)   . Mitral regurgitation   . Aortic insufficiency   . Carotid artery occlusion   . Diabetes mellitus type 2, controlled     Past Surgical History  Procedure Laterality Date  . Tympanomastoidectomy  2002      right  . Cardiac catheterization  04/07/07    By Dr. Cooper -- 1. Severe LAD stenosis with successful PCI using a drug-eluting stent. 2. Severe left circumflex stenosis with successful PCI using a single  drug-eluting stent.  . Stents  2008     Current Outpatient Prescriptions  Medication Sig Dispense Refill  . aspirin 81 MG tablet Take 81 mg by mouth daily.      . atorvastatin (LIPITOR) 80 MG tablet TAKE 1 TABLET (80 MG TOTAL) BY MOUTH DAILY. 90 tablet 2  . furosemide (LASIX) 20 MG tablet Take 20 mg by mouth daily.    . glipiZIDE (GLUCOTROL) 5 MG tablet Take 5 mg by mouth daily.        . lisinopril (PRINIVIL,ZESTRIL) 40 MG tablet Take 1 tablet (40 mg total) by mouth daily. 90 tablet 2  . metoprolol (LOPRESSOR) 25 MG tablet Take 1 tablet (25 mg total) by mouth 2 (two) times daily. 180 tablet 3   No current facility-administered medications for this visit.    Allergies:   Review of patient's allergies indicates no known allergies.    Social History:  The patient  reports that she quit smoking about 8 years ago. Her smoking use included Cigarettes. She has a 2 pack-year smoking history. She has never used smokeless tobacco. She reports that she drinks alcohol. She reports that she does not use illicit drugs.   Family History:  The patient's family history includes Heart attack in her brother, mother, sister, and son; Heart disease in her brother, mother, sister, and son; Pneumonia in an other family member; Stroke in her maternal uncle.    ROS:   Please see the history of present illness.   Review of Systems  Constitution: Positive for decreased appetite.  Cardiovascular: Positive for dyspnea on exertion.  All other systems reviewed and are negative.    PHYSICAL EXAM: VS:  BP 128/70 mmHg  Pulse 67  Ht 5' (1.524 m)  Wt 108 lb 12.8 oz (49.351 kg)  BMI 21.25 kg/m2    Wt Readings from Last 3 Encounters:  10/01/14 108 lb 12.8 oz (49.351 kg)  02/28/14 109 lb 4.8 oz (49.578 kg)  02/08/14 109 lb (49.442 kg)     GEN: Well nourished, well developed, in no acute distress HEENT: normal Neck: no JVD,  no masses Cardiac:  Normal S1/ diminished S2, RRR; 2/6 harsh systolic murmur RUSB, no rubs or gallops, no edema; carotid upstroke somewhat diminished Respiratory:  Decreased breath sounds bilaterally, no wheezing, rhonchi or rales. No egophony.  GI: soft, nontender, nondistended, + BS MS: no deformity or atrophy Skin: warm and dry  Neuro:  CNs II-XII intact, Strength and sensation are intact Psych: Normal affect   EKG:  EKG is ordered today.  It demonstrates:   NSR,  HR 67, LBBB, frequent PVCs.   Recent Labs: 02/28/2014: ALT 12; BUN 12; Creatinine 0.80; Potassium 5.2; Sodium 141    Lipid Panel    Component Value Date/Time   CHOL 177 02/28/2014 1022   TRIG 142 02/28/2014 1022   HDL 58 02/28/2014 1022   CHOLHDL 3.1 02/28/2014 1022   VLDL 28 02/28/2014 1022   LDLCALC 91 02/28/2014 1022   LDLDIRECT 151.7 12/22/2012 0856      ASSESSMENT AND PLAN:  Shortness of breath She is having rapidly progressing shortness of breath with activity with associated chest discomfort.  I am concerned about symptomatic aortic stenosis vs progressive CAD. She does not look particularly volume overloaded.  I cannot   hear an effusion on on exam.  O2 sats are 97% on RA. She does not feel like her symptoms are like her previous angina.  I reviewed her case today with Dr. Michael Cooper (DOD) who also saw the patient.  We recommend cardiac cath to further define her symptoms as well as repeat Echo.  Risks and benefits of cardiac catheterization have been discussed with the patient.  These include bleeding, infection, kidney damage, stroke, heart attack, death.  The patient understands these risks and is willing to proceed.   -  Arrange R and L heart cath ASAP.  -  Arrange 2-D Echo  -  Stop Lasix  -  Labs today:  BMET, CBC, INR.  Coronary artery disease involving native coronary artery of native heart without angina pectoris Question if symptoms related to progressive CAD.  Arrange cardiac cath as noted.  Continue ASA, statin, ACE inhibitor, beta-blocker.  Aortic valve disorder Proceed with cath as noted as well as repeat Echo.  Essential hypertension Controlled.   Hyperlipidemia Continue statin.  Carotid stenosis, bilateral  FU with Vascular Surgery as planned.    Current medicines are reviewed at length with the patient today.  Concerns regarding medicines are as outlined above.  The following changes have been made:    Stop Lasix.   Labs/ tests ordered today  include:   Orders Placed This Encounter  Procedures  . Basic Metabolic Panel (BMET)  . CBC w/Diff  . INR/PT  . EKG 12-Lead  . Echocardiogram    Disposition:   FU with Dr. Brian Crenshaw after cardiac cath.   Signed, Grabiela Wohlford, PA-C, MHS 10/01/2014 2:20 PM    [facility] [address], [place], [place]  [address] Phone: [phone]; Fax: [phone]    

## 2014-10-07 NOTE — Progress Notes (Signed)
Site area: rt groin fa and fv sheaths Site Prior to Removal:  Level  0 Pressure Applied For:  20 minutes Manual:   yes Patient Status During Pull:  stable Post Pull Site:  Level  0 Post Pull Instructions Given:  yes Post Pull Pulses Present: yes Dressing Applied:  tegaderm Bedrest begins @  0930 Comments:  none

## 2014-10-07 NOTE — Discharge Instructions (Signed)

## 2014-10-07 NOTE — Interval H&P Note (Signed)
History and Physical Interval Note:  10/07/2014 7:52 AM  Barry BrunnerBeatrice S Viner  has presented today for surgery, with the diagnosis of CP  The various methods of treatment have been discussed with the patient and family. After consideration of risks, benefits and other options for treatment, the patient has consented to  Procedure(s): Right/Left Heart Cath and Coronary Angiography (N/A) as a surgical intervention .  The patient's history has been reviewed, patient examined, no change in status, stable for surgery.  I have reviewed the patient's chart and labs.  Questions were answered to the patient's satisfaction.    Cath Lab Visit (complete for each Cath Lab visit)  Clinical Evaluation Leading to the Procedure:   ACS: No.  Non-ACS:    Anginal Classification: CCS III  Anti-ischemic medical therapy: Minimal Therapy (1 class of medications)  Non-Invasive Test Results: No non-invasive testing performed  Prior CABG: No previous CABG       Tonny Bollmanooper, Colman Birdwell

## 2014-10-08 LAB — POCT I-STAT 3, ART BLOOD GAS (G3+)
ACID-BASE DEFICIT: 4 mmol/L — AB (ref 0.0–2.0)
Bicarbonate: 22.1 mEq/L (ref 20.0–24.0)
O2 SAT: 89 %
TCO2: 23 mmol/L (ref 0–100)
pCO2 arterial: 41.3 mmHg (ref 35.0–45.0)
pH, Arterial: 7.337 — ABNORMAL LOW (ref 7.350–7.450)
pO2, Arterial: 60 mmHg — ABNORMAL LOW (ref 80.0–100.0)

## 2014-10-08 MED FILL — Lidocaine HCl Local Preservative Free (PF) Inj 1%: INTRAMUSCULAR | Qty: 30 | Status: AC

## 2014-10-08 MED FILL — Heparin Sodium (Porcine) 2 Unit/ML in Sodium Chloride 0.9%: INTRAMUSCULAR | Qty: 1500 | Status: AC

## 2014-10-10 ENCOUNTER — Encounter: Payer: Self-pay | Admitting: Surgery

## 2014-10-10 ENCOUNTER — Institutional Professional Consult (permissible substitution) (INDEPENDENT_AMBULATORY_CARE_PROVIDER_SITE_OTHER): Payer: Medicare Other | Admitting: Surgery

## 2014-10-10 VITALS — BP 147/76 | HR 63 | Resp 20 | Ht 60.0 in | Wt 109.0 lb

## 2014-10-10 DIAGNOSIS — I350 Nonrheumatic aortic (valve) stenosis: Secondary | ICD-10-CM

## 2014-10-10 NOTE — Progress Notes (Signed)
Patient ID: Kara Santiago, female   DOB: [birth year], 81 y.o.   MRN: [id number]  [facility]  MULTIDISCIPLINARY HEART VALVE CLINIC    CARDIOTHORACIC SURGERY CONSULTATION REPORT  Referring Provider is Tonny Bollman, MD PCP is Stefanie Libel, MD  Chief Complaint  Patient presents with  . Aortic Stenosis    TAVR eval, review cath & echo    HPI:  The patient is an 81 year old woman with a history of coronary artery disease s/p DES to the LAD and LCX in 03/2007 and moderate to severe aortic stenosis, mild AI and moderate MR by echo in 02/2014. The mean aortic valve gradient was 32 mm Hg and the peak gradient was 51 mm Hg. The dimensionless index was 0.21. The LVEF was 50-55%. She now presents with a 2 week history of dyspnea with mild exertion like walking to her car. She has also had some burning chest discomfort at the same time. She was seen by cardiology and underwent cardiac cath on 10/07/2014 showing patent stents in the LAD and LCX with mild nonobstructive CAD and an EF of 50% with moderate MR. The mean gradient was measured at 23 mm Hg and the peak to peak gradient was 35 mm Hg with a calculated AVA of 0.64 cm2.  She is widowed and lives alone but is currently living with her daughter [name] who is a Engineer, civil (consulting) in the PACU at [facility]. She has been quite active until the past couple weeks.  Past Medical History  Diagnosis Date  . Aortic stenosis   . CAD (coronary artery disease)   . CVD (cerebrovascular disease)   . Hyperlipidemia   . Hypertension   . PVD (peripheral vascular disease)   . Mitral regurgitation   . Aortic insufficiency   . Carotid artery occlusion   . Diabetes mellitus type 2, controlled     Past Surgical History  Procedure Laterality Date  . Tympanomastoidectomy  2002    right  . Cardiac catheterization  04/07/07    By Dr. Excell Seltzer -- 1. Severe LAD stenosis with successful PCI using a drug-eluting stent. 2. Severe left circumflex stenosis with  successful PCI using a single  drug-eluting stent.  . Stents  2008  . Cardiac catheterization N/A 10/07/2014    Procedure: Right/Left Heart Cath and Coronary Angiography;  Surgeon: Tonny Bollman, MD;  Location: [facility] INVASIVE CV LAB;  Service: Cardiovascular;  Laterality: N/A;    Family History  Problem Relation Age of Onset  . Pneumonia    . Heart disease Mother   . Heart disease Sister   . Heart disease Brother   . Heart disease Son   . Heart attack Mother   . Heart attack Sister   . Heart attack Son   . Stroke Maternal Uncle   . Heart attack Brother     History   Social History  . Marital Status: Widowed    Spouse Name: N/A  . Number of Children: N/A  . Years of Education: N/A   Occupational History  . 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 Smoking status: Former Smoker -- 0.40 packs/day for 5 years    Types: Cigarettes    Quit date: 05/24/2006  . Smokeless tobacco: Never Used  . Alcohol Use: Yes  . Drug Use: No  . Sexual Activity: Not on file   Other Topics Concern  . Not on file   Social History Narrative  Current Outpatient Prescriptions  Medication Sig Dispense Refill  . aspirin 81 MG tablet Take 81 mg by mouth daily.      Marland Kitchen atorvastatin (LIPITOR) 80 MG tablet TAKE 1 TABLET (80 MG TOTAL) BY MOUTH DAILY. 90 tablet 2  . glipiZIDE (GLUCOTROL) 5 MG tablet Take 5 mg by mouth daily.      Marland Kitchen ibuprofen (ADVIL,MOTRIN) 200 MG tablet Take 200 mg by mouth every 6 (six) hours as needed for mild pain or moderate pain.    Marland Kitchen lisinopril (PRINIVIL,ZESTRIL) 40 MG tablet Take 1 tablet (40 mg total) by mouth daily. 90 tablet 2  . metoprolol (LOPRESSOR) 25 MG tablet Take 1 tablet (25 mg total) by mouth 2 (two) times daily. 180 tablet 3   No current facility-administered medications for this visit.    No Known Allergies    Review of Systems:   General:  normal appetite, decreased energy, no weight gain, no weight loss, no  fever  Cardiac:  has chest pain with exertion, no chest pain at rest, has SOB with mild exertion, no resting SOB, no PND, no orthopnea, no palpitations, no arrhythmia, no atrial fibrillation, no LE edema, no dizzy spells, no syncope  Respiratory:  has shortness of breath, no home oxygen, no productive cough, no dry cough, no bronchitis, no wheezing, no hemoptysis, no asthma, no pain with inspiration or cough, no sleep apnea, no CPAP at night  GI:   no difficulty swallowing, no reflux, no frequent heartburn, no hiatal hernia, no abdominal pain, no constipation, no diarrhea, no hematochezia, no hematemesis, no melena  GU:   no dysuria,  no frequency, no urinary tract infection, no hematuria, no kidney stones, no kidney disease  Vascular:  no pain suggestive of claudication, no pain in feet, no leg cramps, has varicose veins, no DVT, no non-healing foot ulcer  Neuro:   no stroke, no TIA's, no seizures, no headaches, notemporary blindness one eye,  no slurred speech, no peripheral neuropathy, no chronic pain, no instability of gait, no memory/cognitive dysfunction  Musculoskeletal: no arthritis, no joint swelling, no myalgias, no difficulty walking, no mobility   Skin:   no rash, no itching, no skin infections, no pressure sores or ulcerations  Psych:   no anxiety, no depression, no nervousness, no unusual recent stress  Eyes:   no blurry vision, no floaters, no recent vision changes, no wears glasses or contacts  ENT:   no hearing loss, no loose or painful teeth, partial dentures, last saw dentist 2 years ago. No mouth pain or painful teeth.  Hematologic:  no easy bruising, no abnormal bleeding, no clotting disorder, no frequent epistaxis  Endocrine:  no diabetes, does not check CBG's at home           Physical Exam:   BP 147/76 mmHg  Pulse 63  Resp 20  Ht 5' (1.524 m)  Wt 109 lb (49.442 kg)  BMI 21.29 kg/m2  SpO2 96%  General:  Elderly,   well-appearing  HEENT:  Unremarkable , NCAT,  PERLA, EOMI, oropharynx clear, teeth in fair condition, partial denture,   Neck:   no JVD, no bruits, no adenopathy or thyromegaly  Chest:   clear to auscultation, symmetrical breath sounds, no wheezes, no rhonchi   CV:   RRR, grade III/VI crescendo/decrescendo murmur heard best at RUSB,  no diastolic murmur  Abdomen:  soft, non-tender, no masses or organomegaly  Extremities:  warm, well-perfused, pulses in feet are palpable, no LE edema  Rectal/GU  Deferred  Neuro:   Grossly non-focal and symmetrical throughout  Skin:   Clean and dry, no rashes, no breakdown   Diagnostic Tests:  *Cardiovascular Imaging at [facility]         [address], [address]            [place], [place] [address]              [phone]  ------------------------------------------------------------------- Transthoracic Echocardiography  Patient:  Larrisha, Babineau MR #:    [id number] Study Date: 03/04/2014 Gender:   F Age:    28 Height:   152.4 cm Weight:   49.4 kg BSA:    1.45 m^2 Pt. Status: Room:  ORDERING   Olga Millers REFERRING  Arlys John Crenshaw ATTENDING  Thurmon Fair, MD SONOGRAPHER Clearence Ped, RCS PERFORMING  [facility], Outpatient  cc:  ------------------------------------------------------------------- LV EF: 50% -  55%  ------------------------------------------------------------------- Indications:   424.1 Aortic valve disorders.  ------------------------------------------------------------------- History:  PMH: HTN, PVD, CVD. Coronary artery disease. Risk factors: Diabetes mellitus.  ------------------------------------------------------------------- Study Conclusions  - Left ventricle: The cavity size was normal. There was mild focal basal hypertrophy of the septum. Systolic function was normal. The estimated ejection fraction was in the range of 50% to 55%. There is akinesis of the  basal-midinferolateral myocardium. Doppler parameters are consistent with abnormal left ventricular relaxation (grade 1 diastolic dysfunction). Doppler parameters are consistent with high ventricular filling pressure. - Aortic valve: Valve mobility was restricted. There was moderate to severe stenosis. There was mild regurgitation. Valve area (VTI): 0.67 cm^2. Valve area (Vmax): 0.64 cm^2. Valve area (Vmean): 0.67 cm^2. - Mitral valve: There was moderate regurgitation. - Left atrium: The atrium was mildly dilated. - Pulmonary arteries: Systolic pressure was mildly increased.  Impressions:  - Basal/mid inferolateral akinesis with overall preserved LV function; grade 1 diastolic dysfunction; mild LAE; moderate MR; calcified aortic valve with moderate to severe AS (moderate by mean gradient; severe by continuity equation); mildly elevated pulmonary pressure.  Transthoracic echocardiography. M-mode, complete 2D, spectral Doppler, and color Doppler. Birthdate: Patient birthdate: [birth year]. Age: Patient is 81 yr old. Sex: Gender: female. BMI: 21.3 kg/m^2. Blood pressure:   168/69 Patient status: Outpatient. Study date: Study date: 03/04/2014. Study time: 08:57 AM. Location: Echo laboratory.  -------------------------------------------------------------------  ------------------------------------------------------------------- Left ventricle: The cavity size was normal. There was mild focal basal hypertrophy of the septum. Systolic function was normal. The estimated ejection fraction was in the range of 50% to 55%. Regional wall motion abnormalities:  There is akinesis of the basal-midinferolateral myocardium. Doppler parameters are consistent with abnormal left ventricular relaxation (grade 1 diastolic dysfunction). Doppler parameters are consistent with high ventricular filling  pressure.  ------------------------------------------------------------------- Aortic valve:  Trileaflet; severely calcified leaflets. Valve mobility was restricted. Doppler:  There was moderate to severe stenosis.  There was mild regurgitation.  VTI ratio of LVOT to aortic valve: 0.21. Valve area (VTI): 0.67 cm^2. Indexed valve area (VTI): 0.46 cm^2/m^2. Peak velocity ratio of LVOT to aortic valve: 0.21. Valve area (Vmax): 0.64 cm^2. Indexed valve area (Vmax): 0.44 cm^2/m^2. Mean velocity ratio of LVOT to aortic valve: 0.21. Valve area (Vmean): 0.67 cm^2. Indexed valve area (Vmean): 0.46 cm^2/m^2.  Mean gradient (S): 32 mm Hg. Peak gradient (S): 51 mm Hg.  ------------------------------------------------------------------- Aorta: Aortic root: The aortic root was normal in size.  ------------------------------------------------------------------- Mitral valve:  Structurally normal valve.  Mobility was not restricted. Doppler: Transvalvular velocity was within the normal range. There was no evidence for stenosis. There was moderate regurgitation.  Peak gradient (D):  2 mm Hg.  ------------------------------------------------------------------- Left atrium: LA Volume/ BSA = 37.5 ml/m2. The atrium was mildly dilated.  ------------------------------------------------------------------- Right ventricle: The cavity size was normal. Systolic function was normal.  ------------------------------------------------------------------- Pulmonic valve:  Doppler: Transvalvular velocity was within the normal range. There was no evidence for stenosis. There was trivial regurgitation.  ------------------------------------------------------------------- Tricuspid valve:  Structurally normal valve.  Doppler: Transvalvular velocity was within the normal range. There was mild regurgitation.  ------------------------------------------------------------------- Pulmonary artery:   Systolic pressure was mildly increased.  ------------------------------------------------------------------- Right atrium: The atrium was normal in size.  ------------------------------------------------------------------- Pericardium: There was no pericardial effusion.  ------------------------------------------------------------------- Systemic veins: Inferior vena cava: The vessel was normal in size. The respirophasic diameter changes were in the normal range (= 50%), consistent with normal central venous pressure. Diameter: 15 mm.  ------------------------------------------------------------------- Measurements  IVC                    Value     Reference ID                    15  mm    ---------  Left ventricle              Value     Reference LV ID, ED, PLAX chordal          49.1 mm    43 - 52 LV ID, ES, PLAX chordal          36.2 mm    23 - 38 LV fx shortening, PLAX chordal  (L)   26  %    >=29 LV PW thickness, ED            9.7  mm    --------- IVS/LV PW ratio, ED        (H)   1.4      <=1.3 Stroke volume, 2D             68  ml    --------- Stroke volume/bsa, 2D           47  ml/m^2  --------- LV e&', lateral              5.7  cm/s   --------- LV E/e&', lateral             12.65     --------- LV e&', medial               4.5  cm/s   --------- LV E/e&', medial              16.02     --------- LV e&', average              5.1  cm/s   --------- LV E/e&', average             14.14     ---------  Ventricular septum            Value     Reference IVS thickness, ED             13.6 mm    ---------  LVOT                   Value      Reference LVOT ID, S                20  mm    --------- LVOT area  3.14 cm^2   --------- LVOT peak velocity, S           73.3 cm/s   --------- LVOT mean velocity, S           53.6 cm/s   --------- LVOT VTI, S                21.5 cm    ---------  Aortic valve               Value     Reference Aortic valve peak velocity, S       357  cm/s   --------- Aortic valve mean velocity, S       253  cm/s   --------- Aortic valve VTI, S            101  cm    --------- Aortic mean gradient, S          32  mm Hg  --------- Aortic peak gradient, S          51  mm Hg  --------- VTI ratio, LVOT/AV            0.21      --------- Aortic valve area, VTI          0.67 cm^2   --------- Aortic valve area/bsa, VTI        0.46 cm^2/m^2 --------- Velocity ratio, peak, LVOT/AV       0.21      --------- Aortic valve area, peak velocity     0.64 cm^2   --------- Aortic valve area/bsa, peak        0.44 cm^2/m^2 --------- velocity Velocity ratio, mean, LVOT/AV       0.21      --------- Aortic valve area, mean velocity     0.67 cm^2   --------- Aortic valve area/bsa, mean        0.46 cm^2/m^2 --------- velocity Aortic regurg pressure half-time     670  ms    ---------  Aorta                   Value     Reference Aortic root ID, ED            31  mm    ---------  Left atrium                Value     Reference LA ID, A-P, ES              42  mm    --------- LA ID/bsa, A-P          (H)   2.9  cm/m^2  <=2.2 LA volume, ES, 1-p A4C          42  ml    --------- LA volume/bsa, ES, 1-p A4C        29   ml/m^2  --------- LA volume, ES, 1-p A2C          59  ml    --------- LA volume/bsa, ES, 1-p A2C        40.7 ml/m^2  ---------  Mitral valve               Value     Reference Mitral E-wave peak velocity        72.1 cm/s   --------- Mitral A-wave peak velocity        89.3 cm/s   --------- Mitral deceleration time     (H)  380  ms    150 - 230 Mitral peak gradient, D          2   mm Hg  --------- Mitral E/A ratio, peak          0.8      --------- Mitral regurg VTI, PISA          242  cm    --------- Mitral ERO, PISA             0.05 cm^2   --------- Mitral regurg volume, PISA        12  ml    ---------  Pulmonary arteries            Value     Reference PA pressure, S, DP            29  mm Hg  <=30  Tricuspid valve              Value     Reference Tricuspid regurg peak velocity      257  cm/s   --------- Tricuspid peak RV-RA gradient       26  mm Hg  --------- Tricuspid maximal regurg         257  cm/s   --------- velocity, PISA  Systemic veins              Value     Reference Estimated CVP               3   mm Hg  ---------  Right ventricle              Value     Reference RV pressure, S, DP            29  mm Hg  <=30 RV s&', lateral, S             10.4 cm/s   ---------  Legend: (L) and (H) mark values outside specified reference range.  ------------------------------------------------------------------- Prepared and Electronically Authenticated by  Olga Millers 2015-10-12T11:39:14     Cardiac Cath:  Coronary Findings    Dominance: Right   Left Main  The vessel is angiographically normal.     Left Anterior Descending   .  Prox LAD lesion, 0% stenosed. Previously placed Prox LAD drug eluting stent is patent.   . Mid LAD lesion, 30% stenosed. calcified, diffuse . Mid-LAD is tortuous     Left Circumflex   . Prox Cx lesion, 30% stenosed.   . Mid Cx lesion, 0% stenosed. Previously placed Mid Cx drug eluting stent is patent.     Right Coronary Artery   . Prox RCA lesion, 20% stenosed.       Right Heart Pressures Hemodynamic findings consistent with aortic stenosis. LV EDP is normal. Aortic Valve mean gradient 23 mmHg, peak-to-peak 35 mm Hg, AVA 0.64    Wall Motion                 Left Heart    Left Ventricle The left ventricular size is normal. There is mild left ventricular systolic dysfunction. There are wall motion abnormalities in the left ventricle. There are segmental wall motion abnormalities in the left ventricle.   Mitral Valve There is moderate (3+) mitral regurgitation.   Aortic Valve There is severe aortic valve stenosis. The aortic valve is calcified. There is restricted aortic valve motion.    Coronary Diagrams    Diagnostic Diagram  Hemo Data       Most Recent Value   Fick Cardiac Output  3.21 L/min   Fick Cardiac Output Index  2.23 (L/min)/BSA   Aortic Mean Gradient  23.3 mmHg   Aortic Peak Gradient  29 mmHg   RA A Wave  3 mmHg   RA V Wave  3 mmHg   RA Mean  2 mmHg   RV Systolic Pressure  26 mmHg   RV Diastolic Pressure  0 mmHg   RV EDP  2 mmHg   PA Systolic Pressure  33 mmHg   PA Diastolic Pressure  5 mmHg   PA Mean  16 mmHg   PW A Wave  7 mmHg   PW V Wave  4 mmHg   PW Mean  5 mmHg   AO Systolic Pressure  132 mmHg   AO Diastolic Pressure  49 mmHg   AO Mean  74 mmHg   LV Systolic Pressure  165 mmHg   LV Diastolic Pressure  6 mmHg   LV EDP  7 mmHg   Arterial Occlusion Pressure Extended Systolic Pressure  132 mmHg   Arterial Occlusion Pressure Extended Diastolic Pressure  49 mmHg   Arterial Occlusion Pressure  Extended Mean Pressure  78 mmHg   Left Ventricular Apex Extended Systolic Pressure  167 mmHg   Left Ventricular Apex Extended Diastolic Pressure  1 mmHg   Left Ventricular Apex Extended EDP Pressure  6 mmHg    Implants    Name ID Temporary Type Supply   No information to display    Hemo Data       Most Recent Value   Fick Cardiac Output  3.21 L/min   Fick Cardiac Output Index  2.23 (L/min)/BSA   Aortic Mean Gradient  23.3 mmHg   Aortic Peak Gradient  29 mmHg   RA A Wave  3 mmHg   RA V Wave  3 mmHg   RA Mean  2 mmHg   RV Systolic Pressure  26 mmHg   RV Diastolic Pressure  0 mmHg   RV EDP  2 mmHg   PA Systolic Pressure  33 mmHg   PA Diastolic Pressure  5 mmHg   PA Mean  16 mmHg   PW A Wave  7 mmHg   PW V Wave  4 mmHg   PW Mean  5 mmHg   AO Systolic Pressure  132 mmHg   AO Diastolic Pressure  49 mmHg   AO Mean  74 mmHg   LV Systolic Pressure  165 mmHg   LV Diastolic Pressure  6 mmHg   LV EDP  7 mmHg   Arterial Occlusion Pressure Extended Systolic Pressure  132 mmHg   Arterial Occlusion Pressure Extended Diastolic Pressure  49 mmHg   Arterial Occlusion Pressure Extended Mean Pressure  78 mmHg   Left Ventricular Apex Extended Systolic Pressure  167 mmHg   Left Ventricular Apex Extended Diastolic Pressure  1 mmHg   Left Ventricular Apex Extended EDP Pressure  6 mmHg     Conclusion    1. Patent stents in the LAD and LCx without significant restenosis 2. Mild nonobstructive CAD 3. Mild segmental contraction abnormality of the LV with preserved LVEF 50% 4. Moderate mitral regurgitation 5. Severe aortic stenosis  Without significant CAD, suspect symptoms are related to valvular heart disease. Plan to update echo and refer to [facility] for consideration of TAVR.  STS Risk Calculator Procedure: AV Replacement  Risk of Mortality: 4.753%  Morbidity or Mortality: 21.097%  Long Length of Stay: 9.133%   Short Length of Stay: 22.323%  Permanent Stroke: 3.309%  Prolonged Ventilation: 13.035%  DSW Infection: 0.096%  Renal Failure: 4.686%  Reoperation: 9.965%    Impression:  This patient has stage D severe symptomatic aortic stenosis with NYHA class III symptoms of dyspnea and fatigue with minimal exertion. I have personally reviewed her echo from 02/2014 and her recent cardiac cath. She has a trileaflet aortic valve that is severely calcified with markedly restricted leaflet mobility. Although her mean gradient on echo was only 32 mm Hg, her dimensionless index was 0.21 consistent with severe AS and her valve looks like a severely stenotic valve. Her gradient by cath was only in the moderate range but this frequently does not correlate with the degree of stenosis. She has been scheduled for a repeat echo next week. Her cath shows patent stents and non-obstructive coronary disease. She did have moderate MR by echo and cath. Her ascending aorta is calcified on fluoroscopy. I think aortic valve replacement is indicated in this patient. She would be a very high risk candidate for conventional open surgical AVR due to her age, calcified ascending aorta that may require aortic replacement, and moderate MR that may require repair at the same time. I think TAVR would be the best option for treating her aortic stenosis. Her STS PROM is only 4.75 % but I think it is probably much higher due to her calcified aorta and moderate MR.  The patient has been counseled extensively as to the relative risks and benefits of all options for the treatment of severe aortic stenosis including long term medical therapy, conventional surgery for aortic valve replacement, and transcatheter aortic valve replacement.  She would like to proceed with TAVR workup. She will require a gated cardiac CT and CTA of the chest, abdomen, and pelvis. After these have been completed she will return to see Dr. Cornelius Moraswen for a second surgical  opinion.  Plan:  1. Gated cardiac CT  2. CTA of the chest, abdomen, and pelvis  3. Second surgical opinion with Dr. Cornelius Moraswen  4. PT evaluation  5. PFT's    Alleen BorneBryan K. Bartle, MD

## 2014-10-11 ENCOUNTER — Other Ambulatory Visit: Payer: Self-pay | Admitting: *Deleted

## 2014-10-11 DIAGNOSIS — I350 Nonrheumatic aortic (valve) stenosis: Secondary | ICD-10-CM

## 2014-10-16 ENCOUNTER — Ambulatory Visit (HOSPITAL_COMMUNITY): Payer: Medicare Other | Attending: Cardiology

## 2014-10-16 ENCOUNTER — Other Ambulatory Visit: Payer: Self-pay

## 2014-10-16 ENCOUNTER — Encounter: Payer: Self-pay | Admitting: Physician Assistant

## 2014-10-16 DIAGNOSIS — I359 Nonrheumatic aortic valve disorder, unspecified: Secondary | ICD-10-CM | POA: Diagnosis not present

## 2014-10-16 DIAGNOSIS — I272 Other secondary pulmonary hypertension: Secondary | ICD-10-CM | POA: Insufficient documentation

## 2014-10-16 DIAGNOSIS — I340 Nonrheumatic mitral (valve) insufficiency: Secondary | ICD-10-CM | POA: Diagnosis not present

## 2014-10-16 DIAGNOSIS — I350 Nonrheumatic aortic (valve) stenosis: Secondary | ICD-10-CM | POA: Diagnosis not present

## 2014-10-18 ENCOUNTER — Telehealth: Payer: Self-pay | Admitting: *Deleted

## 2014-10-18 NOTE — Telephone Encounter (Signed)
Pt's daughter Talbert ForestShirley cb who is caregiver for pt. Daughter notified of echo results and recommendations to continue w/plans to f/u w/ Dr. Laneta SimmersBartle to discuss poss surgery for aortic valve replacement. Talbert ForestShirley verbalized understanding by phone.

## 2014-10-23 ENCOUNTER — Telehealth: Payer: Self-pay | Admitting: Physician Assistant

## 2014-10-23 ENCOUNTER — Ambulatory Visit (HOSPITAL_COMMUNITY)
Admission: RE | Admit: 2014-10-23 | Discharge: 2014-10-23 | Disposition: A | Discharge status: Home / Self Care | Payer: Medicare Other | Source: Ambulatory Visit | Attending: Surgery | Admitting: Surgery

## 2014-10-23 ENCOUNTER — Ambulatory Visit (HOSPITAL_COMMUNITY): Payer: Medicare Other

## 2014-10-23 ENCOUNTER — Encounter (HOSPITAL_COMMUNITY): Payer: Self-pay

## 2014-10-23 DIAGNOSIS — I350 Nonrheumatic aortic (valve) stenosis: Secondary | ICD-10-CM

## 2014-10-23 DIAGNOSIS — Z01818 Encounter for other preprocedural examination: Secondary | ICD-10-CM | POA: Insufficient documentation

## 2014-10-23 LAB — PULMONARY FUNCTION TEST
DL/VA % pred: 83 %
DL/VA: 3.4 ml/min/mmHg/L
DLCO UNC: 9.03 ml/min/mmHg
DLCO unc % pred: 51 %
FEF 25-75 POST: 1.11 L/s
FEF 25-75 Pre: 0.66 L/sec
FEF2575-%Change-Post: 69 %
FEF2575-%PRED-POST: 146 %
FEF2575-%Pred-Pre: 86 %
FEV1-%CHANGE-POST: 12 %
FEV1-%Pred-Post: 104 %
FEV1-%Pred-Pre: 93 %
FEV1-Post: 1.29 L
FEV1-Pre: 1.15 L
FEV1FVC-%CHANGE-POST: 7 %
FEV1FVC-%Pred-Pre: 95 %
FEV6-%CHANGE-POST: 5 %
FEV6-%PRED-POST: 108 %
FEV6-%Pred-Pre: 103 %
FEV6-POST: 1.72 L
FEV6-PRE: 1.63 L
FEV6FVC-%Change-Post: 1 %
FEV6FVC-%Pred-Post: 107 %
FEV6FVC-%Pred-Pre: 105 %
FVC-%Change-Post: 4 %
FVC-%PRED-POST: 102 %
FVC-%Pred-Pre: 97 %
FVC-Post: 1.75 L
FVC-Pre: 1.67 L
POST FEV6/FVC RATIO: 98 %
PRE FEV1/FVC RATIO: 69 %
Post FEV1/FVC ratio: 74 %
Pre FEV6/FVC Ratio: 97 %
RV % pred: 88 %
RV: 2.01 L
TLC % PRED: 87 %
TLC: 3.77 L

## 2014-10-23 MED ORDER — IOHEXOL 350 MG/ML SOLN
80.0000 mL | Freq: Once | INTRAVENOUS | Status: AC | PRN
  Administered 2014-10-23: 80 mL via INTRAVENOUS

## 2014-10-23 MED ORDER — IOHEXOL 350 MG/ML SOLN
80.0000 mL | Freq: Once | INTRAVENOUS | Status: AC | PRN
Start: 2014-10-23 — End: 2014-10-23
  Administered 2014-10-23: 80 mL via INTRAVENOUS

## 2014-10-23 MED ORDER — ALBUTEROL SULFATE (2.5 MG/3ML) 0.083% IN NEBU
2.5000 mg | INHALATION_SOLUTION | Freq: Once | RESPIRATORY_TRACT | Status: AC
  Administered 2014-10-23: 2.5 mg via RESPIRATORY_TRACT

## 2014-10-23 NOTE — Telephone Encounter (Signed)
New Prob    Pts daughter has some questions regarding pts upcoming follow up appointment with Lorin PicketScott. Please call.

## 2014-10-25 ENCOUNTER — Ambulatory Visit: Payer: Medicare Other | Admitting: Physician Assistant

## 2014-10-25 NOTE — Telephone Encounter (Signed)
S/w pt's daughter Talbert ForestShirley about appt today w/PA. Daughter states she felt that pt did not need to keep the appt w/Scott today since they are preparing with Dr. Laneta SimmersBartle for valve surgery for pt. She stated pt is doing fine and is having no problems.

## 2014-10-30 ENCOUNTER — Other Ambulatory Visit: Payer: Self-pay

## 2014-10-30 ENCOUNTER — Ambulatory Visit: Payer: Medicare Other | Admitting: Surgery

## 2014-10-30 ENCOUNTER — Ambulatory Visit: Payer: Medicare Other | Admitting: Physical Therapy

## 2014-10-30 DIAGNOSIS — I10 Essential (primary) hypertension: Secondary | ICD-10-CM

## 2014-10-30 DIAGNOSIS — I359 Nonrheumatic aortic valve disorder, unspecified: Secondary | ICD-10-CM

## 2014-10-30 MED ORDER — ATORVASTATIN CALCIUM 80 MG PO TABS
ORAL_TABLET | ORAL | Status: DC
Start: 2014-10-30 — End: 2015-09-30

## 2014-10-30 MED ORDER — METOPROLOL TARTRATE 25 MG PO TABS: 25.0000 mg | ORAL_TABLET | Freq: Two times a day (BID) | ORAL | Status: DC

## 2014-10-30 NOTE — Telephone Encounter (Signed)
Per note 5.10.16 

## 2014-11-01 ENCOUNTER — Ambulatory Visit: Payer: Medicare Other | Attending: Surgery | Admitting: Physical Therapy

## 2014-11-01 ENCOUNTER — Encounter: Payer: Self-pay | Admitting: Physical Therapy

## 2014-11-01 ENCOUNTER — Encounter: Payer: Self-pay | Admitting: Surgery

## 2014-11-01 ENCOUNTER — Ambulatory Visit (INDEPENDENT_AMBULATORY_CARE_PROVIDER_SITE_OTHER): Payer: Medicare Other | Admitting: Surgery

## 2014-11-01 ENCOUNTER — Ambulatory Visit: Payer: Medicare Other | Admitting: Surgery

## 2014-11-01 VITALS — BP 129/55 | HR 60 | Resp 16 | Ht 60.0 in | Wt 109.0 lb

## 2014-11-01 DIAGNOSIS — I25119 Atherosclerotic heart disease of native coronary artery with unspecified angina pectoris: Secondary | ICD-10-CM

## 2014-11-01 DIAGNOSIS — I6523 Occlusion and stenosis of bilateral carotid arteries: Secondary | ICD-10-CM | POA: Diagnosis not present

## 2014-11-01 DIAGNOSIS — R262 Difficulty in walking, not elsewhere classified: Secondary | ICD-10-CM | POA: Insufficient documentation

## 2014-11-01 DIAGNOSIS — I350 Nonrheumatic aortic (valve) stenosis: Secondary | ICD-10-CM | POA: Insufficient documentation

## 2014-11-01 NOTE — Therapy (Signed)
[facility] [address] [place], [place], [address] Phone: [phone]   Fax:  [phone]  Physical Therapy Evaluation  Patient Details  Name: Kara Santiago MRN: [id number] Date of Birth: [birth year] Referring Provider:  Alleen Borne, MD  Encounter Date: 11/01/2014      PT End of Session - 11/01/14 0932    Visit Number 1   Number of Visits 1   PT Start Time 0927   PT Stop Time 1003   PT Time Calculation (min) 36 min      Past Medical History  Diagnosis Date  . Aortic stenosis   . CAD (coronary artery disease)   . CVD (cerebrovascular disease)   . Hyperlipidemia   . Hypertension   . PVD (peripheral vascular disease)   . Mitral regurgitation   . Aortic insufficiency   . Carotid artery occlusion   . Diabetes mellitus type 2, controlled   . History of echocardiogram     Echo 5/16: Mild diffuse HK more pronounced in the inferior and inferolateral walls, mild focal basal septal hypertrophy, EF 40%, grade 2 diastolic dysfunction, severe aortic stenosis (mean 34 mmHg, peak 57 mmHg, moderate MR, mild LAE, normal RV function, mild TR, trivial PI, PASP 41 mmHg, trivial effusion    Past Surgical History  Procedure Laterality Date  . Tympanomastoidectomy  2002    right  . Cardiac catheterization  04/07/07    By Dr. Excell Seltzer -- 1. Severe LAD stenosis with successful PCI using a drug-eluting stent. 2. Severe left circumflex stenosis with successful PCI using a single  drug-eluting stent.  . Stents  2008  . Cardiac catheterization N/A 10/07/2014    Procedure: Right/Left Heart Cath and Coronary Angiography;  Surgeon: Tonny Bollman, MD;  Location: [facility] INVASIVE CV LAB;  Service: Cardiovascular;  Laterality: N/A;    There were no vitals filed for this visit.  Visit Diagnosis:  Difficulty walking - Plan: PT plan of care cert/re-cert  Severe aortic stenosis - Plan: PT plan of care cert/re-cert      Subjective Assessment -  11/01/14 0931    Subjective reports increased SOB with activity about 2-3 weeks ago, mainly notices it with community ambulation especially on inclines   Patient Stated Goals improve mobility   Currently in Pain? No/denies            [facility] PT Assessment - 11/01/14 0001    Assessment   Medical Diagnosis severe aortic stenosis   Onset Date/Surgical Date 10/11/14  approximately    Precautions   Precautions None   Restrictions   Weight Bearing Restrictions No   Balance Screen   Has the patient fallen in the past 6 months No   Has the patient had a decrease in activity level because of a fear of falling?  No   Is the patient reluctant to leave their home because of a fear of falling?  No   Home Environment   Living Environment Private residence   Home Access Stairs to enter   Entrance Stairs-Number of Steps 2   Entrance Stairs-Rails None   Home Layout Multi-level  resides on main level   Additional Comments Pt currently staying with daughter until surgery. Pt plans to return home to two level home with laundry and kitchen in basement.   Prior Function   Level of Independence Independent with basic ADLs;Independent with gait   Cognition   Overall Cognitive Status Within Functional Limits for tasks assessed   ROM / Strength  AROM / PROM / Strength AROM;Strength   AROM   Overall AROM  Within functional limits for tasks performed   Strength   Overall Strength Comments grossly 4-5/5 throughout   Strength Assessment Site Hand   Right/Left hand Right;Left   Right Hand Grip (lbs) 35   Left Hand Grip (lbs) 39          [facility] Pre-Surgical Assessment - 2014-11-03 0001    5 Meter Walk Test- trial 1 4.1 sec   5 Meter Walk Test- trial 2 4 sec.    5 Meter Walk Test- trial 3 4.1 sec.  </= 6 sec WNL   5 meter walk test average 4.07 sec   Timed Up & Go Test trial  10.2 sec.   Comments </= 12 seconds WNL   4 Stage Balance Test tolerated for:  4 sec.   4 Stage Balance Test Position 3    comment inability to hold x 10 seconds in position 3 is indicative of increased fall risk   Sit To Stand Test- trial 1 11.9 sec.   Comment </= 14.8 WNL   ADL/IADL Independent with: Bathing;Dressing;Meal prep;Finances   ADL/IADL Needs Assistance with: Pincus Badder work  as of last 2-3 weeks   ADL/IADL Fraility Index Vulnerable   6 Minute Walk- Baseline yes   BP (mmHg) 150/80 mmHg   HR (bpm) 60   02 Sat (%RA) 99 %   Modified Borg Scale for Dyspnea 0- Nothing at all   Perceived Rate of Exertion (Borg) 6-   6 Minute Walk Post Test yes   BP (mmHg) 148/72 mmHg   HR (bpm) 65   02 Sat (%RA) 93 %   Modified Borg Scale for Dyspnea 5- Strong or hard breathing   Perceived Rate of Exertion (Borg) 11- Fairly light   Aerobic Endurance Distance Walked 870   Endurance additional comments Pt required 2 seated rest breaks due to SOB. One at 2:15, Vitals were 93% O2, 87 bpm, lasting about 45 sec. Seconds was at 3:58 with vitals at 90%O2, 87 bpm, lasting about 1 minute. Pt resumed walking after each break. Pt reported mild chest tightness following 6 minute walk that resolved in less than 5 minutes with rest.                         PT Education - Nov 03, 2014 1006    Education provided Yes   Education Details Acitivity modification with SOB/chest tightness   Person(s) Educated Patient   Methods Explanation   Comprehension Verbalized understanding                    Plan - 2014-11-03 1006    Clinical Impression Statement Pt is an 81 yo female presenting to OPPT for evaluation prior to possible TAVR surgery for severe aortic stenosis. Pt presents with ROM/Strength WNL, fair to good balance - only at increased fall risk per Four Stage Balance Test, and moderate to severe limitations with gait due to SOB.    PT Frequency One time visit   Consulted and Agree with Plan of Care Patient          G-Codes - 03-Nov-2014 1010    Functional Assessment Tool Used 6 minute walk test with 2  seated rest breaks required due to SOB - 870 feet   Functional Limitation Mobility: Walking and moving around   Mobility: Walking and Moving Around Current Status (W0981) At least 40 percent but less than 60 percent  impaired, limited or restricted   Mobility: Walking and Moving Around Goal Status [phone]) At least 40 percent but less than 60 percent impaired, limited or restricted   Mobility: Walking and Moving Around Discharge Status [phone]) At least 40 percent but less than 60 percent impaired, limited or restricted       Problem List Patient Active Problem List   Diagnosis Date Noted  . Occlusion and stenosis of carotid artery without mention of cerebral infarction 01/26/2012  . URI (upper respiratory infection) 04/06/2011  . AODM 08/05/2008  . CORONARY ATHEROSCLEROSIS NATIVE CORONARY ARTERY 08/05/2008  . PERIPHERAL VASCULAR DISEASE WITH CLAUDICATION 08/05/2008  . HYPERLIPIDEMIA-MIXED 08/02/2008  . HYPERTENSION, BENIGN 08/02/2008  . Aortic valve disorder 08/02/2008  . CVA 08/02/2008  . Peripheral vascular disease, unspecified 08/02/2008    NICOLETTA,DANA, PT 11/01/2014, 10:15 AM  [facility] [address] [place], [place], [address] Phone: [phone]   Fax:  [phone]

## 2014-11-04 ENCOUNTER — Encounter: Payer: Self-pay | Admitting: Surgery

## 2014-11-04 ENCOUNTER — Other Ambulatory Visit: Payer: Self-pay | Admitting: *Deleted

## 2014-11-04 DIAGNOSIS — I350 Nonrheumatic aortic (valve) stenosis: Secondary | ICD-10-CM

## 2014-11-04 NOTE — Progress Notes (Signed)
HPI:  The patient returns today to discuss the results of her CT scans as part of her TAVR workup. She is here with her daughter [name] who is a Engineer, civil (consulting) in the PACU. Her symptoms of shortness of breath, fatigue and chest discomfort with mild exertion are stable.  Current Outpatient Prescriptions  Medication Sig Dispense Refill  . aspirin 81 MG tablet Take 81 mg by mouth daily.      Marland Kitchen atorvastatin (LIPITOR) 80 MG tablet TAKE 1 TABLET (80 MG TOTAL) BY MOUTH DAILY. 90 tablet 2  . glipiZIDE (GLUCOTROL) 5 MG tablet Take 5 mg by mouth daily.      Marland Kitchen ibuprofen (ADVIL,MOTRIN) 200 MG tablet Take 200 mg by mouth every 6 (six) hours as needed for mild pain or moderate pain.    Marland Kitchen lisinopril (PRINIVIL,ZESTRIL) 40 MG tablet Take 1 tablet (40 mg total) by mouth daily. 90 tablet 2  . metoprolol tartrate (LOPRESSOR) 25 MG tablet Take 1 tablet (25 mg total) by mouth 2 (two) times daily. 180 tablet 1   No current facility-administered medications for this visit.     Physical Exam: BP 129/55 mmHg  Pulse 60  Resp 16  Ht 5' (1.524 m)  Wt 109 lb (49.442 kg)  BMI 21.29 kg/m2  SpO2 98% She looks tired but in no distress Cardiac exam shows a regular rate and rhythm with a grade III/VI crescendo/decrescendo murmur heard best at RUSB, no diastolic murmur Lungs are clear  Diagnostic Tests:  ADDENDUM REPORT: 10/24/2014 15:04  CLINICAL DATA: 81 year old female with severe aortic stenosis.  EXAM: Cardiac TAVR CT  TECHNIQUE: The patient was scanned on a Philips 256 scanner. A 120 kV retrospective scan was triggered in the descending thoracic aorta at 111 HU's. Gantry rotation speed was 270 msecs and collimation was .9 mm. No beta blockade or nitro were given. The 3D data set was reconstructed in 5% intervals of the R-R cycle. Systolic and diastolic phases were analyzed on a dedicated work station using MPR, MIP and VRT modes. The patient received 80 cc of contrast.  FINDINGS: Aortic  Valve: Moderately thickened aortic valve leaflets. Non-coronary cusp is severely calcified and fixed with minimal motion. Left leaflet has minimal calcification, right none, both have moderately restricted motion. There are no subvalvular calcifications.  Aorta: Normal caliber, no dissection. Moderate diffuse calcifications predominantly in the aortic arch and descending thoracic aorta. There is significant atherosclerotic plaque in the descending aorta.  Sinotubular Junction: 24 x 22 mm  Ascending Thoracic Aorta: 28 x 27 mm  Descending Thoracic Aorta: 16 x 15 mm  Sinus of Valsalva Measurements:  Non-coronary: 25 mm  Right -coronary: 25 mm  Left -coronary: 26 mm  Coronary Artery Height above Annulus:  Left Main: 9 mm  Right Coronary: 11 mm  Virtual Basal Annulus Measurements:  Maximum/Minimum Diameter: 24 x 20 mm  Perimeter: 83 mm  Area: 370 mm2  Optimum Fluoroscopic Angle for Delivery: LAO 5 CAU 5  Other findings: Dilated pulmonary artery consistent with pulmonary hypertension.  Moderately dilated left atrium.  IMPRESSION: 1. Moderately thickened aortic valve with severely calcified and fixed non-coronary cusp and annular measurements suitable for delivery of 23 mm Edward-SAPIEN 3 TAVR valve.  2. Sufficient annulus to coronary distance.  3. Optimal fluoroscopic angle for delivery is LAO 5 CAU 5.  Tobias Alexander   Electronically Signed  By: Tobias Alexander  On: 10/24/2014 15:04      Study Result     EXAM: OVER-READ  INTERPRETATION CT CHEST  The following report is an over-read performed by radiologist Dr. [facility] on 10/23/2014. This over-read does not include interpretation of cardiac or coronary anatomy or pathology. The coronary calcium score/coronary CTA interpretation by the cardiologist is attached.  COMPARISON: Contemporaneous CTA of the chest, abdomen and  pelvis. No priors.  FINDINGS: Please refer to contemporaneous CTA of the chest, abdomen and pelvis for full description of extracardiac findings.  IMPRESSION: Extracardiac findings will be described on the contemporaneous CTA of the chest, abdomen and pelvis. Please see that dictation for full description.  Electronically Signed: By: Trudie Reed M.D. On: 10/23/2014 10:00      Result History     CT Coronary Morp W/Cta Cor W/Score W/Ca W/Cm &/Or Wo/Cm (Order #[id number]) on 10/24/2014 - Order Result History Report          Vitals     Height Weight BMI (Calculated)    5' (1.524 m) 109 lb (49.442 kg) 21.3      Interpretation Summary     EXAM: OVER-READ INTERPRETATION CT CHEST  The following report is an over-read performed by radiologist Dr. [facility] on 10/23/2014. This over-read does not include interpretation of cardiac or coronary anatomy or pathology. The coronary calcium score/coronary CTA interpretation by the cardiologist is attached.  COMPARISON: Contemporaneous CTA of the chest, abdomen and pelvis. No priors.  FINDINGS: Please refer to contemporaneous CTA of the chest, abdomen and pelvis for full description of extracardiac findings.  IMPRESSION: Extracardiac findings will be described on the contemporaneous CTA of the chest, abdomen and pelvis. Please see that dictation for full description.  Electronically Signed: By: Trudie Reed M.D. On: 10/23/2014 10:00     Addendum by Provider Default, MD on Thu Oct 24, 2014 3:07 PM    ADDENDUM REPORT: 10/24/2014 15:04  CLINICAL DATA: 81 year old female with severe aortic stenosis.  EXAM: Cardiac TAVR CT  TECHNIQUE: The patient was scanned on a Philips 256 scanner. A 120 kV retrospective scan was triggered in the descending thoracic aorta at 111 HU's. Gantry rotation speed was 270 msecs and collimation was .9 mm. No beta blockade or  nitro were given. The 3D data set was reconstructed in 5% intervals of the R-R cycle. Systolic and diastolic phases were analyzed on a dedicated work station using MPR, MIP and VRT modes. The patient received 80 cc of contrast.  FINDINGS: Aortic Valve: Moderately thickened aortic valve leaflets. Non-coronary cusp is severely calcified and fixed with minimal motion. Left leaflet has minimal calcification, right none, both have moderately restricted motion. There are no subvalvular calcifications.  Aorta: Normal caliber, no dissection. Moderate diffuse calcifications predominantly in the aortic arch and descending thoracic aorta. There is significant atherosclerotic plaque in the descending aorta.  Sinotubular Junction: 24 x 22 mm  Ascending Thoracic Aorta: 28 x 27 mm  Descending Thoracic Aorta: 16 x 15 mm  Sinus of Valsalva Measurements:  Non-coronary: 25 mm  Right -coronary: 25 mm  Left -coronary: 26 mm  Coronary Artery Height above Annulus:  Left Main: 9 mm  Right Coronary: 11 mm  Virtual Basal Annulus Measurements:  Maximum/Minimum Diameter: 24 x 20 mm  Perimeter: 83 mm  Area: 370 mm2  Optimum Fluoroscopic Angle for Delivery: LAO 5 CAU 5  Other findings: Dilated pulmonary artery consistent with pulmonary hypertension.  Moderately dilated left atrium.  IMPRESSION: 1. Moderately thickened aortic valve with severely calcified and fixed non-coronary cusp and annular measurements suitable for delivery of  23 mm Edward-SAPIEN 3 TAVR valve.  2. Sufficient annulus to coronary distance.  3. Optimal fluoroscopic angle for delivery is LAO 5 CAU 5.  Tobias Alexander   Electronically Signed  By: Tobias Alexander  On: 10/24/2014 15:04    CLINICAL DATA: 81 year old female with history of severe aortic stenosis. Preprocedural study prior to potential transcatheter aortic valve replacement (TAVR).  EXAM: CT  ANGIOGRAPHY CHEST, ABDOMEN AND PELVIS  TECHNIQUE: Multidetector CT imaging through the chest, abdomen and pelvis was performed using the standard protocol during bolus administration of intravenous contrast. Multiplanar reconstructed images and MIPs were obtained and reviewed to evaluate the vascular anatomy.  CONTRAST: 80mL OMNIPAQUE IOHEXOL 350 MG/ML SOLN  COMPARISON: No priors.  FINDINGS: CTA CHEST FINDINGS  Mediastinum/Lymph Nodes: Heart size is mildly enlarged with left atrial dilatation. There is no significant pericardial fluid, thickening or pericardial calcification. The apex of the left ventricle is immediately deep to the anterior aspect of the the left sixth/seventh intercostal space. Severely thickened and heavily calcified aortic valve (particularly the non coronary cusp). There is atherosclerosis of the thoracic aorta, the great vessels of the mediastinum and the coronary arteries, including calcified atherosclerotic plaque in the left anterior descending and left circumflex coronary arteries. No pathologically enlarged mediastinal or hilar lymph nodes. Small hiatal hernia. No axillary lymphadenopathy.  Lungs/Pleura: Calcified granuloma in the right upper lobe with adjacent area of architectural distortion which presumably reflects some post infectious/ inflammatory changes. Nodular bilateral apical pleural parenchymal thickening, favored to reflect chronic post infectious or inflammatory scarring. No other suspicious appearing pulmonary nodules or masses are noted. No acute consolidative airspace disease. No pleural effusions.  Musculoskeletal/Soft Tissues: Multiple vertebral body compression fractures, chronic appearing T10 compression fracture with approximately 50% loss of anterior vertebral body height. Mild compression of the superior endplate of T11 as well, with approximately 20% loss of central vertebral body height. There are no aggressive  appearing lytic or blastic lesions noted in the visualized portions of the skeleton.  CTA ABDOMEN AND PELVIS FINDINGS  Hepatobiliary: No cystic or solid hepatic lesions. No intra or extrahepatic biliary ductal dilatation. Gallbladder is normal in appearance.  Pancreas: No pancreatic mass. No pancreatic ductal dilatation. No pancreatic or peripancreatic fluid or inflammatory changes.  Spleen: Unremarkable.  Adrenals/Urinary Tract: Bilateral kidneys and bilateral adrenal glands are normal in appearance. Right-sided extra renal pelvis (normal anatomical variant). No hydroureteronephrosis or perinephric stranding to indicate urinary tract obstruction at this time. Urinary bladder is normal in appearance.  Stomach/Bowel: Normal appearance of the stomach. No pathologic dilatation of small bowel or colon. Numerous colonic diverticulae are noted, without surrounding inflammatory changes to suggest an acute diverticulitis at this time.  Vascular/Lymphatic: Vascular findings and measurements pertinent to potential TAVR procedure, as detailed below. Single renal arteries bilaterally. Celiac axis, superior mesenteric artery and inferior mesenteric artery are all patent. Extensive atheromatous plaque throughout the abdominal aorta.  Reproductive: Uterus and ovaries are atrophic.  Other: No significant volume of ascites. No pneumoperitoneum.  Musculoskeletal: Old vertebral body compression fracture of L1 with approximately 25% loss of anterior vertebral body height. There are no aggressive appearing lytic or blastic lesions noted in the visualized portions of the skeleton.  VASCULAR MEASUREMENTS PERTINENT TO TAVR:  AORTA:  Minimal Aortic Diameter - 8 x 6 mm  Severity of Aortic Calcification - moderate to severe  RIGHT PELVIS:  Right Common Iliac Artery -  Minimal Diameter - 4.8 x 4.4 mm  Tortuosity - mild to moderate  Calcification - moderate to  severe  Right External Iliac Artery -  Minimal Diameter - 5.4 x 4.1 mm  Tortuosity - mild  Calcification - mild  Right Common Femoral Artery -  Minimal Diameter - 4.5 x 3.9 mm  Tortuosity - mild  Calcification - mild to moderate  LEFT PELVIS:  Left Common Iliac Artery -  Minimal Diameter - 7.9 x 4.9 mm  Tortuosity - moderate to severe  Calcification - moderate to severe  Left External Iliac Artery -  Minimal Diameter - 5.4 x 5.6 mm  Tortuosity - mild  Calcification - mild  Left Common Femoral Artery -  Minimal Diameter - 4.9 x 4.6 mm  Tortuosity - mild  Calcification - mild to moderate  Review of the MIP images confirms the above findings.  IMPRESSION: 1. Vascular findings and measurements pertinent to potential TAVR procedure, as detailed above. This patient does not appear to have suitable pelvic arterial access based predominantly on the small size of her pelvic vessels. 2. The apex of the left ventricle is immediately deep to the anterior aspect of the the left sixth/seventh intercostal space. 3. Bilateral apical nodular pleuroparenchymal thickening, strongly favored to represent areas of chronic post infectious or inflammatory scarring. Unfortunately, we have no prior studies available for comparison. Repeat noncontrast chest CT is recommended in 6 months to ensure the stability of these findings. 4. Colonic diverticulosis without findings to suggest acute diverticulitis at this time. 5. Additional incidental findings, as above.   Electronically Signed  By: Trudie Reed M.D.  On: 10/23/2014 11:41    Ref Range 12d ago    FVC-Pre L 1.67   FVC-%Pred-Pre % 97   FVC-Post L 1.75   FVC-%Pred-Post % 102   FVC-%Change-Post % 4   FEV1-Pre L 1.15   FEV1-%Pred-Pre % 93   FEV1-Post L 1.29   FEV1-%Pred-Post % 104   FEV1-%Change-Post % 12   FEV6-Pre L 1.63   FEV6-%Pred-Pre % 103   FEV6-Post L 1.72    FEV6-%Pred-Post % 108   FEV6-%Change-Post % 5   Pre FEV1/FVC ratio % 69   FEV1FVC-%Pred-Pre % 95   Post FEV1/FVC ratio % 74   FEV1FVC-%Change-Post % 7   Pre FEV6/FVC Ratio % 97   FEV6FVC-%Pred-Pre % 105   Post FEV6/FVC ratio % 98   FEV6FVC-%Pred-Post % 107   FEV6FVC-%Change-Post % 1   FEF 25-75 Pre L/sec 0.66   FEF2575-%Pred-Pre % 86   FEF 25-75 Post L/sec 1.11   FEF2575-%Pred-Post % 146   FEF2575-%Change-Post % 69   RV L 2.01   RV % pred % 88   TLC L 3.77   TLC % pred % 87   DLCO unc ml/min/mmHg 9.03   DLCO unc % pred % 51   DL/VA ml/min/mmHg/L 2.29   DL/VA % pred % 83   Resulting Agency [facility]       Specimen Collected: 10/23/14 11:20 AM Last Resulted: 10/23/14 12:09 PM                 Results        Scan on 10/23/2014 9:33 PM by Waymon Budge, MDScan on 10/23/2014 9:33 PM by Waymon Budge, MD                                                     Physical Therapy Evaluation  Patient  Details  Name: Kara Santiago MRN: [id number] Date of Birth: [birth year] Referring Provider: Alleen Borne, MD  Encounter Date: 11/01/2014      PT End of Session - 11/01/14 0932    Visit Number 1   Number of Visits 1   PT Start Time 0927   PT Stop Time 1003   PT Time Calculation (min) 36 min      Past Medical History  Diagnosis Date  . Aortic stenosis   . CAD (coronary artery disease)   . CVD (cerebrovascular disease)   . Hyperlipidemia   . Hypertension   . PVD (peripheral vascular disease)   . Mitral regurgitation   . Aortic insufficiency   . Carotid artery occlusion   . Diabetes mellitus type 2, controlled   . History of echocardiogram     Echo 5/16: Mild diffuse HK more pronounced in the inferior and inferolateral walls, mild focal basal septal hypertrophy, EF 40%, grade 2 diastolic dysfunction, severe aortic stenosis (mean 34 mmHg, peak 57 mmHg,  moderate MR, mild LAE, normal RV function, mild TR, trivial PI, PASP 41 mmHg, trivial effusion    Past Surgical History  Procedure Laterality Date  . Tympanomastoidectomy  2002    right  . Cardiac catheterization  04/07/07    By Dr. Excell Seltzer -- 1. Severe LAD stenosis with successful PCI using a drug-eluting stent. 2. Severe left circumflex stenosis with successful PCI using a single drug-eluting stent.  . Stents  2008  . Cardiac catheterization N/A 10/07/2014    Procedure: Right/Left Heart Cath and Coronary Angiography; Surgeon: Tonny Bollman, MD; Location: [facility] INVASIVE CV LAB; Service: Cardiovascular; Laterality: N/A;    There were no vitals filed for this visit.  Visit Diagnosis: Difficulty walking - Plan: PT plan of care cert/re-cert  Severe aortic stenosis - Plan: PT plan of care cert/re-cert      Subjective Assessment - 11/01/14 0931    Subjective reports increased SOB with activity about 2-3 weeks ago, mainly notices it with community ambulation especially on inclines   Patient Stated Goals improve mobility   Currently in Pain? No/denies            [facility] PT Assessment - 11/01/14 0001    Assessment   Medical Diagnosis severe aortic stenosis   Onset Date/Surgical Date 10/11/14  approximately    Precautions   Precautions None   Restrictions   Weight Bearing Restrictions No   Balance Screen   Has the patient fallen in the past 6 months No   Has the patient had a decrease in activity level because of a fear of falling?  No   Is the patient reluctant to leave their home because of a fear of falling?  No   Home Environment   Living Environment Private residence   Home Access Stairs to enter   Entrance Stairs-Number of Steps 2   Entrance Stairs-Rails None   Home Layout Multi-level  resides on main level   Additional Comments Pt currently staying with daughter until  surgery. Pt plans to return home to two level home with laundry and kitchen in basement.   Prior Function   Level of Independence Independent with basic ADLs;Independent with gait   Cognition   Overall Cognitive Status Within Functional Limits for tasks assessed   ROM / Strength   AROM / PROM / Strength AROM;Strength   AROM   Overall AROM  Within functional limits for tasks performed   Strength   Overall Strength Comments grossly 4-5/5  throughout   Strength Assessment Site Hand   Right/Left hand Right;Left   Right Hand Grip (lbs) 35   Left Hand Grip (lbs) 39          [facility] Pre-Surgical Assessment - 11-27-2014 0001    5 Meter Walk Test- trial 1 4.1 sec   5 Meter Walk Test- trial 2 4 sec.   5 Meter Walk Test- trial 3 4.1 sec.  </= 6 sec WNL   5 meter walk test average 4.07 sec   Timed Up & Go Test trial  10.2 sec.   Comments </= 12 seconds WNL   4 Stage Balance Test tolerated for:  4 sec.   4 Stage Balance Test Position 3   comment inability to hold x 10 seconds in position 3 is indicative of increased fall risk   Sit To Stand Test- trial 1 11.9 sec.   Comment </= 14.8 WNL   ADL/IADL Independent with: Bathing;Dressing;Meal prep;Finances   ADL/IADL Needs Assistance with: Pincus Badder work  as of last 2-3 weeks   ADL/IADL Fraility Index Vulnerable   6 Minute Walk- Baseline yes   BP (mmHg) 150/80 mmHg   HR (bpm) 60   02 Sat (%RA) 99 %   Modified Borg Scale for Dyspnea 0- Nothing at all   Perceived Rate of Exertion (Borg) 6-   6 Minute Walk Post Test yes   BP (mmHg) 148/72 mmHg   HR (bpm) 65   02 Sat (%RA) 93 %   Modified Borg Scale for Dyspnea 5- Strong or hard breathing   Perceived Rate of Exertion (Borg) 11- Fairly light   Aerobic Endurance Distance Walked 870   Endurance additional comments Pt required 2 seated rest breaks due to SOB. One at 2:15, Vitals  were 93% O2, 87 bpm, lasting about 45 sec. Seconds was at 3:58 with vitals at 90%O2, 87 bpm, lasting about 1 minute. Pt resumed walking after each break. Pt reported mild chest tightness following 6 minute walk that resolved in less than 5 minutes with rest.                         PT Education - Nov 27, 2014 1006    Education provided Yes   Education Details Acitivity modification with SOB/chest tightness   Person(s) Educated Patient   Methods Explanation   Comprehension Verbalized understanding                    Plan - 27-Nov-2014 1006    Clinical Impression Statement Pt is an 81 yo female presenting to OPPT for evaluation prior to possible TAVR surgery for severe aortic stenosis. Pt presents with ROM/Strength WNL, fair to good balance - only at increased fall risk per Four Stage Balance Test, and moderate to severe limitations with gait due to SOB.    PT Frequency One time visit   Consulted and Agree with Plan of Care Patient          G-Codes - 11/27/2014 1010    Functional Assessment Tool Used 6 minute walk test with 2 seated rest breaks required due to SOB - 870 feet   Functional Limitation Mobility: Walking and moving around   Mobility: Walking and Moving Around Current Status (Z6109) At least 40 percent but less than 60 percent impaired, limited or restricted   Mobility: Walking and Moving Around Goal Status (U0454) At least 40 percent but less than 60 percent impaired, limited or restricted   Mobility: Walking and  Moving Around Discharge Status [phone]) At least 40 percent but less than 60 percent impaired, limited or restricted       Problem List Patient Active Problem List   Diagnosis Date Noted  . Occlusion and stenosis of carotid artery without mention of cerebral infarction 01/26/2012  . URI (upper respiratory infection) 04/06/2011  . AODM 08/05/2008  . CORONARY  ATHEROSCLEROSIS NATIVE CORONARY ARTERY 08/05/2008  . PERIPHERAL VASCULAR DISEASE WITH CLAUDICATION 08/05/2008  . HYPERLIPIDEMIA-MIXED 08/02/2008  . HYPERTENSION, BENIGN 08/02/2008  . Aortic valve disorder 08/02/2008  . CVA 08/02/2008  . Peripheral vascular disease, unspecified 08/02/2008    NICOLETTA,DANA, PT 11/01/2014, 10:15 AM  [facility] [address] [place], [place], [address] Phone: [phone] Fax: [phone]          Impression:  This patient has stage D severe symptomatic aortic stenosis with NYHA class III symptoms of dyspnea and fatigue with minimal exertion. I have personally reviewed her CT scans. She is a candidate for a 23 mm Sapien 3 valve.  Her pelvic vessels are small and have mild calcific plaque segmentally but I think they are large enough for a 14 F sheath. Her ascending aorta has some focal calcium on the left anterolateral surface as well as in the root but I think transaortic access would be possible if her pelvic vessels are too small. Her PFT's show minimal obstructive defect and a moderate diffusion defect. Her PT evaluation showed that she could walk for 6 minutes but had to stop twice due to shortness of breath and chest discomfort. I think aortic valve replacement is indicated in this patient. She would be a very high risk candidate for conventional open surgical AVR due to her age, calcified ascending aorta that may require aortic replacement, and moderate MR that may require repair at the same time. I think TAVR would be the best option for treating her aortic stenosis.  I discussed what types of management strategies would be attempted intraoperatively in the event of life-threatening complications, including whether or not the patient would be considered a candidate for the use of cardiopulmonary bypass and/or conversion to open sternotomy for attempted surgical intervention.  The  patient has been advised of a variety of complications that might develop including but not limited to risks of death, stroke, paravalvular leak, aortic dissection or other major vascular complications, aortic annulus rupture, device embolization, cardiac rupture or perforation, mitral regurgitation, acute myocardial infarction, arrhythmia, heart block or bradycardia requiring permanent pacemaker placement, congestive heart failure, respiratory failure, renal failure, pneumonia, infection, other late complications related to structural valve deterioration or migration, or other complications that might ultimately cause a temporary or permanent loss of functional independence or other long term morbidity.  The patient provides full informed consent for the procedure as described and all questions were answered.   Plan:  She will return to see Dr. Cornelius Moras for a second surgical opinion and will tentatively be scheduled for transfemoral TAVR on 11/26/2014 pending his evaluation.   Alleen Borne, MD [facility] [phone]

## 2014-11-21 ENCOUNTER — Encounter: Payer: Self-pay | Admitting: Thoracic Surgery (Cardiothoracic Vascular Surgery)

## 2014-11-21 ENCOUNTER — Encounter (HOSPITAL_COMMUNITY): Payer: Self-pay

## 2014-11-21 ENCOUNTER — Encounter (HOSPITAL_COMMUNITY)
Admission: RE | Admit: 2014-11-21 | Discharge: 2014-11-21 | Disposition: A | Discharge status: Home / Self Care | Payer: Medicare Other | Source: Ambulatory Visit | Attending: Cardiovascular Disease | Admitting: Cardiovascular Disease

## 2014-11-21 ENCOUNTER — Institutional Professional Consult (permissible substitution) (INDEPENDENT_AMBULATORY_CARE_PROVIDER_SITE_OTHER): Payer: Medicare Other | Admitting: Thoracic Surgery (Cardiothoracic Vascular Surgery)

## 2014-11-21 ENCOUNTER — Ambulatory Visit (HOSPITAL_COMMUNITY)
Admission: RE | Admit: 2014-11-21 | Discharge: 2014-11-21 | Disposition: A | Discharge status: Home / Self Care | Payer: Medicare Other | Source: Ambulatory Visit | Attending: Cardiovascular Disease | Admitting: Cardiovascular Disease

## 2014-11-21 VITALS — BP 140/64 | HR 55 | Temp 98.0°F | Resp 20 | Ht 60.0 in | Wt 107.8 lb

## 2014-11-21 VITALS — BP 147/59 | HR 51 | Resp 20 | Ht 60.0 in | Wt 109.0 lb

## 2014-11-21 DIAGNOSIS — I350 Nonrheumatic aortic (valve) stenosis: Secondary | ICD-10-CM

## 2014-11-21 DIAGNOSIS — Z01812 Encounter for preprocedural laboratory examination: Secondary | ICD-10-CM | POA: Insufficient documentation

## 2014-11-21 DIAGNOSIS — Z01818 Encounter for other preprocedural examination: Secondary | ICD-10-CM | POA: Diagnosis not present

## 2014-11-21 DIAGNOSIS — I25119 Atherosclerotic heart disease of native coronary artery with unspecified angina pectoris: Secondary | ICD-10-CM | POA: Diagnosis not present

## 2014-11-21 DIAGNOSIS — I6523 Occlusion and stenosis of bilateral carotid arteries: Secondary | ICD-10-CM | POA: Diagnosis not present

## 2014-11-21 HISTORY — DX: Gastro-esophageal reflux disease without esophagitis: K21.9

## 2014-11-21 HISTORY — DX: Personal history of other diseases of the digestive system: Z87.19

## 2014-11-21 HISTORY — DX: Reserved for inherently not codable concepts without codable children: IMO0001

## 2014-11-21 HISTORY — DX: Unspecified osteoarthritis, unspecified site: M19.90

## 2014-11-21 LAB — COMPREHENSIVE METABOLIC PANEL
ALT: 14 U/L (ref 14–54)
ANION GAP: 9 (ref 5–15)
AST: 22 U/L (ref 15–41)
Albumin: 3.9 g/dL (ref 3.5–5.0)
Alkaline Phosphatase: 84 U/L (ref 38–126)
BUN: 16 mg/dL (ref 6–20)
CO2: 22 mmol/L (ref 22–32)
CREATININE: 0.81 mg/dL (ref 0.44–1.00)
Calcium: 9.4 mg/dL (ref 8.9–10.3)
Chloride: 108 mmol/L (ref 101–111)
Glucose, Bld: 96 mg/dL (ref 65–99)
POTASSIUM: 4.5 mmol/L (ref 3.5–5.1)
SODIUM: 139 mmol/L (ref 135–145)
TOTAL PROTEIN: 7.6 g/dL (ref 6.5–8.1)
Total Bilirubin: 0.9 mg/dL (ref 0.3–1.2)

## 2014-11-21 LAB — CBC
HCT: 40.9 % (ref 36.0–46.0)
Hemoglobin: 13.8 g/dL (ref 12.0–15.0)
MCH: 31.6 pg (ref 26.0–34.0)
MCHC: 33.7 g/dL (ref 30.0–36.0)
MCV: 93.6 fL (ref 78.0–100.0)
PLATELETS: 257 10*3/uL (ref 150–400)
RBC: 4.37 MIL/uL (ref 3.87–5.11)
RDW: 14.7 % (ref 11.5–15.5)
WBC: 6.1 10*3/uL (ref 4.0–10.5)

## 2014-11-21 LAB — BLOOD GAS, ARTERIAL
Acid-base deficit: 1.9 mmol/L (ref 0.0–2.0)
BICARBONATE: 21.9 meq/L (ref 20.0–24.0)
DRAWN BY: 428831
FIO2: 0.21 %
O2 SAT: 98.5 %
PO2 ART: 108 mmHg — AB (ref 80.0–100.0)
Patient temperature: 98.6
TCO2: 22.9 mmol/L (ref 0–100)
pCO2 arterial: 33.8 mmHg — ABNORMAL LOW (ref 35.0–45.0)
pH, Arterial: 7.426 (ref 7.350–7.450)

## 2014-11-21 LAB — PROTIME-INR
INR: 1.07 (ref 0.00–1.49)
PROTHROMBIN TIME: 14.1 s (ref 11.6–15.2)

## 2014-11-21 LAB — URINE MICROSCOPIC-ADD ON

## 2014-11-21 LAB — URINALYSIS, ROUTINE W REFLEX MICROSCOPIC
BILIRUBIN URINE: NEGATIVE
Glucose, UA: NEGATIVE mg/dL
Ketones, ur: NEGATIVE mg/dL
Leukocytes, UA: NEGATIVE
Nitrite: NEGATIVE
PROTEIN: NEGATIVE mg/dL
SPECIFIC GRAVITY, URINE: 1.005 (ref 1.005–1.030)
UROBILINOGEN UA: 0.2 mg/dL (ref 0.0–1.0)
pH: 6 (ref 5.0–8.0)

## 2014-11-21 LAB — ABO/RH: ABO/RH(D): O POS

## 2014-11-21 LAB — APTT: aPTT: 36 seconds (ref 24–37)

## 2014-11-21 LAB — SURGICAL PCR SCREEN
MRSA, PCR: NEGATIVE
STAPHYLOCOCCUS AUREUS: NEGATIVE

## 2014-11-21 LAB — GLUCOSE, CAPILLARY: Glucose-Capillary: 95 mg/dL (ref 65–99)

## 2014-11-21 NOTE — Progress Notes (Signed)
[facility]  MULTIDISCIPLINARY HEART VALVE CLINIC    CARDIOTHORACIC SURGERY CONSULTATION REPORT  Referring Provider is Tonny Bollman, MD PCP is Stefanie Libel, MD  Chief Complaint  Patient presents with  . Aortic Stenosis    further discuss TAVR scheduled for 11/26/2014    HPI:  Patient is an 81 year old widowed white female with history of aortic stenosis, mitral regurgitation, coronary artery disease status post multivessel PCI and stenting in 2008, cerebrovascular disease, hypertension, peripheral vascular disease, and type 2 diabetes mellitus with been referred for a second surgical opinion to discuss treatment options for management of severe symptomatic aortic stenosis.  The patient's cardiac history dates back to 2008 when she presented with symptoms of angina pectoralis. She was found to have severe two-vessel coronary artery disease and was treated using PCI and stenting with drug-eluting stents in both the left anterior descended coronary artery and the left circumflex coronary artery. She was noted to have mild to moderate aortic stenosis at the time and has been followed regularly ever since with serial echocardiograms. Over the past 2 months the patient has developed progressive symptoms of exertional shortness of breath.  Follow-up transthoracic echocardiogram revealed the presence of severe aortic stenosis.  Peak velocity across aortic valve measured 3.6 m/s corresponding to a mean transvalvular gradient of 32 mmHg. However, left ventricular systolic function was moderately reduced with ejection fraction 40%. The dimension was ratio between the left ventricular outflow tract and the aortic valve was very low, measuring 0.21. The patient was also noted to have moderate mitral regurgitation. She underwent left and right heart catheterization by Dr. Excell Seltzer demonstrating continued patency in both previous stents and no other significant flow limiting coronary artery disease.   By catheterization the mean transvalvular gradient measured 23 mmHg with peak to peak gradient 35 mmHg. The aortic valve area was calculated at 0.64 cm. Pulmonary artery pressures were mildly elevated. The patient was referred for elective surgical consultation and has been seen previously by Dr. Laneta Simmers.  He noted that the patient had severe calcification involving the aortic root and proximal ascending thoracic aorta, and he felt that transcatheter aortic valve replacement might be a preferable option to high-risk conventional surgery. The patient subsequently underwent CT angiography which suggests that she appears to be a reasonably good candidate for transcatheter aortic valve replacement using a 23 mm Edwards Sapien 3 transcatheter heart valve. She appears to have borderline adequate pelvic vascular access for transfemoral approach. The patient has been referred for second surgical opinion to discuss treatment options further.  The patient is widowed and lives in [place] by herself. However, recently she has been staying with her daughter who works at [employer]. She has been very active physically of her life until quite recently. She has remained functional independent without any significant physical limitations. She describes a recent progression of symptoms of exertional shortness of breath and fatigue consistent with chronic combined systolic and diastolic congestive heart failure, New [place] Heart Association functional class IIB-III.  She has recently been experiencing shortness of breath with mild exertion. She denies any history of resting shortness of breath, PND, orthopnea, palpitations, dizzy spells, or syncope. She has not been experiencing any type of chest discomfort.  Past Medical History  Diagnosis Date  . Aortic stenosis   . CAD (coronary artery disease)   . CVD (cerebrovascular disease)   . Hyperlipidemia   . Hypertension   . PVD (peripheral vascular disease)   . Mitral  regurgitation   . Aortic  insufficiency   . Carotid artery occlusion   . Diabetes mellitus type 2, controlled   . History of echocardiogram     Echo 5/16: Mild diffuse HK more pronounced in the inferior and inferolateral walls, mild focal basal septal hypertrophy, EF 40%, grade 2 diastolic dysfunction, severe aortic stenosis (mean 34 mmHg, peak 57 mmHg, moderate MR, mild LAE, normal RV function, mild TR, trivial PI, PASP 41 mmHg, trivial effusion  . Heart murmur   . Shortness of breath dyspnea   . GERD (gastroesophageal reflux disease)   . History of hiatal hernia   . Arthritis     told that she has OA- pt. reports that she doesn't have any pain from it but she reports that she has back pain from time to time     Past Surgical History  Procedure Laterality Date  . Tympanomastoidectomy  2002    right  . Stents  2008  . Abdominal hysterectomy      vaginal approach, partial   . Cardiac catheterization  04/07/07    By Dr. Excell Seltzer -- 1. Severe LAD stenosis with successful PCI using a drug-eluting stent. 2. Severe left circumflex stenosis with successful PCI using a single  drug-eluting stent.  . Cardiac catheterization N/A 10/07/2014    Procedure: Right/Left Heart Cath and Coronary Angiography;  Surgeon: Tonny Bollman, MD;  Location: [facility] INVASIVE CV LAB;  Service: Cardiovascular;  Laterality: N/A;  . Eye surgery      bilateral cataracts removed & /w IOL    Family History  Problem Relation Age of Onset  . Pneumonia    . Heart disease Mother   . Heart disease Sister   . Heart disease Brother   . Heart disease Son   . Heart attack Mother   . Heart attack Sister   . Heart attack Son   . Stroke Maternal Uncle   . Heart attack Brother     History   Social History  . Marital Status: Widowed    Spouse Name: N/A  . Number of Children: N/A  . Years of Education: N/A   Occupational History  . 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 Smoking status:  Former Smoker -- 0.40 packs/day for 5 years    Types: Cigarettes    Quit date: 05/24/2006  . Smokeless tobacco: Never Used  . Alcohol Use: Yes     Comment: wine- Glass/ per day   . Drug Use: No  . Sexual Activity: Not on file   Other Topics Concern  . Not on file   Social History Narrative    Current Outpatient Prescriptions  Medication Sig Dispense Refill  . aspirin 81 MG tablet Take 81 mg by mouth daily.      Marland Kitchen atorvastatin (LIPITOR) 80 MG tablet TAKE 1 TABLET (80 MG TOTAL) BY MOUTH DAILY. 90 tablet 2  . glipiZIDE (GLUCOTROL) 5 MG tablet Take 5 mg by mouth daily.      Marland Kitchen ibuprofen (ADVIL,MOTRIN) 200 MG tablet Take 200 mg by mouth every 6 (six) hours as needed for mild pain or moderate pain.    Marland Kitchen lisinopril (PRINIVIL,ZESTRIL) 40 MG tablet Take 1 tablet (40 mg total) by mouth daily. 90 tablet 2  . metoprolol tartrate (LOPRESSOR) 25 MG tablet Take 1 tablet (25 mg total) by mouth 2 (two) times daily. 180 tablet 1  . ranitidine (ZANTAC) 150 MG tablet Take 300 mg by mouth 2 (two) times daily as needed for  heartburn.     No current facility-administered medications for this visit.    No Known Allergies    Review of Systems:   General:  normal appetite, decreased energy, no weight gain, no weight loss, no fever  Cardiac:  no chest pain with exertion, no chest pain at rest, + SOB with exertion, no resting SOB, no PND, no orthopnea, no palpitations, no arrhythmia, no atrial fibrillation, no LE edema, no dizzy spells, no syncope  Respiratory:  + shortness of breath, no home oxygen, no productive cough, no dry cough, no bronchitis, no wheezing, no hemoptysis, no asthma, no pain with inspiration or cough, no sleep apnea, no CPAP at night  GI:   no difficulty swallowing, no reflux, no frequent heartburn, no hiatal hernia, no abdominal pain, no constipation, no diarrhea, no hematochezia, no hematemesis, no melena  GU:   no dysuria,  no frequency, no urinary tract infection, no hematuria, no  kidney stones, no kidney disease  Vascular:  no pain suggestive of claudication, no pain in feet, + leg cramps, + varicose veins, no DVT, no non-healing foot ulcer  Neuro:   no stroke, no TIA's, no seizures, no headaches, no temporary blindness one eye,  no slurred speech, no peripheral neuropathy, no chronic pain, no instability of gait, no memory/cognitive dysfunction  Musculoskeletal: no arthritis, no joint swelling, no myalgias, no difficulty walking, normal mobility   Skin:   no rash, no itching, no skin infections, no pressure sores or ulcerations  Psych:   no anxiety, no depression, no nervousness, no unusual recent stress  Eyes:   no blurry vision, no floaters, no recent vision changes, + wears glasses or contacts  ENT:   no hearing loss, no loose or painful teeth, partial dentures, last saw dentist several years ago  Hematologic:  no easy bruising, no abnormal bleeding, no clotting disorder, no frequent epistaxis  Endocrine:  + diabetes, does not check CBG's at home           Physical Exam:   BP 147/59 mmHg  Pulse 51  Resp 20  Ht 5' (1.524 m)  Wt 109 lb (49.442 kg)  BMI 21.29 kg/m2  SpO2 96%  General:    well-appearing  HEENT:  Unremarkable   Neck:   no JVD, no bruits, no adenopathy   Chest:   clear to auscultation, symmetrical breath sounds, no wheezes, no rhonchi   CV:   RRR, grade III/VI crescendo/decrescendo murmur heard all across the precordium,  no diastolic murmur  Abdomen:  soft, non-tender, no masses   Extremities:  warm, well-perfused, pulses diminished, no LE edema  Rectal/GU  Deferred  Neuro:   Grossly non-focal and symmetrical throughout  Skin:   Clean and dry, no rashes, no breakdown   Diagnostic Tests:  Transthoracic Echocardiography  Patient:  Kara Santiago, Kara Santiago MR #:    [id number] Study Date: 10/16/2014 Gender:   F Age:    29 Height:   152.4 cm Weight:   49 kg BSA:    1.44 m^2 Pt. Status: Room:  REFERRING  Olga Millers SONOGRAPHER [place], Will Doreene Adas, Scott T REFERRING  [place], [place] T REFERRING  Tonny Bollman ATTENDING  Tobias Alexander, M.D. PERFORMING  [facility], Outpatient  cc:  ------------------------------------------------------------------- LV EF: 40%  ------------------------------------------------------------------- Indications:   (I35.0).  ------------------------------------------------------------------- History:  PMH: Acquired from the patient and from the patient&'s chart. Dyspnea. Aortic valve disease. Aortic stenosis. Mitral valve disease. Risk factors: Hypertension. Diabetes mellitus. Dyslipidemia.  ------------------------------------------------------------------- Study Conclusions  -  Left ventricle: There is mild diffuse hypokinesis more pronounced in the entire inferior and basal and mid inferolateral walls. The cavity size was normal. There was mild focal basal hypertrophy of the septum. Systolic function was mildly to moderately reduced. The estimated ejection fraction was 40%. Diffuse hypokinesis. Features are consistent with a pseudonormal left ventricular filling pattern, with concomitant abnormal relaxation and increased filling pressure (grade 2 diastolic dysfunction). Doppler parameters are consistent with elevated ventricular end-diastolic filling pressure. - Aortic valve: Trileaflet; severely thickened, severely calcified leaflets. Cusp separation was severely reduced. There was severe stenosis. There was mild to moderate regurgitation. Mean gradient (S): 34 mm Hg. Peak gradient (S): 57 mm Hg. Valve area (VTI): 0.57 cm^2. - Aortic root: The aortic root was normal in size. - Mitral valve: There was moderate regurgitation. - Left atrium: The atrium was mildly dilated. - Right ventricle: The cavity size was normal. Wall thickness was normal. Systolic function was normal. - Tricuspid  valve: There was mild regurgitation. - Pulmonic valve: There was trivial regurgitation. - Pulmonary arteries: Systolic pressure was mildly increased. PA peak pressure: 41 mm Hg (S). - Pericardium, extracardiac: A trivial pericardial effusion was identified posterior to the heart. Features were not consistent with tamponade physiology.  Impressions:  - Compared to the study from 03/04/2014 there is now severe aortic stenosis, mildly to moderately impaired LV systolic function with elevated filling pressures. Moderate MR. Mild pulmonary hypertension.  Transthoracic echocardiography. M-mode, complete 2D, spectral Doppler, and color Doppler. Birthdate: Patient birthdate: 28-Jun-1925. Age: Patient is 81 yr old. Sex: Gender: female. BMI: 21.1 kg/m^2. Blood pressure:   128/70 Patient status: Outpatient. Study date: Study date: 10/16/2014. Study time: 07:25 AM. Location: [facility]  -------------------------------------------------------------------  ------------------------------------------------------------------- Left ventricle: There is mild diffuse hypokinesis more pronounced in the entire inferior and basal and mid inferolateral walls. The cavity size was normal. There was mild focal basal hypertrophy of the septum. Systolic function was mildly to moderately reduced. The estimated ejection fraction was 40%. Diffuse hypokinesis. Features are consistent with a pseudonormal left ventricular filling pattern, with concomitant abnormal relaxation and increased filling pressure (grade 2 diastolic dysfunction). Doppler parameters are consistent with elevated ventricular end-diastolic filling pressure.  ------------------------------------------------------------------- Aortic valve:  Trileaflet; severely thickened, severely calcified leaflets. Cusp separation was severely reduced. Doppler:  There was severe stenosis.  There was mild to moderate  regurgitation. VTI ratio of LVOT to aortic valve: 0.18. Valve area (VTI): 0.57 cm^2. Indexed valve area (VTI): 0.4 cm^2/m^2. Peak velocity ratio of LVOT to aortic valve: 0.17. Valve area (Vmax): 0.54 cm^2. Indexed valve area (Vmax): 0.37 cm^2/m^2. Mean velocity ratio of LVOT to aortic valve: 0.18. Valve area (Vmean): 0.56 cm^2. Indexed valve area (Vmean): 0.39 cm^2/m^2.  Mean gradient (S): 34 mm Hg. Peak gradient (S): 57 mm Hg.  ------------------------------------------------------------------- Aorta: Aortic root: The aortic root was normal in size. Ascending aorta: The ascending aorta was normal in size.  ------------------------------------------------------------------- Mitral valve:  Mildly thickened leaflets . Mobility was not restricted. Doppler: Transvalvular velocity was within the normal range. There was no evidence for stenosis. There was moderate regurgitation.  Peak gradient (D): 6 mm Hg.  ------------------------------------------------------------------- Left atrium: The atrium was mildly dilated.  ------------------------------------------------------------------- Right ventricle: The cavity size was normal. Wall thickness was normal. Systolic function was normal.  ------------------------------------------------------------------- Pulmonic valve:  Structurally normal valve.  Cusp separation was normal. Doppler: Transvalvular velocity was within the normal range. There was no evidence for stenosis. There was trivial regurgitation.  ------------------------------------------------------------------- Tricuspid valve:  Structurally normal valve.  Doppler: Transvalvular velocity was within the normal range. There was mild regurgitation.  ------------------------------------------------------------------- Pulmonary artery:  The main pulmonary artery was normal-sized. Systolic pressure was mildly  increased.  ------------------------------------------------------------------- Right atrium: The atrium was normal in size.  ------------------------------------------------------------------- Pericardium: A trivial pericardial effusion was identified posterior to the heart. Doppler: Features were not consistent with tamponade physiology.  ------------------------------------------------------------------- Systemic veins: Inferior vena cava: The vessel was normal in size.  ------------------------------------------------------------------- Measurements  Left ventricle              Value     Reference LV ID, ED, PLAX chordal          49.5 mm    43 - 52 LV ID, ES, PLAX chordal          36.7 mm    23 - 38 LV fx shortening, PLAX chordal  (L)   26  %    >=29 LV PW thickness, ED            9.62 mm    --------- IVS/LV PW ratio, ED        (H)   1.32      <=1.3 Stroke volume, 2D             53  ml    --------- Stroke volume/bsa, 2D           37  ml/m^2  --------- LV e&', lateral              6.53 cm/s   --------- LV E/e&', lateral             18.53     --------- LV s&', lateral              10  cm/s   --------- LV e&', medial               3.51 cm/s   --------- LV E/e&', medial              34.47     --------- LV e&', average              5.02 cm/s   --------- LV E/e&', average             24.1      ---------  Ventricular septum            Value     Reference IVS thickness, ED             12.7 mm    ---------  LVOT                   Value     Reference LVOT ID, S                20  mm    --------- LVOT area                 3.14 cm^2    --------- LVOT ID                  20  mm    --------- LVOT peak velocity, S           65.2 cm/s   --------- LVOT mean velocity, S           47.9 cm/s   --------- LVOT VTI, S                16.9 cm    --------- LVOT  peak gradient, S           2   mm Hg  --------- Stroke volume (SV), LVOT DP        53.1 ml    --------- Stroke index (SV/bsa), LVOT DP      36.8 ml/m^2  ---------  Aortic valve               Value     Reference Aortic valve peak velocity, S       377  cm/s   --------- Aortic valve mean velocity, S       270  cm/s   --------- Aortic valve VTI, S            93.6 cm    --------- Aortic mean gradient, S          33  mm Hg  --------- Aortic peak gradient, S          57  mm Hg  --------- VTI ratio, LVOT/AV            0.18      --------- Aortic valve area, VTI          0.57 cm^2   --------- Aortic valve area/bsa, VTI        0.4  cm^2/m^2 --------- Velocity ratio, peak, LVOT/AV       0.17      --------- Aortic valve area, peak velocity     0.54 cm^2   --------- Aortic valve area/bsa, peak        0.37 cm^2/m^2 --------- velocity Velocity ratio, mean, LVOT/AV       0.18      --------- Aortic valve area, mean velocity     0.56 cm^2   --------- Aortic valve area/bsa, mean        0.39 cm^2/m^2 --------- velocity Aortic regurg pressure half-time     442  ms    ---------  Aorta                   Value     Reference Aortic root ID, ED            26  mm    --------- Ascending aorta ID, A-P, S        26  mm    ---------  Left atrium                Value     Reference LA ID, A-P, ES               44  mm    --------- LA ID/bsa, A-P          (H)   3.05 cm/m^2  <=2.2 LA volume, S               59  ml    --------- LA volume/bsa, S             40.9 ml/m^2  --------- LA volume, ES, 1-p A4C          71  ml    --------- LA volume/bsa, ES, 1-p A4C        49.2 ml/m^2  --------- LA volume, ES, 1-p A2C          44  ml    --------- LA volume/bsa, ES, 1-p A2C        30.5 ml/m^2  ---------  Mitral valve               Value  Reference Mitral E-wave peak velocity        121  cm/s   --------- Mitral A-wave peak velocity        92.3 cm/s   --------- Mitral deceleration time         183  ms    150 - 230 Mitral peak gradient, D          6   mm Hg  --------- Mitral E/A ratio, peak          1.3      ---------  Pulmonary arteries            Value     Reference PA pressure, S, DP        (H)   41  mm Hg  <=30  Tricuspid valve              Value     Reference Tricuspid regurg peak velocity      309  cm/s   --------- Tricuspid peak RV-RA gradient       38  mm Hg  ---------  Systemic veins              Value     Reference Estimated CVP               3   mm Hg  ---------  Right ventricle              Value     Reference RV pressure, S, DP        (H)   41  mm Hg  <=30 RV s&', lateral, S             10  cm/s   ---------  Legend: (L) and (H) mark values outside specified reference range.  ------------------------------------------------------------------- Prepared and Electronically Authenticated by  Tobias Alexander, M.D. [id number]:53:05       CARDIAC CATHETERIZATION  Conclusion    1. Patent stents in the LAD and LCx  without significant restenosis 2. Mild nonobstructive CAD 3. Mild segmental contraction abnormality of the LV with preserved LVEF 50% 4. Moderate mitral regurgitation 5. Severe aortic stenosis  Without significant CAD, suspect symptoms are related to valvular heart disease. Plan to update echo and refer to TCTS for consideration of TAVR.    Coronary Findings    Dominance: Right   Left Main  The vessel is angiographically normal.     Left Anterior Descending   . Prox LAD lesion, 0% stenosed. Previously placed Prox LAD drug eluting stent is patent.   . Mid LAD lesion, 30% stenosed. calcified, diffuse . Mid-LAD is tortuous     Left Circumflex   . Prox Cx lesion, 30% stenosed.   . Mid Cx lesion, 0% stenosed. Previously placed Mid Cx drug eluting stent is patent.     Right Coronary Artery   . Prox RCA lesion, 20% stenosed.       Right Heart Pressures Hemodynamic findings consistent with aortic stenosis. LV EDP is normal. Aortic Valve mean gradient 23 mmHg, peak-to-peak 35 mm Hg, AVA 0.64    Wall Motion                 Left Heart    Left Ventricle The left ventricular size is normal. There is mild left ventricular systolic dysfunction. There are wall motion abnormalities in the left ventricle. There are segmental wall motion abnormalities in the left ventricle.   Mitral Valve There is moderate (3+) mitral regurgitation.   Aortic  Valve There is severe aortic valve stenosis. The aortic valve is calcified. There is restricted aortic valve motion.    Coronary Diagrams    Diagnostic Diagram            Implants    Name ID Temporary Type Supply   No information to display    Hemo Data       Most Recent Value   Fick Cardiac Output  3.21 L/min   Fick Cardiac Output Index  2.23 (L/min)/BSA   Aortic Mean Gradient  23.3 mmHg   Aortic Peak Gradient  29 mmHg   Aortic Value Area Index  0.44 cm2/BSA   RA A Wave  3 mmHg   RA V Wave  3 mmHg   RA  Mean  2 mmHg   RV Systolic Pressure  26 mmHg   RV Diastolic Pressure  0 mmHg   RV EDP  2 mmHg   PA Systolic Pressure  33 mmHg   PA Diastolic Pressure  5 mmHg   PA Mean  16 mmHg   PW A Wave  7 mmHg   PW V Wave  4 mmHg   PW Mean  5 mmHg   AO Systolic Pressure  132 mmHg   AO Diastolic Pressure  49 mmHg   AO Mean  74 mmHg   LV Systolic Pressure  165 mmHg   LV Diastolic Pressure  6 mmHg   LV EDP  7 mmHg   Arterial Occlusion Pressure Extended Systolic Pressure  132 mmHg   Arterial Occlusion Pressure Extended Diastolic Pressure  49 mmHg   Arterial Occlusion Pressure Extended Mean Pressure  78 mmHg   Left Ventricular Apex Extended Systolic Pressure  167 mmHg   Left Ventricular Apex Extended Diastolic Pressure  1 mmHg   Left Ventricular Apex Extended EDP Pressure  6 mmHg   QP/QS  1   TPVR Index  7.17 HRUI   TSVR Index  26.93 HRUI   PVR SVR Ratio  0.19   TPVR/TSVR Ratio  0.27     Cardiac TAVR CT  TECHNIQUE: The patient was scanned on a Philips 256 scanner. A 120 kV retrospective scan was triggered in the descending thoracic aorta at 111 HU's. Gantry rotation speed was 270 msecs and collimation was .9 mm. No beta blockade or nitro were given. The 3D data set was reconstructed in 5% intervals of the R-R cycle. Systolic and diastolic phases were analyzed on a dedicated work station using MPR, MIP and VRT modes. The patient received 80 cc of contrast.  FINDINGS: Aortic Valve: Moderately thickened aortic valve leaflets. Non-coronary cusp is severely calcified and fixed with minimal motion. Left leaflet has minimal calcification, right none, both have moderately restricted motion. There are no subvalvular calcifications.  Aorta: Normal caliber, no dissection. Moderate diffuse calcifications predominantly in the aortic arch and descending thoracic aorta. There is significant atherosclerotic plaque in the descending aorta.  Sinotubular Junction: 24 x  22 mm  Ascending Thoracic Aorta: 28 x 27 mm  Descending Thoracic Aorta: 16 x 15 mm  Sinus of Valsalva Measurements:  Non-coronary: 25 mm  Right -coronary: 25 mm  Left -coronary: 26 mm  Coronary Artery Height above Annulus:  Left Main: 9 mm  Right Coronary: 11 mm  Virtual Basal Annulus Measurements:  Maximum/Minimum Diameter: 24 x 20 mm  Perimeter: 83 mm  Area: 370 mm2  Optimum Fluoroscopic Angle for Delivery: LAO 5 CAU 5  Other findings: Dilated pulmonary artery consistent with pulmonary hypertension.  Moderately dilated  left atrium.  IMPRESSION: 1. Moderately thickened aortic valve with severely calcified and fixed non-coronary cusp and annular measurements suitable for delivery of 23 mm Edward-SAPIEN 3 TAVR valve.  2. Sufficient annulus to coronary distance.  3. Optimal fluoroscopic angle for delivery is LAO 5 CAU 5.  Katarina Nelson Tobias Alexander  Electronically Signed  By: Tobias AlexanderKatarina Nelson  On: 10/24/2014 15:04   CT ANGIOGRAPHY CHEST, ABDOMEN AND PELVIS  TECHNIQUE: Multidetector CT imaging through the chest, abdomen and pelvis was performed using the standard protocol during bolus administration of intravenous contrast. Multiplanar reconstructed images and MIPs were obtained and reviewed to evaluate the vascular anatomy.  CONTRAST: 80mL OMNIPAQUE IOHEXOL 350 MG/ML SOLN  COMPARISON: No priors.  FINDINGS: CTA CHEST FINDINGS  Mediastinum/Lymph Nodes: Heart size is mildly enlarged with left atrial dilatation. There is no significant pericardial fluid, thickening or pericardial calcification. The apex of the left ventricle is immediately deep to the anterior aspect of the the left sixth/seventh intercostal space. Severely thickened and heavily calcified aortic valve (particularly the non coronary cusp). There is atherosclerosis of the thoracic aorta, the great vessels of the mediastinum and the coronary  arteries, including calcified atherosclerotic plaque in the left anterior descending and left circumflex coronary arteries. No pathologically enlarged mediastinal or hilar lymph nodes. Small hiatal hernia. No axillary lymphadenopathy.  Lungs/Pleura: Calcified granuloma in the right upper lobe with adjacent area of architectural distortion which presumably reflects some post infectious/ inflammatory changes. Nodular bilateral apical pleural parenchymal thickening, favored to reflect chronic post infectious or inflammatory scarring. No other suspicious appearing pulmonary nodules or masses are noted. No acute consolidative airspace disease. No pleural effusions.  Musculoskeletal/Soft Tissues: Multiple vertebral body compression fractures, chronic appearing T10 compression fracture with approximately 50% loss of anterior vertebral body height. Mild compression of the superior endplate of T11 as well, with approximately 20% loss of central vertebral body height. There are no aggressive appearing lytic or blastic lesions noted in the visualized portions of the skeleton.  CTA ABDOMEN AND PELVIS FINDINGS  Hepatobiliary: No cystic or solid hepatic lesions. No intra or extrahepatic biliary ductal dilatation. Gallbladder is normal in appearance.  Pancreas: No pancreatic mass. No pancreatic ductal dilatation. No pancreatic or peripancreatic fluid or inflammatory changes.  Spleen: Unremarkable.  Adrenals/Urinary Tract: Bilateral kidneys and bilateral adrenal glands are normal in appearance. Right-sided extra renal pelvis (normal anatomical variant). No hydroureteronephrosis or perinephric stranding to indicate urinary tract obstruction at this time. Urinary bladder is normal in appearance.  Stomach/Bowel: Normal appearance of the stomach. No pathologic dilatation of small bowel or colon. Numerous colonic diverticulae are noted, without surrounding inflammatory changes to suggest  an acute diverticulitis at this time.  Vascular/Lymphatic: Vascular findings and measurements pertinent to potential TAVR procedure, as detailed below. Single renal arteries bilaterally. Celiac axis, superior mesenteric artery and inferior mesenteric artery are all patent. Extensive atheromatous plaque throughout the abdominal aorta.  Reproductive: Uterus and ovaries are atrophic.  Other: No significant volume of ascites. No pneumoperitoneum.  Musculoskeletal: Old vertebral body compression fracture of L1 with approximately 25% loss of anterior vertebral body height. There are no aggressive appearing lytic or blastic lesions noted in the visualized portions of the skeleton.  VASCULAR MEASUREMENTS PERTINENT TO TAVR:  AORTA:  Minimal Aortic Diameter - 8 x 6 mm  Severity of Aortic Calcification - moderate to severe  RIGHT PELVIS:  Right Common Iliac Artery -  Minimal Diameter - 4.8 x 4.4 mm  Tortuosity - mild to moderate  Calcification - moderate to severe  Right External Iliac Artery -  Minimal Diameter - 5.4 x 4.1 mm  Tortuosity - mild  Calcification - mild  Right Common Femoral Artery -  Minimal Diameter - 4.5 x 3.9 mm  Tortuosity - mild  Calcification - mild to moderate  LEFT PELVIS:  Left Common Iliac Artery -  Minimal Diameter - 7.9 x 4.9 mm  Tortuosity - moderate to severe  Calcification - moderate to severe  Left External Iliac Artery -  Minimal Diameter - 5.4 x 5.6 mm  Tortuosity - mild  Calcification - mild  Left Common Femoral Artery -  Minimal Diameter - 4.9 x 4.6 mm  Tortuosity - mild  Calcification - mild to moderate  Review of the MIP images confirms the above findings.  IMPRESSION: 1. Vascular findings and measurements pertinent to potential TAVR procedure, as detailed above. This patient does not appear to have suitable pelvic arterial access based predominantly on the small size  of her pelvic vessels. 2. The apex of the left ventricle is immediately deep to the anterior aspect of the the left sixth/seventh intercostal space. 3. Bilateral apical nodular pleuroparenchymal thickening, strongly favored to represent areas of chronic post infectious or inflammatory scarring. Unfortunately, we have no prior studies available for comparison. Repeat noncontrast chest CT is recommended in 6 months to ensure the stability of these findings. 4. Colonic diverticulosis without findings to suggest acute diverticulitis at this time. 5. Additional incidental findings, as above.   Electronically Signed  By: Trudie Reed M.D.  On: 10/23/2014 11:41   Results for OUITA, NISH (MRN [id number]) as of 11/21/2014 19:04  Ref. Range 10/23/2014 11:20  FVC-Pre Latest Units: L 1.67  FVC-%Pred-Pre Latest Units: % 97  FEV1-Pre Latest Units: L 1.15  FEV1-%Pred-Pre Latest Units: % 93  Pre FEV1/FVC ratio Latest Units: % 69  FEV1FVC-%Pred-Pre Latest Units: % 95  FEF 25-75 Pre Latest Units: L/sec 0.66  FEF2575-%Pred-Pre Latest Units: % 86  FEV6-Pre Latest Units: L 1.63  FEV6-%Pred-Pre Latest Units: % 103  Pre FEV6/FVC Ratio Latest Units: % 97  FEV6FVC-%Pred-Pre Latest Units: % 105  FVC-Post Latest Units: L 1.75  FVC-%Pred-Post Latest Units: % 102  FVC-%Change-Post Latest Units: % 4  FEV1-Post Latest Units: L 1.29  FEV1-%Pred-Post Latest Units: % 104  FEV1-%Change-Post Latest Units: % 12  Post FEV1/FVC ratio Latest Units: % 74  FEV1FVC-%Change-Post Latest Units: % 7  FEF 25-75 Post Latest Units: L/sec 1.11  FEF2575-%Pred-Post Latest Units: % 146  FEF2575-%Change-Post Latest Units: % 69  FEV6-Post Latest Units: L 1.72  FEV6-%Pred-Post Latest Units: % 108  FEV6-%Change-Post Latest Units: % 5  Post FEV6/FVC ratio Latest Units: % 98  FEV6FVC-%Pred-Post Latest Units: % 107  FEV6FVC-%Change-Post Latest Units: % 1  TLC Latest Units: L 3.77  TLC % pred Latest Units: % 87  RV  Latest Units: L 2.01  RV % pred Latest Units: % 88  DLCO unc Latest Units: ml/min/mmHg 9.03  DLCO unc % pred Latest Units: % 51  DL/VA Latest Units: ml/min/mmHg/L 3.40  DL/VA % pred Latest Units: % 83     STS Risk Calculator  Procedure    AVR  Risk of Mortality   12.8% Morbidity or Mortality  38.2% Prolonged LOS   22.1% Short LOS    7.0% Permanent Stroke   6.7% Prolonged Vent Support  28.9% DSW Infection    0.1% Renal Failure    12.0% Reoperation    14.3%    Impression:  Patient has stage D severe symptomatic aortic  stenosis. She presents with recent onset symptoms of exertional shortness of breath and fatigue consistent with chronic combined systolic and diastolic congestive heart failure New York Heart Association functional class IIB-III.  I have personally reviewed the patient's recent transthoracic echocardiogram and diagnostic cardiac catheterization. She has moderate global left ventricular systolic dysfunction with ejection fraction estimated 40%. The aortic valve is heavily calcified with severe leaflet restriction involving all 3 leaflets of the aortic valve. Although the peak velocity was less than 4 m/s and the mean transvalvular gradient less than 40 mmHg, the patient clearly has severe aortic stenosis with very low dimensionless velocity ratio and moderate left ventricular systolic dysfunction. The patient also has at least moderate mitral regurgitation. Patient has known history of coronary artery disease but previously placed stents in both the left anterior to send coronary artery and the left circumflex coronary artery remained widely patent and free of significant disease. At this point the patient has no significant flow limiting stenosis. Risks associated with conventional surgical aortic valve replacement would unquestionably be extremely high. In addition to the patient's age and associated comorbid medical problems, she has a relatively small sized aortic root and  aortic annulus with severe calcification involving the proximal aortic root and ascending thoracic aorta. In the setting of coexisting significant mitral regurgitation, conventional surgery would come with exceptionally high risk. Based upon review of the patient's recent CT angiograms the patient appears to be reasonably good candidate for transcatheter aortic valve replacement. She appears to have borderline adequate pelvic vascular access for a transfemoral approach. Transapical approach could be considered if the patient's pelvic vasculature proves to be too small.   Plan:  The patient and her daughter were counseled at length regarding treatment alternatives for management of severe symptomatic aortic stenosis. Alternative approaches such as conventional aortic valve replacement, transcatheter aortic valve replacement, and palliative medical therapy were compared and contrasted at length.  The risks associated with conventional surgical aortic valve replacement were been discussed in detail, as were expectations for post-operative convalescence. Long-term prognosis with medical therapy was discussed. This discussion was placed in the context of the patient's own specific clinical presentation and past medical history.  All of their questions been addressed.  The patient plans to proceed with transcatheter aortic valve replacement next week as previously planned by Dr. Laneta Simmers and Dr. Excell Seltzer.  Following the decision to proceed with transcatheter aortic valve replacement, a discussion has been held regarding what types of management strategies would be attempted intraoperatively in the event of life-threatening complications, including whether or not the patient would be considered a candidate for the use of cardiopulmonary bypass and/or conversion to open sternotomy for attempted surgical intervention.  The patient has been advised of a variety of complications that might develop including but not limited  to risks of death, stroke, paravalvular leak, aortic dissection or other major vascular complications, aortic annulus rupture, device embolization, cardiac rupture or perforation, mitral regurgitation, acute myocardial infarction, arrhythmia, heart block or bradycardia requiring permanent pacemaker placement, congestive heart failure, respiratory failure, renal failure, pneumonia, infection, other late complications related to structural valve deterioration or migration, or other complications that might ultimately cause a temporary or permanent loss of functional independence or other long term morbidity.  The patient provides full informed consent for the procedure as described and all questions were answered.   I spent in excess of 90 minutes during the conduct of this office consultation and >50% of this time involved direct face-to-face encounter with the patient  for counseling and/or coordination of their care.     Salvatore Decent. Cornelius Moras, MD 11/21/2014 7:02 PM

## 2014-11-21 NOTE — Patient Instructions (Signed)
  Patient should continue taking all medications without change through the day before surgery.  Patient should have nothing to eat or drink after midnight the night before surgery.  On the morning of surgery patient should take only metoprolol with a sip of water.    

## 2014-11-21 NOTE — Pre-Procedure Instructions (Signed)
Kara Santiago  11/21/2014      [id number] - [place], [place] - [address] [place] [address] Phone: [phone] Fax: [phone]  [facility] - [place], [place] - [address] [place] [address] Phone: [phone] Fax: [phone]    Your procedure is scheduled on 11/26/2014  Report to [facility] Admitting at 11:00 A.M.  Call this number if you have problems the morning of surgery:  [phone]   Remember:  Do not eat food or drink liquids after midnight. On MONDAY  Take these medicines the morning of surgery with A SIP OF WATER  : TAKE  Metoprolol, and take acid reducer if you feel the need    Do not wear jewelry, make-up or nail polish.  Do not wear lotions, powders, or perfumes.  You may wear deodorant.  Do not shave 48 hours prior to surgery.    Do not bring valuables to the hospital.  [facility] is not responsible for any belongings or valuables.  Contacts, dentures or bridgework may not be worn into surgery.  Leave your suitcase in the car.  After surgery it may be brought to your room.  For patients admitted to the hospital, discharge time will be determined by your treatment team.  Patients discharged the day of surgery will not be allowed to drive home.   Name and phone number of your driver:   /w family Special instructions:  Special Instructions: [place] - Preparing for Surgery  Before surgery, you can play an important role.  Because skin is not sterile, your skin needs to be as free of germs as possible.  You can reduce the number of germs on you skin by washing with CHG (chlorahexidine gluconate) soap before surgery.  CHG is an antiseptic cleaner which kills germs and bonds with the skin to continue killing germs even after washing.  Please DO NOT use if you have an allergy to CHG or antibacterial soaps.  If your skin becomes reddened/irritated stop  using the CHG and inform your nurse when you arrive at Short Stay.  Do not shave (including legs and underarms) for at least 48 hours prior to the first CHG shower.  You may shave your face.  Please follow these instructions carefully:   1.  Shower with CHG Soap the night before surgery and the  morning of Surgery.  2.  If you choose to wash your hair, wash your hair first as usual with your  normal shampoo.  3.  After you shampoo, rinse your hair and body thoroughly to remove the  Shampoo.  4.  Use CHG as you would any other liquid soap.  You can apply chg directly to the skin and wash gently with scrungie or a clean washcloth.  5.  Apply the CHG Soap to your body ONLY FROM THE NECK DOWN.    Do not use on open wounds or open sores.  Avoid contact with your eyes, ears, mouth and genitals (private parts).  Wash genitals (private parts)   with your normal soap.  6.  Wash thoroughly, paying special attention to the area where your surgery will be performed.  7.  Thoroughly rinse your body with warm water from the neck down.  8.  DO NOT shower/wash with your normal soap after using and rinsing off   the CHG Soap.  9.  Pat yourself dry with a clean towel.  10.  Wear clean pajamas.            11.  Place clean sheets on your bed the night of your first shower and do not sleep with pets.  Day of Surgery  Do not apply any lotions/deodorants the morning of surgery.  Please wear clean clothes to the hospital/surgery center.  Please read over the following fact sheets that you were given. Pain Booklet, Coughing and Deep Breathing, Blood Transfusion Information, Open Heart Packet, MRSA Information and Surgical Site Infection Prevention

## 2014-11-22 LAB — HEMOGLOBIN A1C
Hgb A1c MFr Bld: 5.9 % — ABNORMAL HIGH (ref 4.8–5.6)
Mean Plasma Glucose: 123 mg/dL

## 2014-11-25 MED ORDER — SODIUM CHLORIDE 0.9 % IV SOLN
INTRAVENOUS | Status: DC
  Filled 2014-11-25 (×2): qty 30

## 2014-11-25 MED ORDER — VANCOMYCIN HCL 10 G IV SOLR
1250.0000 mg | INTRAVENOUS | Status: AC
  Administered 2014-11-26: 1250 mg via INTRAVENOUS
  Filled 2014-11-25 (×2): qty 1250

## 2014-11-25 MED ORDER — POTASSIUM CHLORIDE 2 MEQ/ML IV SOLN
80.0000 meq | INTRAVENOUS | Status: DC
  Filled 2014-11-25 (×2): qty 40

## 2014-11-25 MED ORDER — SODIUM CHLORIDE 0.9 % IV SOLN
INTRAVENOUS | Status: DC
  Filled 2014-11-25 (×2): qty 2.5

## 2014-11-25 MED ORDER — DEXMEDETOMIDINE HCL IN NACL 400 MCG/100ML IV SOLN
0.1000 ug/kg/h | INTRAVENOUS | Status: AC
  Administered 2014-11-26: .3 ug/kg/h via INTRAVENOUS
  Filled 2014-11-25 (×2): qty 100

## 2014-11-25 MED ORDER — MAGNESIUM SULFATE 50 % IJ SOLN
40.0000 meq | INTRAMUSCULAR | Status: DC
  Filled 2014-11-25 (×2): qty 10

## 2014-11-25 MED ORDER — METOPROLOL TARTRATE 12.5 MG HALF TABLET
12.5000 mg | ORAL_TABLET | Freq: Once | ORAL | Status: DC
  Filled 2014-11-25: qty 1

## 2014-11-25 MED ORDER — DEXTROSE 5 % IV SOLN
0.0000 ug/min | INTRAVENOUS | Status: DC
  Filled 2014-11-25 (×2): qty 4

## 2014-11-25 MED ORDER — PHENYLEPHRINE HCL 10 MG/ML IJ SOLN
30.0000 ug/min | INTRAMUSCULAR | Status: DC
  Filled 2014-11-25 (×2): qty 2

## 2014-11-25 MED ORDER — DOPAMINE-DEXTROSE 3.2-5 MG/ML-% IV SOLN
0.0000 ug/kg/min | INTRAVENOUS | Status: DC
  Filled 2014-11-25: qty 250

## 2014-11-25 MED ORDER — DEXTROSE 5 % IV SOLN
1.5000 g | INTRAVENOUS | Status: AC
  Administered 2014-11-26: 1.5 g via INTRAVENOUS
  Filled 2014-11-25: qty 1.5

## 2014-11-25 MED ORDER — CHLORHEXIDINE GLUCONATE 4 % EX LIQD: 30.0000 mL | CUTANEOUS | Status: DC

## 2014-11-25 MED ORDER — NITROGLYCERIN IN D5W 200-5 MCG/ML-% IV SOLN
2.0000 ug/min | INTRAVENOUS | Status: AC
  Administered 2014-11-26: 20 ug/min via INTRAVENOUS
  Filled 2014-11-25 (×2): qty 250

## 2014-11-25 NOTE — Anesthesia Preprocedure Evaluation (Addendum)
Anesthesia Evaluation  Patient identified by MRN, date of birth, ID band Patient awake    Reviewed: Allergy & Precautions, NPO status , Patient's Chart, lab work & pertinent test results  Airway Mallampati: II  TM Distance: >3 FB Neck ROM: Full    Dental  (+) Partial Upper, Partial Lower   Pulmonary former smoker,  breath sounds clear to auscultation        Cardiovascular hypertension, Rhythm:Regular Rate:Normal + Systolic murmurs    Neuro/Psych    GI/Hepatic   Endo/Other  diabetes  Renal/GU      Musculoskeletal   Abdominal   Peds  Hematology   Anesthesia Other Findings   Reproductive/Obstetrics                            Anesthesia Physical Anesthesia Plan  ASA: III  Anesthesia Plan: General   Post-op Pain Management:    Induction: Intravenous  Airway Management Planned: Oral ETT  Additional Equipment: Arterial line, 3D TEE, CVP, PA Cath and Ultrasound Guidance Line Placement  Intra-op Plan:   Post-operative Plan: Extubation in OR  Informed Consent: I have reviewed the patients History and Physical, chart, labs and discussed the procedure including the risks, benefits and alternatives for the proposed anesthesia with the patient or authorized representative who has indicated his/her understanding and acceptance.   Dental advisory given  Plan Discussed with: CRNA and Anesthesiologist  Anesthesia Plan Comments:         Anesthesia Quick Evaluation

## 2014-11-26 ENCOUNTER — Encounter (HOSPITAL_COMMUNITY): Payer: Self-pay | Admitting: *Deleted

## 2014-11-26 ENCOUNTER — Encounter (HOSPITAL_COMMUNITY)
Admission: EM | Disposition: A | Discharge status: Home / Self Care | Payer: Medicare Other | Source: Ambulatory Visit | Attending: Cardiovascular Disease

## 2014-11-26 ENCOUNTER — Inpatient Hospital Stay (HOSPITAL_COMMUNITY): Payer: Medicare Other

## 2014-11-26 ENCOUNTER — Inpatient Hospital Stay (HOSPITAL_COMMUNITY)
Admission: EM | Admit: 2014-11-26 | Discharge: 2014-11-29 | DRG: 266 | Disposition: A | Discharge status: Home / Self Care | Payer: Medicare Other | Source: Ambulatory Visit | Attending: Cardiovascular Disease | Admitting: Cardiovascular Disease

## 2014-11-26 ENCOUNTER — Inpatient Hospital Stay (HOSPITAL_COMMUNITY): Payer: Medicare Other | Admitting: Anesthesiology

## 2014-11-26 DIAGNOSIS — I350 Nonrheumatic aortic (valve) stenosis: Secondary | ICD-10-CM

## 2014-11-26 DIAGNOSIS — I5043 Acute on chronic combined systolic (congestive) and diastolic (congestive) heart failure: Secondary | ICD-10-CM | POA: Diagnosis not present

## 2014-11-26 DIAGNOSIS — L299 Pruritus, unspecified: Secondary | ICD-10-CM | POA: Diagnosis not present

## 2014-11-26 DIAGNOSIS — E785 Hyperlipidemia, unspecified: Secondary | ICD-10-CM | POA: Diagnosis present

## 2014-11-26 DIAGNOSIS — I10 Essential (primary) hypertension: Secondary | ICD-10-CM | POA: Diagnosis present

## 2014-11-26 DIAGNOSIS — I442 Atrioventricular block, complete: Secondary | ICD-10-CM | POA: Diagnosis not present

## 2014-11-26 DIAGNOSIS — D62 Acute posthemorrhagic anemia: Secondary | ICD-10-CM | POA: Diagnosis not present

## 2014-11-26 DIAGNOSIS — I6529 Occlusion and stenosis of unspecified carotid artery: Secondary | ICD-10-CM | POA: Diagnosis present

## 2014-11-26 DIAGNOSIS — I679 Cerebrovascular disease, unspecified: Secondary | ICD-10-CM | POA: Diagnosis present

## 2014-11-26 DIAGNOSIS — E119 Type 2 diabetes mellitus without complications: Secondary | ICD-10-CM | POA: Diagnosis present

## 2014-11-26 DIAGNOSIS — I080 Rheumatic disorders of both mitral and aortic valves: Secondary | ICD-10-CM | POA: Diagnosis not present

## 2014-11-26 DIAGNOSIS — Z953 Presence of xenogenic heart valve: Secondary | ICD-10-CM

## 2014-11-26 DIAGNOSIS — Z955 Presence of coronary angioplasty implant and graft: Secondary | ICD-10-CM

## 2014-11-26 DIAGNOSIS — I2510 Atherosclerotic heart disease of native coronary artery without angina pectoris: Secondary | ICD-10-CM | POA: Diagnosis present

## 2014-11-26 DIAGNOSIS — I739 Peripheral vascular disease, unspecified: Secondary | ICD-10-CM | POA: Diagnosis present

## 2014-11-26 DIAGNOSIS — I359 Nonrheumatic aortic valve disorder, unspecified: Secondary | ICD-10-CM | POA: Diagnosis not present

## 2014-11-26 DIAGNOSIS — Z006 Encounter for examination for normal comparison and control in clinical research program: Secondary | ICD-10-CM

## 2014-11-26 HISTORY — DX: Nonrheumatic aortic (valve) stenosis: I35.0

## 2014-11-26 HISTORY — PX: TEE WITHOUT CARDIOVERSION: SHX5443

## 2014-11-26 HISTORY — PX: TRANSCATHETER AORTIC VALVE REPLACEMENT, TRANSFEMORAL: SHX6400

## 2014-11-26 LAB — GLUCOSE, CAPILLARY
GLUCOSE-CAPILLARY: 63 mg/dL — AB (ref 65–99)
GLUCOSE-CAPILLARY: 93 mg/dL (ref 65–99)
Glucose-Capillary: 101 mg/dL — ABNORMAL HIGH (ref 65–99)
Glucose-Capillary: 109 mg/dL — ABNORMAL HIGH (ref 65–99)

## 2014-11-26 LAB — POCT I-STAT, CHEM 8
BUN: 10 mg/dL (ref 6–20)
BUN: 8 mg/dL (ref 6–20)
BUN: 8 mg/dL (ref 6–20)
CALCIUM ION: 1.26 mmol/L (ref 1.13–1.30)
CALCIUM ION: 1.19 mmol/L (ref 1.13–1.30)
CREATININE: 0.6 mg/dL (ref 0.44–1.00)
Calcium, Ion: 1.17 mmol/L (ref 1.13–1.30)
Chloride: 106 mmol/L (ref 101–111)
Chloride: 107 mmol/L (ref 101–111)
Chloride: 109 mmol/L (ref 101–111)
Creatinine, Ser: 0.6 mg/dL (ref 0.44–1.00)
Creatinine, Ser: 0.6 mg/dL (ref 0.44–1.00)
GLUCOSE: 120 mg/dL — AB (ref 65–99)
Glucose, Bld: 112 mg/dL — ABNORMAL HIGH (ref 65–99)
Glucose, Bld: 148 mg/dL — ABNORMAL HIGH (ref 65–99)
HCT: 35 % — ABNORMAL LOW (ref 36.0–46.0)
HCT: 31 % — ABNORMAL LOW (ref 36.0–46.0)
HCT: 32 % — ABNORMAL LOW (ref 36.0–46.0)
HEMOGLOBIN: 10.5 g/dL — AB (ref 12.0–15.0)
Hemoglobin: 11.9 g/dL — ABNORMAL LOW (ref 12.0–15.0)
Hemoglobin: 10.9 g/dL — ABNORMAL LOW (ref 12.0–15.0)
POTASSIUM: 3.9 mmol/L (ref 3.5–5.1)
Potassium: 4.1 mmol/L (ref 3.5–5.1)
Potassium: 3.8 mmol/L (ref 3.5–5.1)
Sodium: 139 mmol/L (ref 135–145)
Sodium: 140 mmol/L (ref 135–145)
Sodium: 142 mmol/L (ref 135–145)
TCO2: 25 mmol/L (ref 0–100)
TCO2: 24 mmol/L (ref 0–100)
TCO2: 22 mmol/L (ref 0–100)

## 2014-11-26 LAB — CBC
HCT: 34 % — ABNORMAL LOW (ref 36.0–46.0)
Hemoglobin: 11 g/dL — ABNORMAL LOW (ref 12.0–15.0)
MCH: 30.7 pg (ref 26.0–34.0)
MCHC: 32.4 g/dL (ref 30.0–36.0)
MCV: 95 fL (ref 78.0–100.0)
PLATELETS: 193 10*3/uL (ref 150–400)
RBC: 3.58 MIL/uL — AB (ref 3.87–5.11)
RDW: 14.9 % (ref 11.5–15.5)
WBC: 5.4 10*3/uL (ref 4.0–10.5)

## 2014-11-26 LAB — POCT I-STAT 3, ART BLOOD GAS (G3+)
Acid-base deficit: 2 mmol/L (ref 0.0–2.0)
Acid-base deficit: 4 mmol/L — ABNORMAL HIGH (ref 0.0–2.0)
BICARBONATE: 23 meq/L (ref 20.0–24.0)
Bicarbonate: 23.4 mEq/L (ref 20.0–24.0)
O2 SAT: 100 %
O2 SAT: 94 %
PCO2 ART: 45.7 mmHg — AB (ref 35.0–45.0)
PO2 ART: 73 mmHg — AB (ref 80.0–100.0)
Patient temperature: 36
TCO2: 25 mmol/L (ref 0–100)
TCO2: 24 mmol/L (ref 0–100)
pCO2 arterial: 41 mmHg (ref 35.0–45.0)
pH, Arterial: 7.365 (ref 7.350–7.450)
pH, Arterial: 7.305 — ABNORMAL LOW (ref 7.350–7.450)
pO2, Arterial: 197 mmHg — ABNORMAL HIGH (ref 80.0–100.0)

## 2014-11-26 LAB — POCT I-STAT 4, (NA,K, GLUC, HGB,HCT)
Glucose, Bld: 144 mg/dL — ABNORMAL HIGH (ref 65–99)
HEMATOCRIT: 34 % — AB (ref 36.0–46.0)
HEMOGLOBIN: 11.6 g/dL — AB (ref 12.0–15.0)
Potassium: 4.1 mmol/L (ref 3.5–5.1)
SODIUM: 141 mmol/L (ref 135–145)

## 2014-11-26 LAB — PREPARE RBC (CROSSMATCH)

## 2014-11-26 LAB — PROTIME-INR
INR: 1.26 (ref 0.00–1.49)
PROTHROMBIN TIME: 16 s — AB (ref 11.6–15.2)

## 2014-11-26 LAB — APTT: aPTT: 32 seconds (ref 24–37)

## 2014-11-26 SURGERY — IMPLANTATION, AORTIC VALVE, TRANSCATHETER, FEMORAL APPROACH
Anesthesia: General | Site: Chest

## 2014-11-26 MED ORDER — IODIXANOL 320 MG/ML IV SOLN
INTRAVENOUS | Status: DC | PRN
  Administered 2014-11-26: 10 mL via INTRAVENOUS

## 2014-11-26 MED ORDER — PANTOPRAZOLE SODIUM 40 MG PO TBEC
40.0000 mg | DELAYED_RELEASE_TABLET | Freq: Every day | ORAL | Status: DC
  Administered 2014-11-28 – 2014-11-29 (×2): 40 mg via ORAL
  Filled 2014-11-26 (×2): qty 1

## 2014-11-26 MED ORDER — ATROPINE SULFATE 0.1 MG/ML IJ SOLN
INTRAMUSCULAR | Status: AC
  Filled 2014-11-26: qty 10

## 2014-11-26 MED ORDER — DEXTROSE 50 % IV SOLN
INTRAVENOUS | Status: AC
  Administered 2014-11-26: 25 mL via INTRAVENOUS
  Filled 2014-11-26: qty 50

## 2014-11-26 MED ORDER — NOREPINEPHRINE BITARTRATE 1 MG/ML IV SOLN
4000.0000 ug | INTRAVENOUS | Status: DC | PRN
  Administered 2014-11-26: 2 ug/min via INTRAVENOUS

## 2014-11-26 MED ORDER — ATORVASTATIN CALCIUM 80 MG PO TABS
80.0000 mg | ORAL_TABLET | Freq: Every day | ORAL | Status: DC
  Administered 2014-11-27 – 2014-11-28 (×2): 80 mg via ORAL
  Filled 2014-11-26 (×4): qty 1

## 2014-11-26 MED ORDER — ROCURONIUM BROMIDE 50 MG/5ML IV SOLN
INTRAVENOUS | Status: AC
  Filled 2014-11-26: qty 1

## 2014-11-26 MED ORDER — ALBUMIN HUMAN 5 % IV SOLN
250.0000 mL | INTRAVENOUS | Status: AC | PRN
  Administered 2014-11-26: 250 mL via INTRAVENOUS
  Filled 2014-11-26: qty 250

## 2014-11-26 MED ORDER — LIDOCAINE HCL (CARDIAC) 20 MG/ML IV SOLN
INTRAVENOUS | Status: DC | PRN
  Administered 2014-11-26: 40 mg via INTRAVENOUS

## 2014-11-26 MED ORDER — 0.9 % SODIUM CHLORIDE (POUR BTL) OPTIME
TOPICAL | Status: DC | PRN
  Administered 2014-11-26: 4000 mL

## 2014-11-26 MED ORDER — EPINEPHRINE HCL 1 MG/ML IJ SOLN
INTRAMUSCULAR | Status: AC
  Filled 2014-11-26: qty 2

## 2014-11-26 MED ORDER — DEXMEDETOMIDINE HCL IN NACL 200 MCG/50ML IV SOLN: 0.1000 ug/kg/h | INTRAVENOUS | Status: DC

## 2014-11-26 MED ORDER — ACETAMINOPHEN 160 MG/5ML PO SOLN
1000.0000 mg | Freq: Four times a day (QID) | ORAL | Status: DC
  Filled 2014-11-26: qty 40

## 2014-11-26 MED ORDER — ACETAMINOPHEN 650 MG RE SUPP
650.0000 mg | Freq: Once | RECTAL | Status: AC
  Administered 2014-11-26: 650 mg via RECTAL
  Filled 2014-11-26: qty 1

## 2014-11-26 MED ORDER — FENTANYL CITRATE (PF) 250 MCG/5ML IJ SOLN
INTRAMUSCULAR | Status: AC
  Filled 2014-11-26: qty 5

## 2014-11-26 MED ORDER — PROPOFOL 10 MG/ML IV BOLUS
INTRAVENOUS | Status: AC
  Filled 2014-11-26: qty 20

## 2014-11-26 MED ORDER — IBUPROFEN 200 MG PO TABS: 200.0000 mg | ORAL_TABLET | Freq: Four times a day (QID) | ORAL | Status: DC | PRN

## 2014-11-26 MED ORDER — PHENYLEPHRINE HCL 10 MG/ML IJ SOLN
0.0000 ug/min | INTRAVENOUS | Status: DC
  Filled 2014-11-26: qty 2

## 2014-11-26 MED ORDER — LISINOPRIL 40 MG PO TABS
40.0000 mg | ORAL_TABLET | Freq: Every day | ORAL | Status: DC
  Filled 2014-11-26: qty 1

## 2014-11-26 MED ORDER — ACETAMINOPHEN 500 MG PO TABS
1000.0000 mg | ORAL_TABLET | Freq: Four times a day (QID) | ORAL | Status: DC
  Administered 2014-11-26 – 2014-11-29 (×10): 1000 mg via ORAL
  Filled 2014-11-26 (×16): qty 2

## 2014-11-26 MED ORDER — IODIXANOL 320 MG/ML IV SOLN
INTRAVENOUS | Status: DC | PRN
  Administered 2014-11-26: 120.7 mL via INTRAVENOUS

## 2014-11-26 MED ORDER — CHLORHEXIDINE GLUCONATE 4 % EX LIQD: 60.0000 mL | Freq: Once | CUTANEOUS | Status: DC

## 2014-11-26 MED ORDER — ALBUMIN HUMAN 5 % IV SOLN
INTRAVENOUS | Status: DC | PRN
Start: 2014-11-26 — End: 2014-11-26
  Administered 2014-11-26: 13:00:00 via INTRAVENOUS

## 2014-11-26 MED ORDER — REMIFENTANIL HCL 1 MG IV SOLR
INTRAVENOUS | Status: DC | PRN
  Administered 2014-11-26: .125 ug/kg/min via INTRAVENOUS
  Administered 2014-11-26: .5 ug/kg/min via INTRAVENOUS

## 2014-11-26 MED ORDER — PROTAMINE SULFATE 10 MG/ML IV SOLN
INTRAVENOUS | Status: AC
  Filled 2014-11-26: qty 5

## 2014-11-26 MED ORDER — CETYLPYRIDINIUM CHLORIDE 0.05 % MT LIQD
7.0000 mL | Freq: Two times a day (BID) | OROMUCOSAL | Status: DC
  Administered 2014-11-26 – 2014-11-28 (×4): 7 mL via OROMUCOSAL

## 2014-11-26 MED ORDER — DEXTROSE 50 % IV SOLN
25.0000 mL | Freq: Once | INTRAVENOUS | Status: AC
  Administered 2014-11-26: 25 mL via INTRAVENOUS

## 2014-11-26 MED ORDER — LIDOCAINE HCL (CARDIAC) 20 MG/ML IV SOLN
INTRAVENOUS | Status: AC
  Filled 2014-11-26: qty 5

## 2014-11-26 MED ORDER — METOPROLOL TARTRATE 12.5 MG HALF TABLET
12.5000 mg | ORAL_TABLET | Freq: Two times a day (BID) | ORAL | Status: DC
  Filled 2014-11-26: qty 1

## 2014-11-26 MED ORDER — HEPARIN SODIUM (PORCINE) 1000 UNIT/ML IJ SOLN
INTRAMUSCULAR | Status: DC | PRN
  Administered 2014-11-26: 6000 [IU] via INTRAVENOUS

## 2014-11-26 MED ORDER — ONDANSETRON HCL 4 MG/2ML IJ SOLN
INTRAMUSCULAR | Status: DC | PRN
  Administered 2014-11-26: 4 mg via INTRAVENOUS

## 2014-11-26 MED ORDER — LACTATED RINGERS IV SOLN
INTRAVENOUS | Status: DC | PRN
  Administered 2014-11-26: 12:00:00 via INTRAVENOUS

## 2014-11-26 MED ORDER — SODIUM CHLORIDE 0.9 % IV SOLN: INTRAVENOUS | Status: DC

## 2014-11-26 MED ORDER — METOPROLOL TARTRATE 1 MG/ML IV SOLN: 2.5000 mg | INTRAVENOUS | Status: DC | PRN

## 2014-11-26 MED ORDER — ACETAMINOPHEN 160 MG/5ML PO SOLN: 650.0000 mg | Freq: Once | ORAL | Status: AC

## 2014-11-26 MED ORDER — NEOSTIGMINE METHYLSULFATE 10 MG/10ML IV SOLN
INTRAVENOUS | Status: AC
  Filled 2014-11-26: qty 1

## 2014-11-26 MED ORDER — FENTANYL CITRATE (PF) 100 MCG/2ML IJ SOLN
INTRAMUSCULAR | Status: AC
  Administered 2014-11-26 (×2): 50 ug via INTRAVENOUS
  Filled 2014-11-26: qty 2

## 2014-11-26 MED ORDER — SODIUM CHLORIDE 0.9 % IJ SOLN
3.0000 mL | Freq: Two times a day (BID) | INTRAMUSCULAR | Status: DC
  Administered 2014-11-27 – 2014-11-28 (×4): 3 mL via INTRAVENOUS

## 2014-11-26 MED ORDER — MORPHINE SULFATE 2 MG/ML IJ SOLN: 1.0000 mg | INTRAMUSCULAR | Status: DC | PRN

## 2014-11-26 MED ORDER — SODIUM CHLORIDE 0.9 % IV SOLN: Freq: Once | INTRAVENOUS | Status: DC

## 2014-11-26 MED ORDER — SODIUM CHLORIDE 0.9 % IV SOLN
1.0000 mL/kg/h | INTRAVENOUS | Status: AC
  Administered 2014-11-26: 1 mL/kg/h via INTRAVENOUS

## 2014-11-26 MED ORDER — TRAMADOL HCL 50 MG PO TABS
50.0000 mg | ORAL_TABLET | ORAL | Status: DC | PRN
Start: 2014-11-26 — End: 2014-11-29

## 2014-11-26 MED ORDER — GLYCOPYRROLATE 0.2 MG/ML IJ SOLN
INTRAMUSCULAR | Status: DC | PRN
  Administered 2014-11-26: 0.4 mg via INTRAVENOUS

## 2014-11-26 MED ORDER — PROPOFOL 10 MG/ML IV BOLUS
INTRAVENOUS | Status: DC | PRN
  Administered 2014-11-26: 60 mg via INTRAVENOUS

## 2014-11-26 MED ORDER — PHENYLEPHRINE HCL 10 MG/ML IJ SOLN
INTRAMUSCULAR | Status: DC | PRN
  Administered 2014-11-26: 80 ug via INTRAVENOUS

## 2014-11-26 MED ORDER — VANCOMYCIN HCL IN DEXTROSE 1-5 GM/200ML-% IV SOLN
1000.0000 mg | Freq: Once | INTRAVENOUS | Status: AC
  Administered 2014-11-27: 1000 mg via INTRAVENOUS
  Filled 2014-11-26: qty 200

## 2014-11-26 MED ORDER — PROTAMINE SULFATE 10 MG/ML IV SOLN
INTRAVENOUS | Status: DC | PRN
  Administered 2014-11-26: 30 mg via INTRAVENOUS

## 2014-11-26 MED ORDER — DEXTROSE 5 % IV SOLN
1.5000 g | Freq: Two times a day (BID) | INTRAVENOUS | Status: AC
  Administered 2014-11-26 – 2014-11-28 (×4): 1.5 g via INTRAVENOUS
  Filled 2014-11-26 (×5): qty 1.5

## 2014-11-26 MED ORDER — ONDANSETRON HCL 4 MG/2ML IJ SOLN
INTRAMUSCULAR | Status: AC
  Filled 2014-11-26: qty 2

## 2014-11-26 MED ORDER — GLYCOPYRROLATE 0.2 MG/ML IJ SOLN
INTRAMUSCULAR | Status: AC
  Filled 2014-11-26: qty 2

## 2014-11-26 MED ORDER — CLOPIDOGREL BISULFATE 75 MG PO TABS
75.0000 mg | ORAL_TABLET | Freq: Every day | ORAL | Status: DC
  Administered 2014-11-27 – 2014-11-29 (×3): 75 mg via ORAL
  Filled 2014-11-26 (×4): qty 1

## 2014-11-26 MED ORDER — OXYCODONE HCL 5 MG PO TABS: 5.0000 mg | ORAL_TABLET | ORAL | Status: DC | PRN

## 2014-11-26 MED ORDER — SODIUM CHLORIDE 0.9 % IR SOLN
Status: DC | PRN
  Administered 2014-11-26: 500 mL

## 2014-11-26 MED ORDER — ASPIRIN EC 81 MG PO TBEC
81.0000 mg | DELAYED_RELEASE_TABLET | Freq: Every day | ORAL | Status: DC
  Administered 2014-11-27 – 2014-11-29 (×4): 81 mg via ORAL
  Filled 2014-11-26 (×4): qty 1

## 2014-11-26 MED ORDER — ONDANSETRON HCL 4 MG/2ML IJ SOLN
4.0000 mg | Freq: Four times a day (QID) | INTRAMUSCULAR | Status: DC | PRN
  Administered 2014-11-27: 4 mg via INTRAVENOUS
  Filled 2014-11-26: qty 2

## 2014-11-26 MED ORDER — FAMOTIDINE 20 MG PO TABS
20.0000 mg | ORAL_TABLET | Freq: Two times a day (BID) | ORAL | Status: DC | PRN
  Filled 2014-11-26: qty 1

## 2014-11-26 MED ORDER — METOPROLOL TARTRATE 25 MG/10 ML ORAL SUSPENSION
12.5000 mg | Freq: Two times a day (BID) | ORAL | Status: DC
  Filled 2014-11-26: qty 5

## 2014-11-26 MED ORDER — FAMOTIDINE IN NACL 20-0.9 MG/50ML-% IV SOLN
20.0000 mg | Freq: Two times a day (BID) | INTRAVENOUS | Status: AC
  Administered 2014-11-26 – 2014-11-27 (×2): 20 mg via INTRAVENOUS
  Filled 2014-11-26 (×2): qty 50

## 2014-11-26 MED ORDER — LACTATED RINGERS IV SOLN: 500.0000 mL | Freq: Once | INTRAVENOUS | Status: AC | PRN

## 2014-11-26 MED ORDER — SODIUM CHLORIDE 0.9 % IJ SOLN: 3.0000 mL | INTRAMUSCULAR | Status: DC | PRN

## 2014-11-26 MED ORDER — METOPROLOL TARTRATE 12.5 MG HALF TABLET
ORAL_TABLET | ORAL | Status: AC
  Administered 2014-11-26: 25 mg via ORAL
  Filled 2014-11-26: qty 1

## 2014-11-26 MED ORDER — INSULIN ASPART 100 UNIT/ML ~~LOC~~ SOLN
0.0000 [IU] | Freq: Three times a day (TID) | SUBCUTANEOUS | Status: DC
  Administered 2014-11-26 – 2014-11-27 (×2): 2 [IU] via SUBCUTANEOUS
  Administered 2014-11-27: 3 [IU] via SUBCUTANEOUS

## 2014-11-26 MED ORDER — CHLORHEXIDINE GLUCONATE 4 % EX LIQD
60.0000 mL | Freq: Once | CUTANEOUS | Status: DC
Start: 2014-11-26 — End: 2014-11-26

## 2014-11-26 MED ORDER — NEOSTIGMINE METHYLSULFATE 10 MG/10ML IV SOLN
INTRAVENOUS | Status: DC | PRN
  Administered 2014-11-26: 3 mg via INTRAVENOUS

## 2014-11-26 MED ORDER — MIDAZOLAM HCL 2 MG/2ML IJ SOLN: INTRAMUSCULAR | Status: DC | PRN

## 2014-11-26 MED ORDER — MIDAZOLAM HCL 2 MG/2ML IJ SOLN: 2.0000 mg | INTRAMUSCULAR | Status: DC | PRN

## 2014-11-26 MED ORDER — SODIUM CHLORIDE 0.9 % IV SOLN: 250.0000 mL | INTRAVENOUS | Status: DC | PRN

## 2014-11-26 MED ORDER — ROCURONIUM BROMIDE 100 MG/10ML IV SOLN
INTRAVENOUS | Status: DC | PRN
  Administered 2014-11-26: 40 mg via INTRAVENOUS

## 2014-11-26 MED ORDER — NITROGLYCERIN IN D5W 200-5 MCG/ML-% IV SOLN: 0.0000 ug/min | INTRAVENOUS | Status: DC

## 2014-11-26 MED ORDER — EPINEPHRINE HCL 0.1 MG/ML IJ SOSY
PREFILLED_SYRINGE | INTRAMUSCULAR | Status: DC | PRN
  Administered 2014-11-26: 50 ug via INTRAVENOUS

## 2014-11-26 MED ORDER — METOPROLOL TARTRATE 25 MG PO TABS
25.0000 mg | ORAL_TABLET | Freq: Once | ORAL | Status: AC
  Administered 2014-11-26: 25 mg via ORAL
  Filled 2014-11-26: qty 1

## 2014-11-26 SURGICAL SUPPLY — 106 items
ATTRACTOMAT 16X20 MAGNETIC DRP (DRAPES) IMPLANT
BAG BANDED W/RUBBER/TAPE 36X54 (MISCELLANEOUS) ×4 IMPLANT
BAG DECANTER FOR FLEXI CONT (MISCELLANEOUS) IMPLANT
BAG SNAP BAND KOVER 36X36 (MISCELLANEOUS) ×8 IMPLANT
BLADE 10 SAFETY STRL DISP (BLADE) ×4 IMPLANT
BLADE STERNUM SYSTEM 6 (BLADE) ×4 IMPLANT
BLADE SURG ROTATE 9660 (MISCELLANEOUS) ×4 IMPLANT
CABLE PACING FASLOC BIEGE (MISCELLANEOUS) ×4 IMPLANT
CABLE PACING FASLOC BLUE (MISCELLANEOUS) IMPLANT
CANISTER SUCTION 2500CC (MISCELLANEOUS) IMPLANT
CANNULA FEM VENOUS REMOTE 22FR (CANNULA) IMPLANT
CANNULA OPTISITE PERFUSION 16F (CANNULA) IMPLANT
CANNULA OPTISITE PERFUSION 18F (CANNULA) IMPLANT
CATH CROSS OVER TEMPO 5F (CATHETERS) ×4 IMPLANT
CATH DIAG EXPO 6F VENT PIG 145 (CATHETERS) ×8 IMPLANT
CATH EXPO 5FR AL1 (CATHETERS) ×4 IMPLANT
CATH S G BIP PACING (SET/KITS/TRAYS/PACK) ×8 IMPLANT
CATH STRAIGHT 5FR 65CM (CATHETERS) ×4 IMPLANT
CLIP TI MEDIUM 24 (CLIP) ×4 IMPLANT
CLIP TI WIDE RED SMALL 24 (CLIP) ×4 IMPLANT
CONT SPEC 4OZ CLIKSEAL STRL BL (MISCELLANEOUS) ×12 IMPLANT
COVER DOME SNAP 22 D (MISCELLANEOUS) ×4 IMPLANT
COVER MAYO STAND STRL (DRAPES) ×12 IMPLANT
COVER TABLE BACK 60X90 (DRAPES) ×4 IMPLANT
CRADLE DONUT ADULT HEAD (MISCELLANEOUS) ×4 IMPLANT
DERMABOND ADVANCED (GAUZE/BANDAGES/DRESSINGS) ×2
DERMABOND ADVANCED .7 DNX12 (GAUZE/BANDAGES/DRESSINGS) ×2 IMPLANT
DRAPE INCISE IOBAN 66X45 STRL (DRAPES) IMPLANT
DRAPE PROXIMA HALF (DRAPES) ×4 IMPLANT
DRAPE SLUSH MACHINE 52X66 (DRAPES) ×4 IMPLANT
DRAPE SURG IRRIG POUCH 19X23 (DRAPES) ×4 IMPLANT
DRAPE TABLE COVER HEAVY DUTY (DRAPES) ×4 IMPLANT
DRSG TEGADERM 4X4.75 (GAUZE/BANDAGES/DRESSINGS) ×4 IMPLANT
ELECT REM PT RETURN 9FT ADLT (ELECTROSURGICAL) ×8
ELECTRODE REM PT RTRN 9FT ADLT (ELECTROSURGICAL) ×4 IMPLANT
FELT TEFLON 6X6 (MISCELLANEOUS) ×4 IMPLANT
FEMORAL VENOUS CANN RAP (CANNULA) IMPLANT
GAUZE SPONGE 4X4 12PLY STRL (GAUZE/BANDAGES/DRESSINGS) ×4 IMPLANT
GLOVE BIO SURGEON STRL SZ 6.5 (GLOVE) ×9 IMPLANT
GLOVE BIO SURGEONS STRL SZ 6.5 (GLOVE) ×3
GLOVE BIOGEL PI IND STRL 6.5 (GLOVE) ×8 IMPLANT
GLOVE BIOGEL PI INDICATOR 6.5 (GLOVE) ×8
GLOVE ECLIPSE 7.5 STRL STRAW (GLOVE) ×12 IMPLANT
GLOVE ECLIPSE 8.0 STRL XLNG CF (GLOVE) ×4 IMPLANT
GLOVE EUDERMIC 7 POWDERFREE (GLOVE) ×4 IMPLANT
GLOVE ORTHO TXT STRL SZ7.5 (GLOVE) IMPLANT
GOWN STRL REUS W/ TWL LRG LVL3 (GOWN DISPOSABLE) ×4 IMPLANT
GOWN STRL REUS W/ TWL XL LVL3 (GOWN DISPOSABLE) ×12 IMPLANT
GOWN STRL REUS W/TWL LRG LVL3 (GOWN DISPOSABLE) ×4
GOWN STRL REUS W/TWL XL LVL3 (GOWN DISPOSABLE) ×12
GUIDEWIRE SAF TJ AMPL .035X180 (WIRE) ×4 IMPLANT
GUIDEWIRE SAFE TJ AMPLATZ EXST (WIRE) ×4 IMPLANT
GUIDEWIRE STRAIGHT .035 260CM (WIRE) ×4 IMPLANT
INSERT FOGARTY 61MM (MISCELLANEOUS) ×4 IMPLANT
INSERT FOGARTY SM (MISCELLANEOUS) ×8 IMPLANT
INSERT FOGARTY XLG (MISCELLANEOUS) IMPLANT
KIT BASIN OR (CUSTOM PROCEDURE TRAY) ×4 IMPLANT
KIT DILATOR VASC 18G NDL (KITS) IMPLANT
KIT HEART LEFT (KITS) ×4 IMPLANT
KIT ROOM TURNOVER OR (KITS) ×4 IMPLANT
KIT SUCTION CATH 14FR (SUCTIONS) ×4 IMPLANT
LOOP VESSEL MAXI BLUE (MISCELLANEOUS) ×4 IMPLANT
NEEDLE PERC 18GX7CM (NEEDLE) ×4 IMPLANT
NS IRRIG 1000ML POUR BTL (IV SOLUTION) ×12 IMPLANT
PACK AORTA (CUSTOM PROCEDURE TRAY) ×4 IMPLANT
PAD ARMBOARD 7.5X6 YLW CONV (MISCELLANEOUS) ×8 IMPLANT
PAD ELECT DEFIB RADIOL ZOLL (MISCELLANEOUS) ×4 IMPLANT
PATCH TACHOSII LRG 9.5X4.8 (VASCULAR PRODUCTS) IMPLANT
SHEATH PINNACLE 6F 10CM (SHEATH) ×8 IMPLANT
SPONGE LAP 4X18 X RAY DECT (DISPOSABLE) ×4 IMPLANT
STOPCOCK 4 WAY LG BORE MALE ST (IV SETS) ×4 IMPLANT
STOPCOCK MORSE 400PSI 3WAY (MISCELLANEOUS) ×12 IMPLANT
SUT ETHIBOND X763 2 0 SH 1 (SUTURE) ×4 IMPLANT
SUT GORETEX CV 4 TH 22 36 (SUTURE) ×8 IMPLANT
SUT GORETEX CV4 TH-18 (SUTURE) ×12 IMPLANT
SUT GORETEX TH-18 36 INCH (SUTURE) ×12 IMPLANT
SUT MNCRL AB 3-0 PS2 18 (SUTURE) IMPLANT
SUT PROLENE 3 0 SH1 36 (SUTURE) IMPLANT
SUT PROLENE 4 0 RB 1 (SUTURE)
SUT PROLENE 4-0 RB1 .5 CRCL 36 (SUTURE) IMPLANT
SUT PROLENE 5 0 C 1 36 (SUTURE) ×8 IMPLANT
SUT PROLENE 6 0 BV (SUTURE) ×4 IMPLANT
SUT PROLENE 6 0 C 1 30 (SUTURE) ×12 IMPLANT
SUT SILK  1 MH (SUTURE) ×2
SUT SILK 1 MH (SUTURE) ×2 IMPLANT
SUT SILK 2 0 SH CR/8 (SUTURE) IMPLANT
SUT VIC AB 2-0 CT1 27 (SUTURE) ×4
SUT VIC AB 2-0 CT1 27XBRD (SUTURE) ×2 IMPLANT
SUT VIC AB 2-0 CT1 TAPERPNT 27 (SUTURE) ×2 IMPLANT
SUT VIC AB 2-0 CTX 36 (SUTURE) IMPLANT
SUT VIC AB 3-0 SH 8-18 (SUTURE) IMPLANT
SYR 20CC LL (SYRINGE) ×4 IMPLANT
SYR 30ML LL (SYRINGE) ×12 IMPLANT
SYR 50ML LL SCALE MARK (SYRINGE) ×4 IMPLANT
SYR MEDRAD MARK V 150ML (SYRINGE) ×4 IMPLANT
SYRINGE 10CC LL (SYRINGE) ×4 IMPLANT
TOWEL OR 17X26 10 PK STRL BLUE (TOWEL DISPOSABLE) ×8 IMPLANT
TRANSDUCER W/STOPCOCK (MISCELLANEOUS) ×8 IMPLANT
TRAY FOLEY IC TEMP SENS 14FR (CATHETERS) ×4 IMPLANT
TUBE SUCT INTRACARD DLP 20F (MISCELLANEOUS) IMPLANT
TUBING ART PRESS 72  MALE/FEM (TUBING) ×2
TUBING ART PRESS 72 MALE/FEM (TUBING) ×2 IMPLANT
TUBING HIGH PRESSURE 120CM (CONNECTOR) ×4 IMPLANT
VALVE HEART TRANSCATH SZ3 23MM (Prosthesis & Implant Heart) ×4 IMPLANT
WIRE AMPLATZ SS-J .035X180CM (WIRE) ×4 IMPLANT
WIRE J 3MM .035X145CM (WIRE) ×4 IMPLANT

## 2014-11-26 NOTE — Transfer of Care (Signed)
Immediate Anesthesia Transfer of Care Note  Patient: Barry BrunnerBeatrice S Kellen  Procedure(s) Performed: Procedure(s): TRANSCATHETER AORTIC VALVE REPLACEMENT, TRANSFEMORAL (N/A) TRANSESOPHAGEAL ECHOCARDIOGRAM (TEE) (N/A)  Patient Location: PACU  Anesthesia Type:General  Level of Consciousness: awake, alert  and patient cooperative  Airway & Oxygen Therapy: Patient Spontanous Breathing and Patient connected to nasal cannula oxygen  Post-op Assessment: Report given to RN, Post -op Vital signs reviewed and stable and Patient moving all extremities  Post vital signs: Reviewed and stable  Last Vitals:  Filed Vitals:   11/26/14 1123  BP:   Pulse:   Resp: 18    Complications: No apparent anesthesia complications

## 2014-11-26 NOTE — Op Note (Signed)
CARDIOTHORACIC SURGERY OPERATIVE NOTE  Date of Procedure:  11/26/2014  Preoperative Diagnosis: Severe Aortic Stenosis   Postoperative Diagnosis: Same   Procedure:    Transcatheter Aortic Valve Replacement - Left Transfemoral Approach  Edwards Sapien 3 Transcatheter Heart Valve (size 23 mm, model # 9600TFX, serial # [id number])   Co-Surgeons:  Alleen Borne, MD and Tonny Bollman, MD     Anesthesiologist:  Kipp Brood, MD  Echocardiographer:  Charlton Haws, MD  Pre-operative Echo Findings:   severe aortic stenosis  mild left ventricular systolic dysfunction  3+ MR  Post-operative Echo Findings:  No paravalvular leak  Improved left ventricular systolic function  Unchanged 3+ MR     DETAILS OF THE OPERATIVE PROCEDURE  The majority of the procedure is documented separately in a procedure note by Dr. Excell Seltzer.   TRANSFEMORAL ACCESS:   A small incision is made in the left groin immediately over the common femoral artery. The subcutaneous tissues are divided with electrocautery and the anterior surface of the common femoral artery is identified. The largest portion of the artery appeared to be just proximal to the bifurcation of the SFA and Profunda but the SFA and CFA appeared small. We felt that it was large enough to allow use of a 29F sheath. Sharp dissection is utilized to free up the artery proximally and distally and the vessel is encircled with a vessel loop.  A pair of CV-4 Gore-tex sutures are place as diamond-shaped purse-strings on the anterior surface of the femoral artery.  The patient is heparinized systemically and ACT verified > 250 seconds.  The common femoral artery is punctured using an  18 gauge needle and a soft J-tipped guidewire is passed into the common iliac artery under fluoroscopic guidance.  A 6 Fr straight diagnostic catheter is placed over the guidewire and the guidewire is removed.  An Amplatz super stiff guidewire is passed through the sheath into  the descending thoracic aorta and the introducing diagnostic catheter is removed.  Serial dilators are passed over the guidewire under continuous fluoroscopic guidance, making certain that each dilator passes easily all of the way into the distal abdominal aorta.  A 14 Fr Edwards E- sheath is passed over the guidewire into the abdominal aorta.  The introducing dilator is removed, the sheath is flushed with heparinized saline, and the sheath is secured to the skin.    FEMORAL SHEATH REMOVAL AND ARTERIAL CLOSURE:  After the completion of successful valve deployment as documented separately by Dr. Excell Seltzer, the femoral artery sheath is removed and the arteriotomy is closed using the previously placed Gore-tex purse-string sutures.  A digitally-subtracted arteriogram is obtained from just above the aortic bifurcation to below the arteriotomy to confirm the integrity of the vascular repair.  The superficial femoral artery was occluded at its origin and I felt that this was most likely due to a dissection from the sheath in this small artery. Then the CFA, PFA, and SFA were controlled with tapes and vascular clamps. The arteriotomy site was opened by removing the sutures. There was a dissection flap at the distal end of the arteriotomy that was occluding the SFA. The patient was still heparinized and there was no clot present on back bleeding of the vessels. Then the arteriotomy was closed transversely using 6-0 prolene continuous suture starting from both ends taking care to tack up the dissection flap under direct vision. The clamps and tapes were removed and there was a good pulse distal to the repair.  A digitally-subtracted  arteriogram showed an intact arteriotomy repair with good flow down the SFA and Profunda. Protamine was given. The incision is irrigated with saline solution and subsequently closed in multiple layers using absorbable suture.  The skin incision is closed using a subcuticular skin  closure.     Alleen BorneBryan K. Cebastian Neis MD 11/26/2014

## 2014-11-26 NOTE — Progress Notes (Signed)
Anesthesiology Note:  In OR Holding Area attempted pulmonary catheter insertion. Upon advancement of catheter into RA, patient developed transient complete heart block lasting approximately 10-15 seconds catheter removed, NSR returned. No chest compressions started.  The decision was made to advance the PA catheter in OR after induction of anesthesia with external pacing pads  attached. PA catheter advanced without difficulty on first attempt in OR while the patient was under general anesthesia.    Kara Santiago Broodavid Aspynn Clover

## 2014-11-26 NOTE — Anesthesia Procedure Notes (Signed)
Procedure Name: Intubation Date/Time: 11/26/2014 12:57 PM Performed by: Ferol LuzMCMILLEN, Ruie Sendejo L Pre-anesthesia Checklist: Patient identified, Emergency Drugs available, Suction available, Patient being monitored and Timeout performed Patient Re-evaluated:Patient Re-evaluated prior to inductionOxygen Delivery Method: Circle system utilized Preoxygenation: Pre-oxygenation with 100% oxygen Intubation Type: IV induction Ventilation: Mask ventilation without difficulty Laryngoscope Size: Mac and 3 Grade View: Grade I Tube type: Oral Tube size: 7.0 mm Number of attempts: 1 Airway Equipment and Method: Stylet Secured at: 19 cm Tube secured with: Tape Dental Injury: Teeth and Oropharynx as per pre-operative assessment

## 2014-11-26 NOTE — H&P (Signed)
[address]       [place] [address]             [phone]      Cardiothoracic Surgery History and Physical   Referring Provider is Tonny Bollman, MD PCP is Stefanie Libel, MD  Chief Complaint  Patient presents with  . Aortic Stenosis        HPI:  The patient is an 81 year old woman with a history of coronary artery disease s/p DES to the LAD and LCX in 03/2007 and moderate to severe aortic stenosis, mild AI and moderate MR by echo in 02/2014. The mean aortic valve gradient was 32 mm Hg and the peak gradient was 51 mm Hg. The dimensionless index was 0.21. The LVEF was 50-55%. She now presents with a 2 week history of dyspnea with mild exertion like walking to her car. She has also had some burning chest discomfort at the same time. She was seen by cardiology and underwent cardiac cath on 10/07/2014 showing patent stents in the LAD and LCX with mild nonobstructive CAD and an EF of 50% with moderate MR. The mean gradient was measured at 23 mm Hg and the peak to peak gradient was 35 mm Hg with a calculated AVA of 0.64 cm2.  She is widowed and lives alone but is currently living with her daughter Kara [place] who is a Engineer, civil (consulting) in the PACU at [facility] [employer]. She has been quite active until the past couple weeks.  Past Medical History  Diagnosis Date  . Aortic stenosis   . CAD (coronary artery disease)   . CVD (cerebrovascular disease)   . Hyperlipidemia   . Hypertension   . PVD (peripheral vascular disease)   . Mitral regurgitation   . Aortic insufficiency   . Carotid artery occlusion   . Diabetes mellitus type 2, controlled     Past Surgical History  Procedure Laterality Date  . Tympanomastoidectomy  2002    right  . Cardiac catheterization  04/07/07    By Dr. Excell Seltzer -- 1. Severe LAD stenosis with successful PCI using a drug-eluting stent. 2. Severe left circumflex stenosis with successful PCI using a  single drug-eluting stent.  . Stents  2008  . Cardiac catheterization N/A 10/07/2014    Procedure: Right/Left Heart Cath and Coronary Angiography; Surgeon: Tonny Bollman, MD; Location: [facility] INVASIVE CV LAB; Service: Cardiovascular; Laterality: N/A;    Family History  Problem Relation Age of Onset  . Pneumonia    . Heart disease Mother   . Heart disease Sister   . Heart disease Brother   . Heart disease Son   . Heart attack Mother   . Heart attack Sister   . Heart attack Son   . Stroke Maternal Uncle   . Heart attack Brother     History   Social History  . Marital Status: Widowed    Spouse Name: N/A  . Number of Children: N/A  . Years of Education: N/A   Occupational History  . 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 Smoking status: Former Smoker -- 0.40 packs/day for 5 years    Types: Cigarettes    Quit date: 05/24/2006  . Smokeless tobacco: Never Used  . Alcohol Use: Yes  . Drug Use: No  . Sexual Activity: Not on file   Other Topics Concern  . Not on file   Social History Narrative    Current  Outpatient Prescriptions  Medication Sig Dispense Refill  . aspirin 81 MG tablet Take 81 mg by mouth daily.     Marland Kitchen atorvastatin (LIPITOR) 80 MG tablet TAKE 1 TABLET (80 MG TOTAL) BY MOUTH DAILY. 90 tablet 2  . glipiZIDE (GLUCOTROL) 5 MG tablet Take 5 mg by mouth daily.     Marland Kitchen ibuprofen (ADVIL,MOTRIN) 200 MG tablet Take 200 mg by mouth every 6 (six) hours as needed for mild pain or moderate pain.    Marland Kitchen lisinopril (PRINIVIL,ZESTRIL) 40 MG tablet Take 1 tablet (40 mg total) by mouth daily. 90 tablet 2  . metoprolol (LOPRESSOR) 25 MG tablet Take 1 tablet (25 mg total) by mouth 2 (two) times daily. 180 tablet 3   No current facility-administered medications for this visit.    No Known  Allergies    Review of Systems:  General:normal appetite, decreased energy, no weight gain, no weight loss, no fever Cardiac:has chest pain with exertion, no chest pain at rest, has SOB with mild exertion, no resting SOB, no PND, no orthopnea, no palpitations, no arrhythmia, no atrial fibrillation, no LE edema, no dizzy spells, no syncope Respiratory:has shortness of breath, no home oxygen, no productive cough, no dry cough, no bronchitis, no wheezing, no hemoptysis, no asthma, no pain with inspiration or cough, no sleep apnea, no CPAP at night GI:no difficulty swallowing, no reflux, no frequent heartburn, no hiatal hernia, no abdominal pain, no constipation, no diarrhea, no hematochezia, no hematemesis, no melena GU:no dysuria, no frequency, no urinary tract infection, no hematuria, no kidney stones, no kidney disease Vascular:no pain suggestive of claudication, no pain in feet, no leg cramps, has varicose veins, no DVT, no non-healing foot ulcer Neuro:no stroke, no TIA's, no seizures, no headaches, notemporary blindness one eye, no slurred speech, no peripheral neuropathy, no chronic pain, no instability of gait, no memory/cognitive dysfunction Musculoskeletal:no arthritis, no joint swelling, no myalgias, no difficulty walking, no mobility  Skin:no rash, no itching, no skin infections, no pressure sores or ulcerations Psych:no anxiety, no depression, no nervousness, no unusual recent stress Eyes:no blurry vision, no floaters, no recent vision changes, no wears glasses or  contacts ENT:no hearing loss, no loose or painful teeth, partial dentures, last saw dentist 2 years ago. No mouth pain or painful teeth. Hematologic:no easy bruising, no abnormal bleeding, no clotting disorder, no frequent epistaxis Endocrine:no diabetes, does not check CBG's at home       Physical Exam:  BP 147/76 mmHg  Pulse 63  Resp 20  Ht 5' (1.524 m)  Wt 109 lb (49.442 kg)  BMI 21.29 kg/m2  SpO2 96% General:Elderly, well-appearing HEENT:Unremarkable , NCAT, PERLA, EOMI, oropharynx clear, teeth in fair condition, partial denture,  Neck:no JVD, no bruits, no adenopathy or thyromegaly Chest:clear to auscultation, symmetrical breath sounds, no wheezes, no rhonchi  CV:RRR, grade III/VI crescendo/decrescendo murmur heard best at RUSB, no diastolic murmur Abdomen:soft, non-tender, no masses or organomegaly Extremities:warm, well-perfused, pulses in feet are palpable, no LE edema Rectal/GUDeferred Neuro:Grossly non-focal and symmetrical throughout Skin:Clean and dry, no rashes, no breakdown   Diagnostic Tests:  *Cardiovascular Imaging at [facility]         [address]            [place], [place] [address]              [phone]  ------------------------------------------------------------------- Transthoracic Echocardiography  Patient:  Tyese, Finken MR #:    [id number] Study Date: 03/04/2014 Gender:    F Age:  88 Height:   152.4 cm Weight:   49.4 kg BSA:    1.45 m^2 Pt. Status: Room:  ORDERING   Olga Millers REFERRING  Arlys John Crenshaw ATTENDING  Thurmon Fair, MD SONOGRAPHER Clearence Ped, RCS PERFORMING  [facility], Outpatient  cc:  ------------------------------------------------------------------- LV EF: 50% -  55%  ------------------------------------------------------------------- Indications:   424.1 Aortic valve disorders.  ------------------------------------------------------------------- History:  PMH: HTN, PVD, CVD. Coronary artery disease. Risk factors: Diabetes mellitus.  ------------------------------------------------------------------- Study Conclusions  - Left ventricle: The cavity size was normal. There was mild focal basal hypertrophy of the septum. Systolic function was normal. The estimated ejection fraction was in the range of 50% to 55%. There is akinesis of the basal-midinferolateral myocardium. Doppler parameters are consistent with abnormal left ventricular relaxation (grade 1 diastolic dysfunction). Doppler parameters are consistent with high ventricular filling pressure. - Aortic valve: Valve mobility was restricted. There was moderate to severe stenosis. There was mild regurgitation. Valve area (VTI): 0.67 cm^2. Valve area (Vmax): 0.64 cm^2. Valve area (Vmean): 0.67 cm^2. - Mitral valve: There was moderate regurgitation. - Left atrium: The atrium was mildly dilated. - Pulmonary arteries: Systolic pressure was mildly increased.  Impressions:  - Basal/mid inferolateral akinesis with overall preserved LV function; grade 1 diastolic dysfunction; mild LAE; moderate MR; calcified aortic valve with moderate to severe AS (moderate by mean gradient; severe by continuity equation); mildly elevated pulmonary pressure.  Transthoracic echocardiography. M-mode, complete 2D,  spectral Doppler, and color Doppler. Birthdate: Patient birthdate: 1926/03/22. Age: Patient is 81 yr old. Sex: Gender: female. BMI: 21.3 kg/m^2. Blood pressure:   168/69 Patient status: Outpatient. Study date: Study date: 03/04/2014. Study time: 08:57 AM. Location: Echo laboratory.  -------------------------------------------------------------------  ------------------------------------------------------------------- Left ventricle: The cavity size was normal. There was mild focal basal hypertrophy of the septum. Systolic function was normal. The estimated ejection fraction was in the range of 50% to 55%. Regional wall motion abnormalities:  There is akinesis of the basal-midinferolateral myocardium. Doppler parameters are consistent with abnormal left ventricular relaxation (grade 1 diastolic dysfunction). Doppler parameters are consistent with high ventricular filling pressure.  ------------------------------------------------------------------- Aortic valve:  Trileaflet; severely calcified leaflets. Valve mobility was restricted. Doppler:  There was moderate to severe stenosis.  There was mild regurgitation.  VTI ratio of LVOT to aortic valve: 0.21. Valve area (VTI): 0.67 cm^2. Indexed valve area (VTI): 0.46 cm^2/m^2. Peak velocity ratio of LVOT to aortic valve: 0.21. Valve area (Vmax): 0.64 cm^2. Indexed valve area (Vmax): 0.44 cm^2/m^2. Mean velocity ratio of LVOT to aortic valve: 0.21. Valve area (Vmean): 0.67 cm^2. Indexed valve area (Vmean): 0.46 cm^2/m^2.  Mean gradient (S): 32 mm Hg. Peak gradient (S): 51 mm Hg.  ------------------------------------------------------------------- Aorta: Aortic root: The aortic root was normal in size.  ------------------------------------------------------------------- Mitral valve:  Structurally normal valve.  Mobility was not restricted. Doppler: Transvalvular velocity was within the normal range. There was  no evidence for stenosis. There was moderate regurgitation.  Peak gradient (D): 2 mm Hg.  ------------------------------------------------------------------- Left atrium: LA Volume/ BSA = 37.5 ml/m2. The atrium was mildly dilated.  ------------------------------------------------------------------- Right ventricle: The cavity size was normal. Systolic function was normal.  ------------------------------------------------------------------- Pulmonic valve:  Doppler: Transvalvular velocity was within the normal range. There was no evidence for stenosis. There was trivial regurgitation.  ------------------------------------------------------------------- Tricuspid valve:  Structurally normal valve.  Doppler: Transvalvular velocity was within the normal range. There was mild regurgitation.  ------------------------------------------------------------------- Pulmonary artery:  Systolic pressure was mildly increased.  ------------------------------------------------------------------- Right atrium: The atrium was normal in size.  -------------------------------------------------------------------  Pericardium: There was no pericardial effusion.  ------------------------------------------------------------------- Systemic veins: Inferior vena cava: The vessel was normal in size. The respirophasic diameter changes were in the normal range (= 50%), consistent with normal central venous pressure. Diameter: 15 mm.  ------------------------------------------------------------------- Measurements  IVC                    Value     Reference ID                    15  mm    ---------  Left ventricle              Value     Reference LV ID, ED, PLAX chordal          49.1 mm    43 - 52 LV ID, ES, PLAX chordal          36.2 mm    23 - 38 LV fx shortening, PLAX chordal  (L)    26  %    >=29 LV PW thickness, ED            9.7  mm    --------- IVS/LV PW ratio, ED        (H)   1.4      <=1.3 Stroke volume, 2D             68  ml    --------- Stroke volume/bsa, 2D           47  ml/m^2  --------- LV e&', lateral              5.7  cm/s   --------- LV E/e&', lateral             12.65     --------- LV e&', medial               4.5  cm/s   --------- LV E/e&', medial              16.02     --------- LV e&', average              5.1  cm/s   --------- LV E/e&', average             14.14     ---------  Ventricular septum            Value     Reference IVS thickness, ED             13.6 mm    ---------  LVOT                   Value     Reference LVOT ID, S                20  mm    --------- LVOT area                 3.14 cm^2   --------- LVOT peak velocity, S           73.3 cm/s   --------- LVOT mean velocity, S           53.6 cm/s   --------- LVOT VTI, S                21.5 cm    ---------  Aortic valve               Value     Reference Aortic valve peak velocity, S  357  cm/s   --------- Aortic valve mean velocity, S       253  cm/s   --------- Aortic valve VTI, S            101  cm    --------- Aortic mean gradient, S          32  mm Hg  --------- Aortic peak gradient, S          51  mm Hg  --------- VTI ratio, LVOT/AV            0.21      --------- Aortic valve area, VTI          0.67 cm^2   --------- Aortic valve area/bsa, VTI        0.46 cm^2/m^2 --------- Velocity ratio, peak, LVOT/AV       0.21       --------- Aortic valve area, peak velocity     0.64 cm^2   --------- Aortic valve area/bsa, peak        0.44 cm^2/m^2 --------- velocity Velocity ratio, mean, LVOT/AV       0.21      --------- Aortic valve area, mean velocity     0.67 cm^2   --------- Aortic valve area/bsa, mean        0.46 cm^2/m^2 --------- velocity Aortic regurg pressure half-time     670  ms    ---------  Aorta                   Value     Reference Aortic root ID, ED            31  mm    ---------  Left atrium                Value     Reference LA ID, A-P, ES              42  mm    --------- LA ID/bsa, A-P          (H)   2.9  cm/m^2  <=2.2 LA volume, ES, 1-p A4C          42  ml    --------- LA volume/bsa, ES, 1-p A4C        29  ml/m^2  --------- LA volume, ES, 1-p A2C          59  ml    --------- LA volume/bsa, ES, 1-p A2C        40.7 ml/m^2  ---------  Mitral valve               Value     Reference Mitral E-wave peak velocity        72.1 cm/s   --------- Mitral A-wave peak velocity        89.3 cm/s   --------- Mitral deceleration time     (H)   380  ms    150 - 230 Mitral peak gradient, D          2   mm Hg  --------- Mitral E/A ratio, peak          0.8      --------- Mitral regurg VTI, PISA          242  cm    --------- Mitral ERO, PISA             0.05 cm^2   --------- Mitral regurg volume, PISA  12  ml    ---------  Pulmonary arteries            Value     Reference PA pressure, S, DP            29  mm Hg  <=30  Tricuspid valve              Value     Reference Tricuspid regurg peak velocity       257  cm/s   --------- Tricuspid peak RV-RA gradient       26  mm Hg  --------- Tricuspid maximal regurg         257  cm/s   --------- velocity, PISA  Systemic veins              Value     Reference Estimated CVP               3   mm Hg  ---------  Right ventricle              Value     Reference RV pressure, S, DP            29  mm Hg  <=30 RV s&', lateral, S             10.4 cm/s   ---------  Legend: (L) and (H) mark values outside specified reference range.  ------------------------------------------------------------------- Prepared and Electronically Authenticated by  Olga Millers 2015-10-12T11:39:14     Cardiac Cath:  Coronary Findings    Dominance: Right   Left Main  The vessel is angiographically normal.     Left Anterior Descending   . Prox LAD lesion, 0% stenosed. Previously placed Prox LAD drug eluting stent is patent.   . Mid LAD lesion, 30% stenosed. calcified, diffuse . Mid-LAD is tortuous     Left Circumflex   . Prox Cx lesion, 30% stenosed.   . Mid Cx lesion, 0% stenosed. Previously placed Mid Cx drug eluting stent is patent.     Right Coronary Artery   . Prox RCA lesion, 20% stenosed.       Right Heart Pressures Hemodynamic findings consistent with aortic stenosis. LV EDP is normal. Aortic Valve mean gradient 23 mmHg, peak-to-peak 35 mm Hg, AVA 0.64    Wall Motion                 Left Heart    Left Ventricle The left ventricular size is normal. There is mild left ventricular systolic dysfunction. There are wall motion abnormalities in the left ventricle. There are segmental wall motion abnormalities in the left ventricle.   Mitral Valve There is moderate (3+) mitral regurgitation.   Aortic Valve There is severe aortic valve stenosis. The aortic valve is  calcified. There is restricted aortic valve motion.    Coronary Diagrams    Diagnostic Diagram            Hemo Data       Most Recent Value   Fick Cardiac Output  3.21 L/min   Fick Cardiac Output Index  2.23 (L/min)/BSA   Aortic Mean Gradient  23.3 mmHg   Aortic Peak Gradient  29 mmHg   RA A Wave  3 mmHg   RA V Wave  3 mmHg   RA Mean  2 mmHg   RV Systolic Pressure  26 mmHg   RV Diastolic Pressure  0 mmHg   RV EDP  2 mmHg   PA Systolic Pressure  33 mmHg   PA Diastolic Pressure  5 mmHg   PA Mean  16 mmHg   PW A Wave  7 mmHg   PW V Wave  4 mmHg   PW Mean  5 mmHg   AO Systolic Pressure  132 mmHg   AO Diastolic Pressure  49 mmHg   AO Mean  74 mmHg   LV Systolic Pressure  165 mmHg   LV Diastolic Pressure  6 mmHg   LV EDP  7 mmHg   Arterial Occlusion Pressure Extended Systolic Pressure  132 mmHg   Arterial Occlusion Pressure Extended Diastolic Pressure  49 mmHg   Arterial Occlusion Pressure Extended Mean Pressure  78 mmHg   Left Ventricular Apex Extended Systolic Pressure  167 mmHg   Left Ventricular Apex Extended Diastolic Pressure  1 mmHg   Left Ventricular Apex Extended EDP Pressure  6 mmHg    Implants    Name ID Temporary Type Supply   No information to display    Hemo Data       Most Recent Value   Fick Cardiac Output  3.21 L/min   Fick Cardiac Output Index  2.23 (L/min)/BSA   Aortic Mean Gradient  23.3 mmHg   Aortic Peak Gradient  29 mmHg   RA A Wave  3 mmHg   RA V Wave  3 mmHg   RA Mean  2 mmHg   RV Systolic Pressure  26 mmHg   RV Diastolic Pressure  0 mmHg   RV EDP  2 mmHg   PA Systolic Pressure  33 mmHg   PA Diastolic Pressure  5 mmHg   PA Mean  16 mmHg   PW A Wave  7 mmHg   PW V Wave  4 mmHg   PW  Mean  5 mmHg   AO Systolic Pressure  132 mmHg   AO Diastolic Pressure  49 mmHg   AO Mean  74 mmHg   LV Systolic Pressure  165 mmHg   LV Diastolic Pressure  6 mmHg   LV EDP  7 mmHg   Arterial Occlusion Pressure Extended Systolic Pressure  132 mmHg   Arterial Occlusion Pressure Extended Diastolic Pressure  49 mmHg   Arterial Occlusion Pressure Extended Mean Pressure  78 mmHg   Left Ventricular Apex Extended Systolic Pressure  167 mmHg   Left Ventricular Apex Extended Diastolic Pressure  1 mmHg   Left Ventricular Apex Extended EDP Pressure  6 mmHg     Conclusion    1. Patent stents in the LAD and LCx without significant restenosis 2. Mild nonobstructive CAD 3. Mild segmental contraction abnormality of the LV with preserved LVEF 50% 4. Moderate mitral regurgitation 5. Severe aortic stenosis  Without significant CAD, suspect symptoms are related to valvular heart disease. Plan to update echo and refer to TCTS for consideration of TAVR.    ADDENDUM REPORT: 10/24/2014 15:04  CLINICAL DATA: 81 year old female with severe aortic stenosis.  EXAM: Cardiac TAVR CT  TECHNIQUE: The patient was scanned on a Philips 256 scanner. A 120 kV retrospective scan was triggered in the descending thoracic aorta at 111 HU's. Gantry rotation speed was 270 msecs and collimation was .9 mm. No beta blockade or nitro were given. The 3D data set was reconstructed in 5% intervals of the R-R cycle. Systolic and diastolic phases were analyzed on a dedicated work station using MPR, MIP and VRT modes. The patient received 80 cc of contrast.  FINDINGS: Aortic Valve: Moderately thickened aortic valve  leaflets. Non-coronary cusp is severely calcified and fixed with minimal motion. Left leaflet has minimal calcification, right none, both have moderately restricted motion. There are no subvalvular calcifications.  Aorta: Normal  caliber, no dissection. Moderate diffuse calcifications predominantly in the aortic arch and descending thoracic aorta. There is significant atherosclerotic plaque in the descending aorta.  Sinotubular Junction: 24 x 22 mm  Ascending Thoracic Aorta: 28 x 27 mm  Descending Thoracic Aorta: 16 x 15 mm  Sinus of Valsalva Measurements:  Non-coronary: 25 mm  Right -coronary: 25 mm  Left -coronary: 26 mm  Coronary Artery Height above Annulus:  Left Main: 9 mm  Right Coronary: 11 mm  Virtual Basal Annulus Measurements:  Maximum/Minimum Diameter: 24 x 20 mm  Perimeter: 83 mm  Area: 370 mm2  Optimum Fluoroscopic Angle for Delivery: LAO 5 CAU 5  Other findings: Dilated pulmonary artery consistent with pulmonary hypertension.  Moderately dilated left atrium.  IMPRESSION: 1. Moderately thickened aortic valve with severely calcified and fixed non-coronary cusp and annular measurements suitable for delivery of 23 mm Edward-SAPIEN 3 TAVR valve.  2. Sufficient annulus to coronary distance.  3. Optimal fluoroscopic angle for delivery is LAO 5 CAU 5.  Tobias AlexanderKatarina Nelson   Electronically Signed  By: Tobias AlexanderKatarina Nelson  On: 10/24/2014 15:04      Study Result     EXAM: OVER-READ INTERPRETATION CT CHEST  The following report is an over-read performed by radiologist Dr. Royal Piedraaniel Entrikinof [facility] on 10/23/2014. This over-read does not include interpretation of cardiac or coronary anatomy or pathology. The coronary calcium score/coronary CTA interpretation by the cardiologist is attached.  COMPARISON: Contemporaneous CTA of the chest, abdomen and pelvis. No priors.  FINDINGS: Please refer to contemporaneous CTA of the chest, abdomen and pelvis for full description of extracardiac findings.  IMPRESSION: Extracardiac findings will be described on the contemporaneous CTA of the chest, abdomen and pelvis.  Please see that dictation for full description.  Electronically Signed: By: Trudie Reedaniel Entrikin M.D. On: 10/23/2014 10:00      Result History     CT Coronary Morp W/Cta Cor W/Score W/Ca W/Cm &/Or Wo/Cm (Order #[id number]) on 10/24/2014 - Order Result History Report          Vitals     Height Weight BMI (Calculated)    5' (1.524 m) 109 lb (49.442 kg) 21.3      Interpretation Summary     EXAM: OVER-READ INTERPRETATION CT CHEST  The following report is an over-read performed by radiologist Dr. Royal Piedraaniel [facility] on 10/23/2014. This over-read does not include interpretation of cardiac or coronary anatomy or pathology. The coronary calcium score/coronary CTA interpretation by the cardiologist is attached.  COMPARISON: Contemporaneous CTA of the chest, abdomen and pelvis. No priors.  FINDINGS: Please refer to contemporaneous CTA of the chest, abdomen and pelvis for full description of extracardiac findings.  IMPRESSION: Extracardiac findings will be described on the contemporaneous CTA of the chest, abdomen and pelvis. Please see that dictation for full description.  Electronically Signed: By: Trudie Reedaniel Entrikin M.D. On: 10/23/2014 10:00     Addendum by Provider Default, MD on Thu Oct 24, 2014 3:07 PM    ADDENDUM REPORT: 10/24/2014 15:04  CLINICAL DATA: 81 year old female with severe aortic stenosis.  EXAM: Cardiac TAVR CT  TECHNIQUE: The patient was scanned on a Philips 256 scanner. A 120 kV retrospective scan was triggered in the descending thoracic aorta at 111 HU's. Gantry rotation speed was 270 msecs and collimation was .9  mm. No beta blockade or nitro were given. The 3D data set was reconstructed in 5% intervals of the R-R cycle. Systolic and diastolic phases were analyzed on a dedicated work station using MPR, MIP and VRT modes. The patient received 80 cc of  contrast.  FINDINGS: Aortic Valve: Moderately thickened aortic valve leaflets. Non-coronary cusp is severely calcified and fixed with minimal motion. Left leaflet has minimal calcification, right none, both have moderately restricted motion. There are no subvalvular calcifications.  Aorta: Normal caliber, no dissection. Moderate diffuse calcifications predominantly in the aortic arch and descending thoracic aorta. There is significant atherosclerotic plaque in the descending aorta.  Sinotubular Junction: 24 x 22 mm  Ascending Thoracic Aorta: 28 x 27 mm  Descending Thoracic Aorta: 16 x 15 mm  Sinus of Valsalva Measurements:  Non-coronary: 25 mm  Right -coronary: 25 mm  Left -coronary: 26 mm  Coronary Artery Height above Annulus:  Left Main: 9 mm  Right Coronary: 11 mm  Virtual Basal Annulus Measurements:  Maximum/Minimum Diameter: 24 x 20 mm  Perimeter: 83 mm  Area: 370 mm2  Optimum Fluoroscopic Angle for Delivery: LAO 5 CAU 5  Other findings: Dilated pulmonary artery consistent with pulmonary hypertension.  Moderately dilated left atrium.  IMPRESSION: 1. Moderately thickened aortic valve with severely calcified and fixed non-coronary cusp and annular measurements suitable for delivery of 23 mm Edward-SAPIEN 3 TAVR valve.  2. Sufficient annulus to coronary distance.  3. Optimal fluoroscopic angle for delivery is LAO 5 CAU 5.  Tobias Alexander   Electronically Signed  By: Tobias Alexander  On: 10/24/2014 15:04    CLINICAL DATA: 81 year old female with history of severe aortic stenosis. Preprocedural study prior to potential transcatheter aortic valve replacement (TAVR).  EXAM: CT ANGIOGRAPHY CHEST, ABDOMEN AND PELVIS  TECHNIQUE: Multidetector CT imaging through the chest, abdomen and pelvis was performed using the standard protocol during bolus administration of intravenous contrast. Multiplanar  reconstructed images and MIPs were obtained and reviewed to evaluate the vascular anatomy.  CONTRAST: 80mL OMNIPAQUE IOHEXOL 350 MG/ML SOLN  COMPARISON: No priors.  FINDINGS: CTA CHEST FINDINGS  Mediastinum/Lymph Nodes: Heart size is mildly enlarged with left atrial dilatation. There is no significant pericardial fluid, thickening or pericardial calcification. The apex of the left ventricle is immediately deep to the anterior aspect of the the left sixth/seventh intercostal space. Severely thickened and heavily calcified aortic valve (particularly the non coronary cusp). There is atherosclerosis of the thoracic aorta, the great vessels of the mediastinum and the coronary arteries, including calcified atherosclerotic plaque in the left anterior descending and left circumflex coronary arteries. No pathologically enlarged mediastinal or hilar lymph nodes. Small hiatal hernia. No axillary lymphadenopathy.  Lungs/Pleura: Calcified granuloma in the right upper lobe with adjacent area of architectural distortion which presumably reflects some post infectious/ inflammatory changes. Nodular bilateral apical pleural parenchymal thickening, favored to reflect chronic post infectious or inflammatory scarring. No other suspicious appearing pulmonary nodules or masses are noted. No acute consolidative airspace disease. No pleural effusions.  Musculoskeletal/Soft Tissues: Multiple vertebral body compression fractures, chronic appearing T10 compression fracture with approximately 50% loss of anterior vertebral body height. Mild compression of the superior endplate of T11 as well, with approximately 20% loss of central vertebral body height. There are no aggressive appearing lytic or blastic lesions noted in the visualized portions of the skeleton.  CTA ABDOMEN AND PELVIS FINDINGS  Hepatobiliary: No cystic or solid hepatic lesions. No intra or extrahepatic biliary ductal  dilatation. Gallbladder is normal in appearance.  Pancreas: No pancreatic mass. No pancreatic ductal dilatation. No pancreatic or peripancreatic fluid or inflammatory changes.  Spleen: Unremarkable.  Adrenals/Urinary Tract: Bilateral kidneys and bilateral adrenal glands are normal in appearance. Right-sided extra renal pelvis (normal anatomical variant). No hydroureteronephrosis or perinephric stranding to indicate urinary tract obstruction at this time. Urinary bladder is normal in appearance.  Stomach/Bowel: Normal appearance of the stomach. No pathologic dilatation of small bowel or colon. Numerous colonic diverticulae are noted, without surrounding inflammatory changes to suggest an acute diverticulitis at this time.  Vascular/Lymphatic: Vascular findings and measurements pertinent to potential TAVR procedure, as detailed below. Single renal arteries bilaterally. Celiac axis, superior mesenteric artery and inferior mesenteric artery are all patent. Extensive atheromatous plaque throughout the abdominal aorta.  Reproductive: Uterus and ovaries are atrophic.  Other: No significant volume of ascites. No pneumoperitoneum.  Musculoskeletal: Old vertebral body compression fracture of L1 with approximately 25% loss of anterior vertebral body height. There are no aggressive appearing lytic or blastic lesions noted in the visualized portions of the skeleton.  VASCULAR MEASUREMENTS PERTINENT TO TAVR:  AORTA:  Minimal Aortic Diameter - 8 x 6 mm  Severity of Aortic Calcification - moderate to severe  RIGHT PELVIS:  Right Common Iliac Artery -  Minimal Diameter - 4.8 x 4.4 mm  Tortuosity - mild to moderate  Calcification - moderate to severe  Right External Iliac Artery -  Minimal Diameter - 5.4 x 4.1 mm  Tortuosity - mild  Calcification - mild  Right Common Femoral Artery -  Minimal Diameter - 4.5 x 3.9 mm  Tortuosity -  mild  Calcification - mild to moderate  LEFT PELVIS:  Left Common Iliac Artery -  Minimal Diameter - 7.9 x 4.9 mm  Tortuosity - moderate to severe  Calcification - moderate to severe  Left External Iliac Artery -  Minimal Diameter - 5.4 x 5.6 mm  Tortuosity - mild  Calcification - mild  Left Common Femoral Artery -  Minimal Diameter - 4.9 x 4.6 mm  Tortuosity - mild  Calcification - mild to moderate  Review of the MIP images confirms the above findings.  IMPRESSION: 1. Vascular findings and measurements pertinent to potential TAVR procedure, as detailed above. This patient does not appear to have suitable pelvic arterial access based predominantly on the small size of her pelvic vessels. 2. The apex of the left ventricle is immediately deep to the anterior aspect of the the left sixth/seventh intercostal space. 3. Bilateral apical nodular pleuroparenchymal thickening, strongly favored to represent areas of chronic post infectious or inflammatory scarring. Unfortunately, we have no prior studies available for comparison. Repeat noncontrast chest CT is recommended in 6 months to ensure the stability of these findings. 4. Colonic diverticulosis without findings to suggest acute diverticulitis at this time. 5. Additional incidental findings, as above.   Electronically Signed  By: Trudie Reed M.D.  On: 10/23/2014 11:41    Ref Range 12d ago    FVC-Pre L 1.67   FVC-%Pred-Pre % 97   FVC-Post L 1.75   FVC-%Pred-Post % 102   FVC-%Change-Post % 4   FEV1-Pre L 1.15   FEV1-%Pred-Pre % 93   FEV1-Post L 1.29   FEV1-%Pred-Post % 104   FEV1-%Change-Post % 12   FEV6-Pre L 1.63   FEV6-%Pred-Pre % 103   FEV6-Post L 1.72   FEV6-%Pred-Post % 108   FEV6-%Change-Post % 5   Pre FEV1/FVC ratio % 69   FEV1FVC-%Pred-Pre % 95   Post FEV1/FVC ratio % 74  FEV1FVC-%Change-Post % 7   Pre FEV6/FVC Ratio % 97   FEV6FVC-%Pred-Pre % 105   Post FEV6/FVC ratio % 98   FEV6FVC-%Pred-Post % 107   FEV6FVC-%Change-Post % 1   FEF 25-75 Pre L/sec 0.66   FEF2575-%Pred-Pre % 86   FEF 25-75 Post L/sec 1.11   FEF2575-%Pred-Post % 146   FEF2575-%Change-Post % 69   RV L 2.01   RV % pred % 88   TLC L 3.77   TLC % pred % 87   DLCO unc ml/min/mmHg 9.03   DLCO unc % pred % 51   DL/VA ml/min/mmHg/L 1.61   DL/VA % pred % 83   Resulting Agency [facility]       Specimen Collected: 10/23/14 11:20 AM Last Resulted: 10/23/14 12:09 PM                 Results        Scan on 10/23/2014 9:33 PM by Waymon Budge, MDScan on 10/23/2014 9:33 PM by Waymon Budge, MD                                                                 STS Risk Calculator Procedure: AV Replacement  Risk of Mortality: 4.753%  Morbidity or Mortality: 21.097%  Long Length of Stay: 9.133%  Short Length of Stay: 22.323%  Permanent Stroke: 3.309%  Prolonged Ventilation: 13.035%  DSW Infection: 0.096%  Renal Failure: 4.686%  Reoperation: 9.965%    Impression:  This patient has stage D severe symptomatic aortic stenosis with NYHA class III symptoms of dyspnea and fatigue with minimal exertion. I have personally reviewed her echo from 02/2014 and her recent cardiac cath. She has a trileaflet aortic valve that is severely calcified with markedly restricted leaflet mobility. Although her mean gradient on echo was only 32 mm Hg, her dimensionless index was 0.21 consistent with severe AS and her valve looks like a severely stenotic valve. Her gradient by cath was only in the moderate range but this frequently does not correlate with the degree of stenosis. She has  been scheduled for a repeat echo next week. Her cath shows patent stents and non-obstructive coronary disease. She did have moderate MR by echo and cath. Her ascending aorta is calcified on fluoroscopy. I think aortic valve replacement is indicated in this patient. She would be a very high risk candidate for conventional open surgical AVR due to her age, calcified ascending aorta that may require aortic replacement, and moderate MR that may require repair at the same time. I think TAVR would be the best option for treating her aortic stenosis. Her STS PROM is only 4.75 % but I think it is probably much higher due to her calcified aorta and moderate MR.  She is a candidate for a 23 mm Sapien 3 valve. Her pelvic vessels are small and have mild calcific plaque segmentally but I think they are large enough for a 14 F sheath. Her ascending aorta has some focal calcium on the left anterolateral surface as well as in the root but I think transaortic access would be possible if her pelvic vessels are too small.  I discussed what types of management strategies would be attempted intraoperatively in the event of life-threatening complications, including whether or not the patient  would be considered a candidate for the use of cardiopulmonary bypass and/or conversion to open sternotomy for attempted surgical intervention. The patient has been advised of a variety of complications that might develop including but not limited to risks of death, stroke, paravalvular leak, aortic dissection or other major vascular complications, aortic annulus rupture, device embolization, cardiac rupture or perforation, mitral regurgitation, acute myocardial infarction, arrhythmia, heart block or bradycardia requiring permanent pacemaker placement, congestive heart failure, respiratory failure, renal failure, pneumonia, infection, other late complications related to structural valve deterioration or migration, or other complications that  might ultimately cause a temporary or permanent loss of functional independence or other long term morbidity. The patient provides full informed consent for the procedure as described and all questions were answered.      Plan:  Transfemoral TAVR on 11/26/2014.   Alleen Borne, MD [facility] [phone]

## 2014-11-26 NOTE — Progress Notes (Signed)
Hypoglycemic Event  CBG: 63   Treatment: 1/2 amp D50. (She is still very sleepy and minimal bowel sounds) given at 1955  Symptoms: none  Follow-up CBG: Time: 2030 CBG Result: 93  Possible Reasons for Event: NPO  Comments/MD notified:    Rosie FateHaggard, Amarius Toto Brooke  Remember to initiate Hypoglycemia Order Set & complete

## 2014-11-26 NOTE — Progress Notes (Signed)
Patient ID: Kara Santiago, female   DOB: 06-24-1925, 81 y.o.   MRN: [id number]  SICU Evening Rounds:  Hemodynamically stable  Sinus 50's. Avoid beta blockers.  Awake and alert, neuro intact.  Urine output ok  Left groin incision ok. Left foot is warm.

## 2014-11-26 NOTE — Op Note (Signed)
[facility]  TAVR OPERATIVE NOTE Date of Procedure:  11/26/2014  Preoperative Diagnosis: Severe Aortic Stenosis   Postoperative Diagnosis: Same   Procedure:    Transcatheter Aortic Valve Replacement - Transfemoral Approach  Edwards Sapien 3 THV (size 23 mm, model # 9600TFX, serial # [id number])   Co-Surgeons:  Alleen BorneBryan K Bartle, MD and Tonny BollmanMichael Ramiro Pangilinan, MD  Anesthesiologist:  Kipp Broodavid Joslin, MD  Echocardiographer:  Charlton HawsPeter Nishan, MD  Pre-operative Echo Findings:  Severe aortic stenosis  Mild global left ventricular systolic dysfunction  3+ MR  Post-operative Echo Findings:  No paravalvular leak  Improved left ventricular systolic function  MR unchanged  BRIEF CLINICAL NOTE AND INDICATIONS FOR SURGERY Patient is an 81 year old widowed white female with history of aortic stenosis, mitral regurgitation, coronary artery disease status post multivessel PCI and stenting in 2008, cerebrovascular disease, hypertension, peripheral vascular disease, and type 2 diabetes mellitus with been referred for a second surgical opinion to discuss treatment options for management of severe symptomatic aortic stenosis. The patient's cardiac history dates back to 2008 when she presented with symptoms of angina pectoralis. She was found to have severe two-vessel coronary artery disease and was treated using PCI and stenting with drug-eluting stents in both the left anterior descended coronary artery and the left circumflex coronary artery. She was noted to have mild to moderate aortic stenosis at the time and has been followed regularly ever since with serial echocardiograms. Over the past 2 months the patient has developed progressive symptoms of exertional shortness of breath. Follow-up transthoracic echocardiogram revealed the presence of severe aortic stenosis. Peak velocity across aortic valve measured 3.6 m/s corresponding to a mean transvalvular gradient of 32 mmHg. However, left  ventricular systolic function was moderately reduced with ejection fraction 40%. The dimensionless ratio between the left ventricular outflow tract and the aortic valve was very low, measuring 0.21. The patient was also noted to have moderate mitral regurgitation. She underwent left and right heart catheterization demonstrating continued patency in both previous stents and no other significant flow limiting coronary artery disease. By catheterization the mean transvalvular gradient measured 23 mmHg with peak to peak gradient 35 mmHg. The aortic valve area was calculated at 0.64 cm. Pulmonary artery pressures were mildly elevated. The patient was referred for elective surgical consultation and has been seen previously by Dr. Laneta SimmersBartle. He noted that the patient had severe calcification involving the aortic root and proximal ascending thoracic aorta, and he felt that transcatheter aortic valve replacement might be a preferable option to high-risk conventional surgery. The patient subsequently underwent CT angiography which suggests that she appears to be a reasonably good candidate for transcatheter aortic valve replacement using a 23 mm Edwards Sapien 3 transcatheter heart valve. She appears to have borderline adequate pelvic vascular access for transfemoral approach.  During the course of the patient's preoperative work up they have been evaluated comprehensively by a multidisciplinary team of specialists coordinated through the [facility] in [facility].  They have been demonstrated to suffer from symptomatic severe aortic stenosis as noted above. The patient has been counseled extensively as to the relative risks and benefits of all options for the treatment of severe aortic stenosis including long term medical therapy, conventional surgery for aortic valve replacement, and transcatheter aortic valve replacement.  The patient has been independently  evaluated by two cardiac surgeons including Dr Cornelius Moraswen and Dr. Laneta SimmersBartle, and they are felt to be at high risk for conventional surgical aortic valve  replacement based upon a predicted risk of mortality using the Society of Thor[employer] Surgeons risk calculator of 12.8%. Both surgeons indicated the patient would be a poor candidate for conventional surgery (predicted risk of mortality >15% and/or predicted risk of permanent morbidity >50%) because of comorbidities including advanced age, severely calcified ascending aorta and root, small aortic valve annulus, LV dysfunction with chronic heart failure.   Based upon review of all of the patient's preoperative diagnostic tests they are felt to be candidate for transcatheter aortic valve replacement using the transfemoral approach as an alternative to high risk conventional surgery.    Following the decision to proceed with transcatheter aortic valve replacement, a discussion has been held regarding what types of management strategies would be attempted intraoperatively in the event of life-threatening complications, including whether or not the patient would be considered a candidate for the use of cardiopulmonary bypass and/or conversion to open sternotomy for attempted surgical intervention.  The patient has been advised of a variety of complications that might develop peculiar to this approach including but not limited to risks of death, stroke, paravalvular leak, aortic dissection or other major vascular complications, aortic annulus rupture, device embolization, cardiac rupture or perforation, acute myocardial infarction, arrhythmia, heart block or bradycardia requiring permanent pacemaker placement, congestive heart failure, respiratory failure, renal failure, pneumonia, infection, other late complications related to structural valve deterioration or migration, or other complications that might ultimately cause a temporary or permanent loss of functional independence or  other long term morbidity.  The patient provides full informed consent for the procedure as described and all questions were answered preoperatively.  DETAILS OF THE OPERATIVE PROCEDURE PREPARATION:   The patient is brought to the operating room on the above mentioned date and central monitoring was established by the anesthesia team including placement of Swan-Ganz catheter and radial arterial line. The patient is placed in the supine position on the operating table.  Intravenous antibiotics are administered. General endotracheal anesthesia is induced uneventfully. A Foley catheter is placed.  Baseline transesophageal echocardiogram was performed. The patient's chest, abdomen, both groins, and both lower extremities are prepared and draped in a sterile manner. A time out procedure is performed.   PERIPHERAL ACCESS:   Using the modified Seldinger technique, femoral arterial and venous access was obtained with placement of 6 Fr sheaths on the right side.  A pigtail diagnostic catheter was passed through the right femoral arterial sheath under fluoroscopic guidance into the aortic root.  A temporary transvenous pacemaker catheter was passed through the right femoral venous sheath under fluoroscopic guidance into the right ventricle.  The pacemaker was tested to ensure stable lead placement and pacemaker capture. Aortic root angiography was performed in order to determine the optimal angiographic angle for valve deployment.   TRANSFEMORAL ACCESS:   A left femoral arterial cutdown was performed by Dr Laneta Simmers. Please see his separate operative note for details. The patient was heparinized systemically and ACT verified > 250 seconds.    A 14 Fr transfemoral E-sheath was introduced into the left femoral artery after progressively dilating over an Amplatz superstiff wire. An AL-1 catheter was used to direct a straight-tip exchange length wire across the native aortic valve into the left ventricle. This was  exchanged out for a pigtail catheter and position was confirmed in the LV apex. Simultaneous LV and Ao pressures were recorded.  The pigtail catheter was then exchanged for an Amplatz Extra-stiff wire in the LV apex. At that point, BAV was performed using a 20  mm valvuloplasty balloon.  Once optimal position was achieved, BAV was done under rapid ventricular pacing at 180 bpm. The patient recovered well hemodynamically.   TRANSCATHETER HEART VALVE DEPLOYMENT:  An Edwards Sapien 3 THV (size 23 mm) was prepared and crimped per manufacturer's guidelines, and the proper orientation of the valve is confirmed on the [employer] delivery system. The valve was advanced through the introducer sheath using normal technique until in an appropriate position in the abdominal aorta beyond the sheath tip. The balloon was then retracted and using the fine-tuning wheel was centered on the valve. The valve was then advanced across the aortic arch using appropriate flexion of the catheter. The valve was carefully positioned across the aortic valve annulus. The Commander catheter was retracted using normal technique. Once final position of the valve has been confirmed by angiographic assessment, the valve is deployed while temporarily holding ventilation and during rapid ventricular pacing to maintain systolic blood pressure < 50 mmHg and pulse pressure < 10 mmHg. The balloon inflation is held for >3 seconds after reaching full deployment volume. Once the balloon has fully deflated the balloon is retracted into the ascending aorta and valve function is assessed using TEE. There is felt to be no paravalvular leak and no central aortic insufficiency.  The patient had marked hypotension after valve deployment and required a small dose of epinephrine to support her blood pressure. Valve function was assessed by TEE and was normal. LV function appeared improved from baseline but this may have been related to drug effect.    PROCEDURE COMPLETION:  The sheath was then removed and arteriotomy repaired by Dr Laneta Simmers. Please see his separate report for details. Distal abdominal aortography was performed to evaluate for any arterial injury related to the procedure. There was no evidence dissection, perforation, or other vascular injury in the abdominal aorta, iliac artery, or common femoral artery. However, there was abrupt occlusion of the SFA just at the femoral bifurcation. Please see the report of Dr Laneta Simmers for details. After repair of the arteriotomy, a crossover catheter and angled glidewire were used to access the contralateral external iliac artery for selective angiography. This was performed with DSA technique and demonstrated normal flow through the femoral and SFA on the left.  Protamine was administered once femoral arterial repair was complete. The temporary pacemaker, pigtail catheters and femoral sheaths were removed with manual pressure used for hemostasis.   The patient tolerated the procedure well and is transported to the surgical intensive care in stable condition. There were no immediate intraoperative complications. All sponge instrument and needle counts are verified correct at completion of the operation.   The patient received a total of 130 mL of intravenous contrast during the procedure.  Tonny Bollman MD 11/26/2014 4:46 PM

## 2014-11-26 NOTE — Progress Notes (Signed)
  Echocardiogram Echocardiogram Transesophageal has been performed.  Arvil ChacoFoster, Lanissa Cashen 11/26/2014, 2:48 PM

## 2014-11-26 NOTE — Anesthesia Postprocedure Evaluation (Signed)
  Anesthesia Post-op Note  Patient: Barry BrunnerBeatrice S Dorow  Procedure(s) Performed: Procedure(s): TRANSCATHETER AORTIC VALVE REPLACEMENT, TRANSFEMORAL (N/A) TRANSESOPHAGEAL ECHOCARDIOGRAM (TEE) (N/A)  Patient Location: SICU  Anesthesia Type:General  Level of Consciousness: awake, oriented and sedated  Airway and Oxygen Therapy: Patient Spontanous Breathing and Patient connected to nasal cannula oxygen  Post-op Pain: mild  Post-op Assessment: Post-op Vital signs reviewed, Patient's Cardiovascular Status Stable, Respiratory Function Stable, Patent Airway and Pain level controlled              Post-op Vital Signs: stable  Last Vitals:  Filed Vitals:   11/26/14 1123  BP:   Pulse:   Resp: 18    Complications: No apparent anesthesia complications

## 2014-11-26 NOTE — Interval H&P Note (Signed)
History and Physical Interval Note:  11/26/2014 11:27 AM  Kara BrunnerBeatrice S Thornhill  has presented today for surgery, with the diagnosis of SEVERE AS  The various methods of treatment have been discussed with the patient and family. After consideration of risks, benefits and other options for treatment, the patient has consented to  Procedure(s): TRANSCATHETER AORTIC VALVE REPLACEMENT, TRANSFEMORAL (N/A) TRANSESOPHAGEAL ECHOCARDIOGRAM (TEE) (N/A) as a surgical intervention .  The patient's history has been reviewed, patient examined, no change in status, stable for surgery.  I have reviewed the patient's chart and labs.  Questions were answered to the patient's satisfaction.     Alleen BorneBryan K Clent Damore

## 2014-11-26 NOTE — Progress Notes (Deleted)
  Echocardiogram 2D Echocardiogram has been performed.  Arvil ChacoFoster, Jatin Naumann 11/26/2014, 2:41 PM

## 2014-11-27 ENCOUNTER — Inpatient Hospital Stay (HOSPITAL_COMMUNITY): Payer: Medicare Other

## 2014-11-27 ENCOUNTER — Encounter (HOSPITAL_COMMUNITY): Payer: Self-pay | Admitting: Cardiovascular Disease

## 2014-11-27 DIAGNOSIS — I6529 Occlusion and stenosis of unspecified carotid artery: Secondary | ICD-10-CM | POA: Diagnosis present

## 2014-11-27 DIAGNOSIS — Z953 Presence of xenogenic heart valve: Secondary | ICD-10-CM

## 2014-11-27 DIAGNOSIS — I359 Nonrheumatic aortic valve disorder, unspecified: Secondary | ICD-10-CM

## 2014-11-27 DIAGNOSIS — I2510 Atherosclerotic heart disease of native coronary artery without angina pectoris: Secondary | ICD-10-CM | POA: Diagnosis present

## 2014-11-27 DIAGNOSIS — I679 Cerebrovascular disease, unspecified: Secondary | ICD-10-CM | POA: Diagnosis present

## 2014-11-27 DIAGNOSIS — I350 Nonrheumatic aortic (valve) stenosis: Secondary | ICD-10-CM

## 2014-11-27 LAB — GLUCOSE, CAPILLARY
GLUCOSE-CAPILLARY: 171 mg/dL — AB (ref 65–99)
GLUCOSE-CAPILLARY: 68 mg/dL (ref 65–99)
GLUCOSE-CAPILLARY: 93 mg/dL (ref 65–99)
Glucose-Capillary: 117 mg/dL — ABNORMAL HIGH (ref 65–99)
Glucose-Capillary: 142 mg/dL — ABNORMAL HIGH (ref 65–99)

## 2014-11-27 LAB — BASIC METABOLIC PANEL
Anion gap: 7 (ref 5–15)
BUN: 6 mg/dL (ref 6–20)
CO2: 24 mmol/L (ref 22–32)
Calcium: 8.2 mg/dL — ABNORMAL LOW (ref 8.9–10.3)
Chloride: 111 mmol/L (ref 101–111)
Creatinine, Ser: 0.82 mg/dL (ref 0.44–1.00)
GFR calc Af Amer: >60 mL/min (ref >60)
GFR calc non Af Amer: >60 mL/min (ref >60)
GLUCOSE: 112 mg/dL — AB (ref 65–99)
POTASSIUM: 3.8 mmol/L (ref 3.5–5.1)
SODIUM: 142 mmol/L (ref 135–145)

## 2014-11-27 LAB — URINE MICROSCOPIC-ADD ON

## 2014-11-27 LAB — URINALYSIS, ROUTINE W REFLEX MICROSCOPIC
BILIRUBIN URINE: NEGATIVE
Glucose, UA: NEGATIVE mg/dL
KETONES UR: NEGATIVE mg/dL
Leukocytes, UA: NEGATIVE
Nitrite: NEGATIVE
PH: 5 (ref 5.0–8.0)
PROTEIN: NEGATIVE mg/dL
Specific Gravity, Urine: 1.023 (ref 1.005–1.030)
Urobilinogen, UA: 0.2 mg/dL (ref 0.0–1.0)

## 2014-11-27 LAB — CBC
HEMATOCRIT: 34.5 % — AB (ref 36.0–46.0)
Hemoglobin: 11.2 g/dL — ABNORMAL LOW (ref 12.0–15.0)
MCH: 30.9 pg (ref 26.0–34.0)
MCHC: 32.5 g/dL (ref 30.0–36.0)
MCV: 95 fL (ref 78.0–100.0)
PLATELETS: 180 10*3/uL (ref 150–400)
RBC: 3.63 MIL/uL — ABNORMAL LOW (ref 3.87–5.11)
RDW: 15 % (ref 11.5–15.5)
WBC: 6.6 10*3/uL (ref 4.0–10.5)

## 2014-11-27 LAB — MAGNESIUM: MAGNESIUM: 1.7 mg/dL (ref 1.7–2.4)

## 2014-11-27 MED ORDER — METOPROLOL TARTRATE 12.5 MG HALF TABLET
12.5000 mg | ORAL_TABLET | Freq: Two times a day (BID) | ORAL | Status: DC
  Administered 2014-11-27 – 2014-11-29 (×5): 12.5 mg via ORAL
  Filled 2014-11-27 (×6): qty 1

## 2014-11-27 MED ORDER — FUROSEMIDE 20 MG PO TABS
20.0000 mg | ORAL_TABLET | Freq: Every day | ORAL | Status: DC
  Administered 2014-11-27 – 2014-11-28 (×2): 20 mg via ORAL
  Filled 2014-11-27 (×2): qty 1

## 2014-11-27 MED ORDER — LISINOPRIL 20 MG PO TABS
20.0000 mg | ORAL_TABLET | Freq: Every day | ORAL | Status: DC
  Administered 2014-11-27 – 2014-11-28 (×2): 20 mg via ORAL
  Filled 2014-11-27 (×2): qty 1

## 2014-11-27 MED ORDER — GLIPIZIDE 5 MG PO TABS
5.0000 mg | ORAL_TABLET | Freq: Every day | ORAL | Status: DC
  Administered 2014-11-27: 5 mg via ORAL
  Filled 2014-11-27 (×3): qty 1

## 2014-11-27 MED FILL — Insulin Regular (Human) Inj 100 Unit/ML: INTRAMUSCULAR | Qty: 2.5 | Status: AC

## 2014-11-27 MED FILL — Magnesium Sulfate Inj 50%: INTRAMUSCULAR | Qty: 10 | Status: AC

## 2014-11-27 MED FILL — Heparin Sodium (Porcine) Inj 1000 Unit/ML: INTRAMUSCULAR | Qty: 30 | Status: AC

## 2014-11-27 MED FILL — Potassium Chloride Inj 2 mEq/ML: INTRAVENOUS | Qty: 40 | Status: AC

## 2014-11-27 NOTE — Progress Notes (Signed)
  Echocardiogram 2D Echocardiogram has been performed.  Kara SavoyCasey N Shantell Belongia 11/27/2014, 12:47 PM

## 2014-11-27 NOTE — Progress Notes (Signed)
Anesthesiology Follow-up:  Awake and alert, neuro intact, only complaint is  a mild sore throat. Hemodynamically stable on low dose nitroglycerin for BP control  VS: T- 38.3 BP-131/52 HR-83 (SR with occasional PVCs) RR 17 O2 Sat 96% on 2L PA 39/13 CO/CI-4.2/2.9   K-3.8 glucose-123 BUN/Cr.-6/0.82 H/H- 11.2/34.5 Plts- 180,000  POD #1 S/P TAVR with #23 Sapien 3 and repair of arteriotomy at insertion site. Overall doing well, no apparent complications  Kipp Broodavid Jestin Burbach

## 2014-11-27 NOTE — Progress Notes (Addendum)
Patient Name: Kara Santiago Date of Encounter: 11/27/2014   Principal Problem:   Severe aortic stenosis Active Problems:   Status post transcatheter aortic valve replacement (TAVR) using bioprosthesis   CAD (coronary artery disease)   Type II diabetes mellitus   HYPERTENSION, BENIGN   CVD (cerebrovascular disease)   Hyperlipidemia   Peripheral vascular disease   Carotid artery occlusion    SUBJECTIVE  Kara Santiago has done reasonably well overnight.  She has been running a low grade fever but is asymptomatic.  She had a brief episode of chest pain/indigestion this AM, that improved after belching.  CURRENT MEDS . acetaminophen  1,000 mg Oral 4 times per day   Or  . acetaminophen (TYLENOL) oral liquid 160 mg/5 mL  1,000 mg Per Tube 4 times per day  . antiseptic oral rinse  7 mL Mouth Rinse BID  . aspirin EC  81 mg Oral Daily  . atorvastatin  80 mg Oral q1800  . cefUROXime (ZINACEF)  IV  1.5 g Intravenous Q12H  . clopidogrel  75 mg Oral Q breakfast  . famotidine (PEPCID) IV  20 mg Intravenous Q12H  . insulin aspart  0-15 Units Subcutaneous TID WC  . lisinopril  40 mg Oral Daily  . [START ON 11/28/2014] pantoprazole  40 mg Oral Daily  . sodium chloride  3 mL Intravenous Q12H  . vancomycin  1,000 mg Intravenous Once    OBJECTIVE  Filed Vitals:   11/27/14 0515 11/27/14 0530 11/27/14 0545 11/27/14 0600  BP: 128/82 123/41 128/47 131/100  Pulse: 93 81 79 83  Temp: 100.8 F (38.2 C) 100.9 F (38.3 C) 100.9 F (38.3 C) 101.1 F (38.4 C)  TempSrc:      Resp: Weight:    108 lb 8 oz (49.215 kg)  SpO2: 100% 97% 99% 96%    Intake/Output Summary (Last 24 hours) at 11/27/14 0645 Last data filed at 11/27/14 9604  Gross per 24 hour  Intake 2927.9 ml  Output   2035 ml  Net  892.9 ml   Filed Weights   11/27/14 0600  Weight: 108 lb 8 oz (49.215 kg)    PHYSICAL EXAM  General: Pleasant, NAD. Neuro: Alert and oriented X 3. Moves all extremities  spontaneously. Psych: Normal affect. HEENT:  Normal  Neck: Supple without bruits or JVD. Lungs:  Resp regular and unlabored, CTA. Heart: RRR no s3, s4, 2/6 SEM RUSB. Abdomen: Soft, non-tender, non-distended, BS + x 4.  Extremities: No clubbing, cyanosis or edema. DP/PT/Radials 2+ and equal bilaterally.  L femoral incision w/o bleeding/bruit/hematoma.  Accessory Clinical Findings  CBC  Recent Labs  11/26/14 1638 11/27/14 0541  WBC 5.4 6.6  HGB 11.0* 11.2*  HCT 34.0* 34.5*  MCV 95.0 95.0  PLT 193 180   Basic Metabolic Panel  Recent Labs  11/26/14 1524 11/26/14 1621 11/27/14 0541  NA 142 141 142  K 3.8 4.1 3.8  CL 109  --  111  CO2  --   --  24  GLUCOSE 148* 144* 112*  BUN 8  --  6  CREATININE 0.60  --  0.82  CALCIUM  --   --  8.2*  MG  --   --  1.7    TELE  Rsr, freq pvc's.  ECG  RSR, 86, LBBB.  Radiology/Studies  Dg Chest 2 View  11/21/2014   CLINICAL DATA:  Preoperative exam prior to valvuloplasty for aortic stenosis  EXAM: CHEST  2 VIEW  COMPARISON:  None.  FINDINGS: The heart size is at upper limits of normal. Both lungs are clear. The visualized skeletal structures are unremarkable.  IMPRESSION: No active cardiopulmonary disease.   Electronically Signed   By: Christiana PellantGretchen  Green M.D.   On: 11/21/2014 15:05   Dg Chest Port 1 View  11/26/2014   CLINICAL DATA:  Status post aortic valve replacement  EXAM: PORTABLE CHEST - 1 VIEW  COMPARISON:  11/21/2014  FINDINGS: Aortic valve replacement identified in anticipated position. Right internal jugular central line is seen with tip over the proximal right main pulmonary artery. Second internal jugular line on the right projects with tip just into the right atrium.  Stable mild cardiac enlargement. There is mild to moderate vascular congestion and mild interstitial prominence. No pneumothorax.  IMPRESSION: Postoperative findings as described above. Mild postoperative pulmonary edema.   Electronically Signed   By: Esperanza Heiraymond   Rubner M.D.   On: 11/26/2014 16:24    ASSESSMENT AND PLAN  1.  Severe Aortic Stenosis s/p TAVR (Edwards Sapien 3 THV - 23mm):  S/P TAVR yesterday via left femoral approach.  Doing well w/ stable hemodynamics and no significant bradycardia overnight (rates initially 40's but 70's+ since ~ 8 pm last night).  Episode of indigestion this AM - cleared with belching.  No dyspnea.  Appears euvolemic on exam. PA diast 9-10.  Echo today.  Plan for lines out later today, prob tx to floor.  2.  Low-grade fever:  No complaints.  WBC nl.  CXR w/o evidence of pna.  Check UA.  Currently on cefuroxime q 12 x 4.    3.  CAD:  Episode of chest/epigastric pain this AM - resolved with belching.  Recent cath in May revealed patent LAD/LCX stents with otw minimal nonobs dzs.  Cont asa, statin.  On metoprolol @ home - probably resume today, will d/w Dr. Excell Seltzerooper.  4.  HL:  LDL 91 in 02/2014.  Nl LFT's on 6/30.  Cont high potency statin therapy.  5.  Type II DM:  Glucose 112 this AM.  Cont SSI.  On glipizide @ home.  6.  Essential HTN:  BP trended up overnight.  Currently on home dose of lisinopril and IV ntg @ 15 mcg.  Wean ntg today with plan to resume bb.  HR stable.   7. Acute on chronic combined systolic and diastolic heart failure, likely related to valvular heart disease with severe AS and moderate-severe MR. Reported progressive dyspnea and some resting symptoms prior to TAVR. Volume status looks good. Check echo today. Add low-dose furosemide as tolerated.  Signed, Kara Duckinghristopher Berge NP  Patient seen, examined. Available data reviewed. Agree with findings, assessment, and plan as outlined by Kara Givenshris Berge, NP, with changes made where indicated.  Exam reveals an alert, oriented woman in NAD. Lungs clear. Heart RRR with a 3/6 holosystolic murmur at the apex. No peripheral edema. Pulses 2+ right DP and 1+ left DP. Feet warm. Tele shows sinus rhythm. Agree with plans as above to resume beta-blocker, stop IV NTG, d/c  lines, and tx 2W. Check echo today.  Kara Santiago, M.D. 11/27/2014 7:50 AM

## 2014-11-27 NOTE — Progress Notes (Signed)
1 Day Post-Op Procedure(s) (LRB): TRANSCATHETER AORTIC VALVE REPLACEMENT, TRANSFEMORAL (N/A) TRANSESOPHAGEAL ECHOCARDIOGRAM (TEE) (N/A) Subjective: No complaints this am.  Objective: Vital signs in last 24 hours: Temp:  [96.6 F (35.9 C)-101.1 F (38.4 C)] 100.4 F (38 C) (07/06 0715) Pulse Rate:  [36-93] 81 (07/06 0715) Cardiac Rhythm:  [-] Sinus tachycardia (07/06 0600) Resp:  [12-24] 17 (07/06 0715) BP: (83-149)/(33-100) 101/35 mmHg (07/06 0715) SpO2:  [94 %-100 %] 98 % (07/06 0715) Arterial Line BP: (107-182)/(31-98) 129/35 mmHg (07/06 0715) Weight:  [49.215 kg (108 lb 8 oz)] 49.215 kg (108 lb 8 oz) (07/06 0600)  Hemodynamic parameters for last 24 hours: PAP: (23-53)/(6-22) 31/7 mmHg CO:  [2.5 L/min-4.2 L/min] 4.2 L/min CI:  [1.8 L/min/m2-2.9 L/min/m2] 2.9 L/min/m2  Intake/Output from previous day: 07/05 0701 - 07/06 0700 In: 2952.4 [I.V.:2152.4; IV Piggyback:800] Out: 2035 [Urine:2035] Intake/Output this shift:    General appearance: alert and cooperative Neurologic: intact Heart: regular rate and rhythm, 2/6 systolic murmur at apex Lungs: clear to auscultation bilaterally Extremities: extremities normal, atraumatic, no cyanosis or edema.  Wound: left groin incision ok  Lab Results:  Recent Labs  11/26/14 1638 11/27/14 0541  WBC 5.4 6.6  HGB 11.0* 11.2*  HCT 34.0* 34.5*  PLT 193 180   BMET:  Recent Labs  11/26/14 1524 11/26/14 1621 11/27/14 0541  NA 142 141 142  K 3.8 4.1 3.8  CL 109  --  111  CO2  --   --  24  GLUCOSE 148* 144* 112*  BUN 8  --  6  CREATININE 0.60  --  0.82  CALCIUM  --   --  8.2*    PT/INR:  Recent Labs  11/26/14 1638  LABPROT 16.0*  INR 1.26   ABG    Component Value Date/Time   PHART 7.305* 11/26/2014 1615   HCO3 23.0 11/26/2014 1615   TCO2 24 11/26/2014 1615   ACIDBASEDEF 4.0* 11/26/2014 1615   O2SAT 94.0 11/26/2014 1615   CBG (last 3)   Recent Labs  11/26/14 2346 11/27/14 0355 11/27/14 0728  GLUCAP 109*  117* 171*   CLINICAL DATA: Status post aortic valve replacement.  EXAM: PORTABLE CHEST - 1 VIEW  COMPARISON: 11/26/2014  FINDINGS: Right jugular Swan-Ganz catheter remains in place with tip overlying the pulmonary outflow tract. Additional right jugular central venous catheter terminates over the cavoatrial junction. Sequelae of aortic valve replacement are again identified. Thoracic aortic calcification is noted. Cardiac silhouette remains mildly enlarged. Pulmonary vascular congestion on the prior study has largely resolved. Left basilar opacity is stable to minimally increased from the prior study, most compatible with atelectasis. No overt pulmonary edema, sizable pleural effusion, or pneumothorax is identified.  IMPRESSION: Decreased pulmonary vascular congestion. Left basilar atelectasis.   Electronically Signed  By: Sebastian AcheAllen Grady  On: 11/27/2014 07:30  ECG: sinus 70, LBBB  Assessment/Plan: S/P Procedure(s) (LRB): TRANSCATHETER AORTIC VALVE REPLACEMENT, TRANSFEMORAL (N/A) TRANSESOPHAGEAL ECHOCARDIOGRAM (TEE) (N/A)  She is hemodynamically stable in sinus rhythm  Left lower extremity perfusion looks good.   Plan transfer to 2W per cardiology  Echo today.   LOS: 1 day    Alleen BorneBryan K Bartle 11/27/2014

## 2014-11-27 NOTE — Progress Notes (Signed)
Transferred to 2W11 via wheelchair. Portable monitor on. No changes. Report given to 2W11 RN

## 2014-11-27 NOTE — Progress Notes (Signed)
Utilization review completed.  

## 2014-11-28 ENCOUNTER — Other Ambulatory Visit: Payer: Self-pay

## 2014-11-28 DIAGNOSIS — I350 Nonrheumatic aortic (valve) stenosis: Secondary | ICD-10-CM

## 2014-11-28 DIAGNOSIS — I5043 Acute on chronic combined systolic (congestive) and diastolic (congestive) heart failure: Secondary | ICD-10-CM

## 2014-11-28 DIAGNOSIS — Z952 Presence of prosthetic heart valve: Secondary | ICD-10-CM

## 2014-11-28 LAB — BASIC METABOLIC PANEL
ANION GAP: 5 (ref 5–15)
BUN: 9 mg/dL (ref 6–20)
CO2: 28 mmol/L (ref 22–32)
CREATININE: 1.05 mg/dL — AB (ref 0.44–1.00)
Calcium: 8.3 mg/dL — ABNORMAL LOW (ref 8.9–10.3)
Chloride: 105 mmol/L (ref 101–111)
GFR calc non Af Amer: 46 mL/min — ABNORMAL LOW (ref >60)
GFR, EST AFRICAN AMERICAN: 54 mL/min — AB (ref >60)
Glucose, Bld: 180 mg/dL — ABNORMAL HIGH (ref 65–99)
Potassium: 3.6 mmol/L (ref 3.5–5.1)
Sodium: 138 mmol/L (ref 135–145)

## 2014-11-28 LAB — TYPE AND SCREEN
ABO/RH(D): O POS
Antibody Screen: NEGATIVE
UNIT DIVISION: 0
Unit division: 0

## 2014-11-28 LAB — GLUCOSE, CAPILLARY
Glucose-Capillary: 103 mg/dL — ABNORMAL HIGH (ref 65–99)
Glucose-Capillary: 107 mg/dL — ABNORMAL HIGH (ref 65–99)
Glucose-Capillary: 113 mg/dL — ABNORMAL HIGH (ref 65–99)
Glucose-Capillary: 158 mg/dL — ABNORMAL HIGH (ref 65–99)

## 2014-11-28 LAB — CBC
HCT: 34.5 % — ABNORMAL LOW (ref 36.0–46.0)
HEMOGLOBIN: 11.6 g/dL — AB (ref 12.0–15.0)
MCH: 32 pg (ref 26.0–34.0)
MCHC: 33.6 g/dL (ref 30.0–36.0)
MCV: 95.3 fL (ref 78.0–100.0)
Platelets: 158 10*3/uL (ref 150–400)
RBC: 3.62 MIL/uL — ABNORMAL LOW (ref 3.87–5.11)
RDW: 15.1 % (ref 11.5–15.5)
WBC: 7.4 10*3/uL (ref 4.0–10.5)

## 2014-11-28 MED ORDER — LISINOPRIL 5 MG PO TABS
5.0000 mg | ORAL_TABLET | Freq: Every day | ORAL | Status: DC
  Administered 2014-11-29: 5 mg via ORAL
  Filled 2014-11-28: qty 1

## 2014-11-28 NOTE — Progress Notes (Addendum)
      [address]       [place] [id number]             [phone]        2 Days Post-Op Procedure(s) (LRB): TRANSCATHETER AORTIC VALVE REPLACEMENT, TRANSFEMORAL (N/A) TRANSESOPHAGEAL ECHOCARDIOGRAM (TEE) (N/A)  Subjective: Patient with nausea this am.  Objective: Vital signs in last 24 hours: Temp:  [98.6 F (37 C)-100 F (37.8 C)] 99.1 F (37.3 C) (07/07 0552) Pulse Rate:  [69-84] 69 (07/07 0552) Cardiac Rhythm:  [-] Normal sinus rhythm (07/06 2100) Resp:  [14-20] 18 (07/07 0552) BP: (103-135)/(36-77) 135/46 mmHg (07/07 0552) SpO2:  [93 %-99 %] 96 % (07/07 0552) Weight:  [111 lb 8.8 oz (50.6 kg)] 111 lb 8.8 oz (50.6 kg) (07/07 0552)   Current Weight  11/28/14 111 lb 8.8 oz (50.6 kg)    Hemodynamic parameters for last 24 hours: PAP: (35-41)/(10-16) 41/15 mmHg CO:  [4.1 L/min] 4.1 L/min CI:  [2.5 L/min/m2] 2.5 L/min/m2  Intake/Output from previous day: 07/06 0701 - 07/07 0700 In: 130 [I.V.:80; IV Piggyback:50] Out: 225 [Urine:225]   Physical Exam:  Cardiovascular: RRR, systolic murmur Pulmonary: Clear to auscultation bilaterally; no rales, wheezes, or rhonchi. Abdomen: Soft, non tender, bowel sounds present. Extremities: No lower extremity edema. Palpable DP bilaterally Wounds: Left groin wound is clean and dry.  No erythema or signs of infection.  Lab Results: CBC: Recent Labs  11/26/14 1638 11/27/14 0541  WBC 5.4 6.6  HGB 11.0* 11.2*  HCT 34.0* 34.5*  PLT 193 180   BMET:  Recent Labs  11/26/14 1524 11/26/14 1621 11/27/14 0541  NA 142 141 142  K 3.8 4.1 3.8  CL 109  --  111  CO2  --   --  24  GLUCOSE 148* 144* 112*  BUN 8  --  6  CREATININE 0.60  --  0.82  CALCIUM  --   --  8.2*    PT/INR:  Lab Results  Component Value Date   INR 1.26 11/26/2014   INR 1.07 11/21/2014   INR 1.1* 10/01/2014   ABG:  INR: Will add last result for INR, ABG once components are confirmed Will add last 4 CBG results once components  are confirmed  Assessment/Plan:  1. CV - SR in the 60's. On Lopressor 12.5 mg bid, Lisinopril 20 mg daily, and Plavix 75 mg daily. 2.  Pulmonary - On room air. 3. Volume Overload - On Lasix 20 mg daily 4.  Acute blood loss anemia - H and H yesterday stable 11.2 and 34.5 5.DM-CBGs 68/93/103. On Glipizide 5 mg daily. Will stop as not eating much. Continue SS PRN 6.GI-nausea. Zofran PRN. Stop Oxy-not given anyway.   ZIMMERMAN,DONIELLE MPA-C 11/28/2014,7:41 AM   Chart reviewed, patient examined, agree with above. She feels well and is ambulating well. Left groin incision ok. Left dp pulse palpable. She wants to go home tomorrow which should be fine. Echo from yesterday reviewed and her valve is working well with mean gradient 9 mm Hg, no paravalvular leak or AI.

## 2014-11-28 NOTE — Progress Notes (Addendum)
Patient Name: Kara BrunnerBeatrice S Santiago Date of Encounter: 11/28/2014    Principal Problem:   Severe aortic stenosis Active Problems:   Status post transcatheter aortic valve replacement (TAVR) using bioprosthesis   CAD (coronary artery disease)   Type II diabetes mellitus   HYPERTENSION, BENIGN   CVD (cerebrovascular disease)   Acute on chronic combined systolic and diastolic congestive heart failure   Hyperlipidemia   Peripheral vascular disease   Carotid artery occlusion    SUBJECTIVE  No chest pain or sob.  Ambulating some in the room - feels a little weak but otw doing ok.  Has had a nagging, non-productive cough.  Still w/ a low-grade fever.  CURRENT MEDS . acetaminophen  1,000 mg Oral 4 times per day   Or  . acetaminophen (TYLENOL) oral liquid 160 mg/5 mL  1,000 mg Per Tube 4 times per day  . antiseptic oral rinse  7 mL Mouth Rinse BID  . aspirin EC  81 mg Oral Daily  . atorvastatin  80 mg Oral q1800  . cefUROXime (ZINACEF)  IV  1.5 g Intravenous Q12H  . clopidogrel  75 mg Oral Q breakfast  . furosemide  20 mg Oral Daily  . glipiZIDE  5 mg Oral QAC breakfast  . insulin aspart  0-15 Units Subcutaneous TID WC  . lisinopril  20 mg Oral Daily  . metoprolol tartrate  12.5 mg Oral BID  . pantoprazole  40 mg Oral Daily  . sodium chloride  3 mL Intravenous Q12H    OBJECTIVE  Filed Vitals:   11/27/14 1200 11/27/14 1320 11/27/14 2218 11/28/14 0552  BP: 113/48 103/77 113/41 135/46  Pulse: 74 69 82 69  Temp:  98.6 F (37 C) 98.7 F (37.1 C) 99.1 F (37.3 C)  TempSrc:  Oral Oral Oral  Resp: 19 20 18 18   Weight:    111 lb 8.8 oz (50.6 kg)  SpO2: 99% 99% 93% 96%    Intake/Output Summary (Last 24 hours) at 11/28/14 0750 Last data filed at 11/27/14 1200  Gross per 24 hour  Intake     80 ml  Output    225 ml  Net   -145 ml   Filed Weights   11/27/14 0600 11/28/14 0552  Weight: 108 lb 8 oz (49.215 kg) 111 lb 8.8 oz (50.6 kg)    PHYSICAL EXAM  General: Pleasant,  NAD. Neuro: Alert and oriented X 3. Moves all extremities spontaneously. Psych: Normal affect. HEENT:  Normal  Neck: Supple without bruits or JVD. Lungs:  Resp regular and unlabored, CTA. Heart: RRR no s3, s4, 2/6 syst murmur LUSB apex. Abdomen: Soft, non-tender, non-distended, BS + x 4.  Extremities: No clubbing, cyanosis or edema. DP/PT/Radials 2+ and equal bilaterally.  Accessory Clinical Findings  CBC  Recent Labs  11/26/14 1638 11/27/14 0541  WBC 5.4 6.6  HGB 11.0* 11.2*  HCT 34.0* 34.5*  MCV 95.0 95.0  PLT 193 180   Basic Metabolic Panel  Recent Labs  11/26/14 1524 11/26/14 1621 11/27/14 0541  NA 142 141 142  K 3.8 4.1 3.8  CL 109  --  111  CO2  --   --  24  GLUCOSE 148* 144* 112*  BUN 8  --  6  CREATININE 0.60  --  0.82  CALCIUM  --   --  8.2*  MG  --   --  1.7   TELE  Rsr, freq pvc's, occas pac's.  Radiology/Studies  Dg Chest 2 View  11/21/2014  CLINICAL DATA:  Preoperative exam prior to valvuloplasty for aortic stenosis  EXAM: CHEST  2 VIEW  COMPARISON:  None.  FINDINGS: The heart size is at upper limits of normal. Both lungs are clear. The visualized skeletal structures are unremarkable.  IMPRESSION: No active cardiopulmonary disease.   Electronically Signed   By: Christiana Pellant M.D.   On: 11/21/2014 15:05   Dg Chest Port 1 View  11/27/2014   CLINICAL DATA:  Status post aortic valve replacement.  EXAM: PORTABLE CHEST - 1 VIEW  COMPARISON:  11/26/2014  FINDINGS: Right jugular Swan-Ganz catheter remains in place with tip overlying the pulmonary outflow tract. Additional right jugular central venous catheter terminates over the cavoatrial junction. Sequelae of aortic valve replacement are again identified. Thoracic aortic calcification is noted. Cardiac silhouette remains mildly enlarged. Pulmonary vascular congestion on the prior study has largely resolved. Left basilar opacity is stable to minimally increased from the prior study, most compatible with  atelectasis. No overt pulmonary edema, sizable pleural effusion, or pneumothorax is identified.  IMPRESSION: Decreased pulmonary vascular congestion.  Left basilar atelectasis.   Electronically Signed   By: Sebastian Ache   On: 11/27/2014 07:30   Dg Chest Port 1 View  11/26/2014   CLINICAL DATA:  Status post aortic valve replacement  EXAM: PORTABLE CHEST - 1 VIEW  COMPARISON:  11/21/2014  FINDINGS: Aortic valve replacement identified in anticipated position. Right internal jugular central line is seen with tip over the proximal right main pulmonary artery. Second internal jugular line on the right projects with tip just into the right atrium.  Stable mild cardiac enlargement. There is mild to moderate vascular congestion and mild interstitial prominence. No pneumothorax.  IMPRESSION: Postoperative findings as described above. Mild postoperative pulmonary edema.   Electronically Signed   By: Esperanza Heir M.D.   On: 11/26/2014 16:24   _____________   2D Echocardiogram 7.6.2016  Study Conclusions  - Left ventricle: The cavity size was normal. Systolic function was   mildly reduced. The estimated ejection fraction was in the range   of 45% to 50%. Wall motion was normal; there were no regional   wall motion abnormalities. Doppler parameters are consistent with   abnormal left ventricular relaxation (grade 1 diastolic   dysfunction). Doppler parameters are consistent with high   ventricular filling pressure. - Ventricular septum: Septal motion showed abnormal function and   dyssynergy. - Aortic valve: Transvalvular velocity was minimally increased.   There was no stenosis. - Mitral valve: There was mild regurgitation. - Left atrium: The atrium was mildly dilated. - Pulmonary arteries: Systolic pressure was mildly increased. PA   peak pressure: 41 mm Hg (S). - Pericardium, extracardiac: A small pericardial effusion was   identified along the left ventricular free wall. Features were   not  consistent with tamponade physiology. _____________   ASSESSMENT AND PLAN - POD #2.  1. Severe Aortic Stenosis s/p TAVR (Edwards Sapien 3 THV - 23mm): S/P TAVR 7/5 via left femoral approach. Doing well w/ stable hemodynamics and w/o chest pain or dyspnea.  Echo 7/6 showed minimally increased transvalvular velocity.  Euvolemic on exam. Ambulate today.  Cont asa/plavix.  2. Low-grade fever: Temp 99.1 this AM.  She's had a non-productive cough but cxr yesterday did not show acute infxn.  WBC nl.  UA neg.  Follow.  3. CAD: No recurrent c/p/indigestion.  Recent cath in May revealed patent LAD/LCX stents with otw minimal nonobs dzs. Cont asa, statin, bb.  4. HL: LDL 91  in 02/2014. Nl LFT's on 6/30. Cont high potency statin therapy.  5. Type II DM: Glucose 103 this AM. Cont SSI. On glipizide @ home.  6. Essential HTN: BP stable on bb/acei.  7. Acute on chronic combined systolic and diastolic heart failure:  Likely related to valvular heart disease with severe AS and moderate-severe MR. Euvolemic on exam.  No dyspnea/orthopnea.    Echo with EF 45-50% - slightly better than last echo in May.  Cont low-dose furosemide as tolerated.  Cont bb/acei.  F/u BMET this AM.  Signed, Nicolasa Ducking NP  Patient seen, examined. Available data reviewed. Agree with findings, assessment, and plan as outlined by Ward Givens, NP. The patient looks great this afternoon. I talked with her walking down all. On exam her heart is regular rate and rhythm with a 2/6 systolic murmur at the right upper sternal border. Lungs are clear. There is no peripheral edema. I had a lengthy discussion with the patient and her daughter who is at the bedside. I reviewed her echocardiogram which shows normal function of her transcatheter heart valve. Will stop Lasix and reduce lisinopril as her blood pressure is soft.  Anticipate discharge home tomorrow.  Tonny Bollman, M.D. 11/28/2014 4:51 PM

## 2014-11-28 NOTE — Care Management Note (Signed)
Case Management Note  Patient Details  Name: Barry BrunnerBeatrice S Citron MRN: [id number] Date of Birth: [birth year]  Subjective/Objective:  Pt admitted with severe aortic stenosis                  Action/Plan: Pt is independent from home with daughter.  CM will continue to monitor for disposition plan   Expected Discharge Date:  11/29/14               Expected Discharge Plan:  Home/Self Care  In-House Referral:     Discharge planning Services  CM Consult  Post Acute Care Choice:    Choice offered to:     DME Arranged:    DME Agency:     HH Arranged:    HH Agency:     Status of Service:  In process, will continue to follow  Medicare Important Message Given:    Date Medicare IM Given:    Medicare IM give by:    Date Additional Medicare IM Given:    Additional Medicare Important Message give by:     If discussed at Long Length of Stay Meetings, dates discussed:    Additional Comments:  Cherylann ParrClaxton, Nashla Althoff S, RN 11/28/2014, 3:06 PM

## 2014-11-29 ENCOUNTER — Telehealth: Payer: Self-pay | Admitting: Cardiovascular Disease

## 2014-11-29 ENCOUNTER — Encounter (HOSPITAL_COMMUNITY): Payer: Self-pay | Admitting: Nurse Practitioner

## 2014-11-29 LAB — BASIC METABOLIC PANEL
Anion gap: 5 (ref 5–15)
BUN: 10 mg/dL (ref 6–20)
CHLORIDE: 107 mmol/L (ref 101–111)
CO2: 29 mmol/L (ref 22–32)
CREATININE: 0.85 mg/dL (ref 0.44–1.00)
Calcium: 8.4 mg/dL — ABNORMAL LOW (ref 8.9–10.3)
GFR calc Af Amer: >60 mL/min (ref >60)
GFR calc non Af Amer: 60 mL/min — ABNORMAL LOW (ref >60)
GLUCOSE: 116 mg/dL — AB (ref 65–99)
Potassium: 3.9 mmol/L (ref 3.5–5.1)
Sodium: 141 mmol/L (ref 135–145)

## 2014-11-29 LAB — GLUCOSE, CAPILLARY
GLUCOSE-CAPILLARY: 99 mg/dL (ref 65–99)
Glucose-Capillary: 112 mg/dL — ABNORMAL HIGH (ref 65–99)

## 2014-11-29 MED ORDER — LISINOPRIL 5 MG PO TABS: 40.0000 mg | ORAL_TABLET | Freq: Every day | ORAL | Status: DC

## 2014-11-29 MED ORDER — CLOPIDOGREL BISULFATE 75 MG PO TABS
75.0000 mg | ORAL_TABLET | Freq: Every day | ORAL | Status: DC
Start: 2014-11-29 — End: 2015-06-14

## 2014-11-29 MED ORDER — LISINOPRIL 5 MG PO TABS: 5.0000 mg | ORAL_TABLET | Freq: Every day | ORAL | Status: DC

## 2014-11-29 NOTE — Telephone Encounter (Signed)
New message    TCM appt 7.15.2016 @ 8:30 am   Per [name] Pa

## 2014-11-29 NOTE — Care Management Note (Addendum)
Case Management Note  Patient Details  Name: Kara BrunnerBeatrice S Santiago MRN: [id number] Date of Birth: February 26, 1926  Subjective/Objective:  Pt admitted with severe aortic stenosis                  Action/Plan: Pt is independent from home with daughter.  CM will continue to monitor for disposition plan   Expected Discharge Date:  11/29/14               Expected Discharge Plan:  Home/Self Care  In-House Referral:     Discharge planning Services  CM Consult  Post Acute Care Choice:    Choice offered to:     DME Arranged:    DME Agency:     HH Arranged:    HH Agency:     Status of Service:  Complete, will sign off  Medicare Important Message Given:  Yes-second notification given Date Medicare IM Given:    Medicare IM give by:    Date Additional Medicare IM Given:    Additional Medicare Important Message give by:     If discussed at Long Length of Stay Meetings, dates discussed:    Additional Comments: CM assessed pt, pt already has walker and cane at home, no additional discharge needs identified. Cherylann ParrClaxton, Marca Gadsby S, RN 11/29/2014, 11:42 AM

## 2014-11-29 NOTE — Discharge Summary (Signed)
Discharge Summary   Patient ID: Kara BrunnerBeatrice S Matheson,  MRN: [id number], DOB/AGE: [birth year] 81 y.o.  Admit date: 11/26/2014 Discharge date: 11/29/2014  Primary Care Provider: NIFONG,TED Primary Cardiologist: B. Jens Somrenshaw, MD / Judie PetitM. Excell Seltzerooper, MD   Discharge Diagnoses Principal Problem:   Severe aortic stenosis  **  Status post transcatheter aortic valve replacement (TAVR) using bioprosthesis (Edwards Sapien 3 THV (23 mm). Active Problems:   CAD (coronary artery disease)  **Patent LAD/LCX stents by cath in 09/2014.   Type II diabetes mellitus   HYPERTENSION, BENIGN   CVD (cerebrovascular disease)   Acute on chronic combined systolic and diastolic congestive heart failure  **EF 45-50% w/ Gr 1 DD by echo this admission.  **Discharge weight 110 lbs.   Hyperlipidemia   Peripheral vascular disease   Carotid artery occlusion  Allergies No Known Allergies  Procedures  Transcatheter Aortic Valve Replacement 7.5.2016  Edwards Sapien 3 THV (size 23 mm)   Pre-operative Echo Findings:  Severe aortic stenosis  Mild global left ventricular systolic dysfunction  3+ MR  Post-operative Echo Findings:  No paravalvular leak  Improved left ventricular systolic function  MR unchanged _____________   2D Echocardiogram 7.6.2016  Study Conclusions  - Left ventricle: The cavity size was normal. Systolic function was   mildly reduced. The estimated ejection fraction was in the range   of 45% to 50%. Wall motion was normal; there were no regional   wall motion abnormalities. Doppler parameters are consistent with   abnormal left ventricular relaxation (grade 1 diastolic   dysfunction). Doppler parameters are consistent with high   ventricular filling pressure. - Ventricular septum: Septal motion showed abnormal function and   dyssynergy. - Aortic valve: Transvalvular velocity was minimally increased.   There was no stenosis. - Mitral valve: There was mild regurgitation. - Left atrium:  The atrium was mildly dilated. - Pulmonary arteries: Systolic pressure was mildly increased. PA   peak pressure: 41 mm Hg (S). - Pericardium, extracardiac: A small pericardial effusion was   identified along the left ventricular free wall. Features were   not consistent with tamponade physiology. _____________   History of Present Illness  81 y/o female with a h/o CAD s/p DES to the LAD and LCX in 03/2007, HTN, HL, DM, PVD, cerebrovascular disease, and severe, progressive symptomatic Aortic Stenosis and moderate mitral regurgitation.  Due to progressive dyspnea and exertional chest pain, she was seen in the office in May and subsequently underwent diagnostic catheterization revealing stable coronary anatomy with patent LCX and LAD stents and otherwise non-obstructive CAD.  Echo was also performed and revealed severe Aortic Stenosis with a mean gradient of 34 mmHg, and a peak gradient of 57 mmHg.  She was then referred to the multidisciplinary valve clinic for consideration of aortic valve replacement.  She was felt to be a good candidate for TAVR via a femoral cutdown approach.  Hospital Course  Patient [facility] and underwent successful Transcatheter Aortic Valve Replacement with an Edwards Sapien 3 THV (23 mm) via a left femoral cutdown approach.  She tolerated the procedure well and was monitored in the cardiothoracic ICU postoperatively where she remained hemodynamically stable.  She was transferred out to the floor late on 7/6 and invasive monitoring was discontinued.  A follow-up echocardiogram was performed on 7/6 and revealed an EF of 45-50% with minimally increased transvalvular velocity and no stenosis.  She was felt to have mild volume overload and was treated with oral lasix x 1 day.  Her blood pressure was somewhat soft and her previous dose of lisinopril therapy was reduced.  On 7/7, she ambulated multiple times without significant limitations.  She did complain  of a non-productive cough and was noted to have a low-grade fever however CXR and UA were within normal limits.  Ms. Semel is felt to be ready for discharge this morning.  We have arranged for follow-up in our clinic in 7 days.  She will subsequently follow-up with Dr. Excell Seltzer in TAVR clinic with an echo on the same day on 8/19.  Discharge Vitals Blood pressure 137/42, pulse 65, temperature 98 F (36.7 C), temperature source Oral, resp. rate 18, weight 110 lb 14.3 oz (50.3 kg), SpO2 97 %.  Filed Weights   11/27/14 0600 11/28/14 0552 11/29/14 0401  Weight: 108 lb 8 oz (49.215 kg) 111 lb 8.8 oz (50.6 kg) 110 lb 14.3 oz (50.3 kg)    Labs  CBC  Recent Labs  11/27/14 0541 11/28/14 0958  WBC 6.6 7.4  HGB 11.2* 11.6*  HCT 34.5* 34.5*  MCV 95.0 95.3  PLT 180 158   Basic Metabolic Panel  Recent Labs  11/27/14 0541 11/28/14 0958 11/29/14 0308  NA 142 138 141  K 3.8 3.6 3.9  CL 111 105 107  CO2 24 28 29   GLUCOSE 112* 180* 116*  BUN 6 9 10   CREATININE 0.82 1.05* 0.85  CALCIUM 8.2* 8.3* 8.4*  MG 1.7  --   --    Disposition  Pt is being discharged home today in good condition.  Follow-up Plans & Appointments      Follow-up Information    Follow up with Tonny Bollman, MD On 01/10/2015.   Specialty:  Cardiology   Why:  10:30 AM - echocardiogram; 11:30 AM - Appt with Dr. Excell Seltzer.   Contact information:   [address] [place] [address] [phone]       Follow up with Norma Fredrickson, NP On 12/06/2014.   Specialties:  Nurse Practitioner, Interventional Cardiology, Cardiology, Radiology   Why:  8:30 AM   Contact information:   [address] [place] [address] [phone]       Discharge Medications    Medication List    TAKE these medications        aspirin 81 MG tablet  Take 81 mg by mouth daily.     atorvastatin 80 MG tablet  Commonly known as:  LIPITOR  TAKE 1 TABLET (80 MG TOTAL) BY MOUTH DAILY.      clopidogrel 75 MG tablet  Commonly known as:  PLAVIX  Take 1 tablet (75 mg total) by mouth daily with breakfast.     glipiZIDE 5 MG tablet  Commonly known as:  GLUCOTROL  Take 5 mg by mouth daily.     ibuprofen 200 MG tablet  Commonly known as:  ADVIL,MOTRIN  Take 200 mg by mouth every 6 (six) hours as needed for mild pain or moderate pain.     lisinopril 5 MG tablet  Commonly known as:  PRINIVIL,ZESTRIL  Take 1 tablet (5 mg total) by mouth daily.     metoprolol tartrate 25 MG tablet  Commonly known as:  LOPRESSOR  Take 1 tablet (25 mg total) by mouth 2 (two) times daily.     ranitidine 150 MG tablet  Commonly known as:  ZANTAC  Take 300 mg by mouth 2 (two) times daily as needed for heartburn.        Outstanding Labs/Studies  F/U echo on 8/19.  Duration of Discharge Encounter   Greater than 30 minutes including physician time.  Signed, Nicolasa Ducking NP 11/29/2014, 10:11 AM

## 2014-11-29 NOTE — Progress Notes (Signed)
Patient Name: Kara Santiago Date of Encounter: 11/29/2014    Principal Problem:   Severe aortic stenosis Active Problems:   Status post transcatheter aortic valve replacement (TAVR) using bioprosthesis   CAD (coronary artery disease)   Type II diabetes mellitus   HYPERTENSION, BENIGN   CVD (cerebrovascular disease)   Acute on chronic combined systolic and diastolic congestive heart failure   Hyperlipidemia   Peripheral vascular disease   Carotid artery occlusion    SUBJECTIVE  Feels good this morning.  Good spirits.  Walked 3x yesterday w/o significant dyspnea.  No chest pain.  Left groin feels good.  CURRENT MEDS . acetaminophen  1,000 mg Oral 4 times per day   Or  . acetaminophen (TYLENOL) oral liquid 160 mg/5 mL  1,000 mg Per Tube 4 times per day  . antiseptic oral rinse  7 mL Mouth Rinse BID  . aspirin EC  81 mg Oral Daily  . atorvastatin  80 mg Oral q1800  . clopidogrel  75 mg Oral Q breakfast  . insulin aspart  0-15 Units Subcutaneous TID WC  . lisinopril  5 mg Oral Daily  . metoprolol tartrate  12.5 mg Oral BID  . pantoprazole  40 mg Oral Daily  . sodium chloride  3 mL Intravenous Q12H    OBJECTIVE  Filed Vitals:   11/28/14 1037 11/28/14 1512 11/28/14 2116 11/29/14 0401  BP: 121/68 135/48 123/41 137/42  Pulse: 68 81 74 65  Temp:   99 F (37.2 C) 98 F (36.7 C)  TempSrc:   Oral Oral  Resp:  20 18 18   Weight:    110 lb 14.3 oz (50.3 kg)  SpO2:  99% 97% 97%    Intake/Output Summary (Last 24 hours) at 11/29/14 0743 Last data filed at 11/28/14 1700  Gross per 24 hour  Intake    720 ml  Output      0 ml  Net    720 ml   Filed Weights   11/27/14 0600 11/28/14 0552 11/29/14 0401  Weight: 108 lb 8 oz (49.215 kg) 111 lb 8.8 oz (50.6 kg) 110 lb 14.3 oz (50.3 kg)    PHYSICAL EXAM  General: Pleasant, NAD. Neuro: Alert and oriented X 3. Moves all extremities spontaneously. Psych: Normal affect. HEENT:  Normal  Neck: Supple without bruits or  JVD. Lungs:  Resp regular and unlabored, CTA. Heart: RRR no s3, s4, 2/6 SEM rusb. Abdomen: Soft, non-tender, non-distended, BS + x 4.  Extremities: No clubbing, cyanosis or edema. DP/PT/Radials 1+ and equal bilaterally.  Left groin  Accessory Clinical Findings  CBC  Recent Labs  11/27/14 0541 11/28/14 0958  WBC 6.6 7.4  HGB 11.2* 11.6*  HCT 34.5* 34.5*  MCV 95.0 95.3  PLT 180 158   Basic Metabolic Panel  Recent Labs  11/27/14 0541 11/28/14 0958 11/29/14 0308  NA 142 138 141  K 3.8 3.6 3.9  CL 111 105 107  CO2 24 28 29   GLUCOSE 112* 180* 116*  BUN 6 9 10   CREATININE 0.82 1.05* 0.85  CALCIUM 8.2* 8.3* 8.4*  MG 1.7  --   --    TELE  Rsr, freq pvc's.  Radiology/Studies  Dg Chest 2 View  11/21/2014   CLINICAL DATA:  Preoperative exam prior to valvuloplasty for aortic stenosis  EXAM: CHEST  2 VIEW  COMPARISON:  None.  FINDINGS: The heart size is at upper limits of normal. Both lungs are clear. The visualized skeletal structures are unremarkable.  IMPRESSION:  No active cardiopulmonary disease.   Electronically Signed   By: Christiana PellantGretchen  Green M.D.   On: 11/21/2014 15:05   Dg Chest Port 1 View  11/27/2014   CLINICAL DATA:  Status post aortic valve replacement.  EXAM: PORTABLE CHEST - 1 VIEW  COMPARISON:  11/26/2014  FINDINGS: Right jugular Swan-Ganz catheter remains in place with tip overlying the pulmonary outflow tract. Additional right jugular central venous catheter terminates over the cavoatrial junction. Sequelae of aortic valve replacement are again identified. Thoracic aortic calcification is noted. Cardiac silhouette remains mildly enlarged. Pulmonary vascular congestion on the prior study has largely resolved. Left basilar opacity is stable to minimally increased from the prior study, most compatible with atelectasis. No overt pulmonary edema, sizable pleural effusion, or pneumothorax is identified.  IMPRESSION: Decreased pulmonary vascular congestion.  Left basilar  atelectasis.   Electronically Signed   By: Sebastian AcheAllen  Grady   On: 11/27/2014 07:30   Dg Chest Port 1 View  11/26/2014   CLINICAL DATA:  Status post aortic valve replacement  EXAM: PORTABLE CHEST - 1 VIEW  COMPARISON:  11/21/2014  FINDINGS: Aortic valve replacement identified in anticipated position. Right internal jugular central line is seen with tip over the proximal right main pulmonary artery. Second internal jugular line on the right projects with tip just into the right atrium.  Stable mild cardiac enlargement. There is mild to moderate vascular congestion and mild interstitial prominence. No pneumothorax.  IMPRESSION: Postoperative findings as described above. Mild postoperative pulmonary edema.   Electronically Signed   By: Esperanza Heiraymond  Rubner M.D.   On: 11/26/2014 16:24    ASSESSMENT AND PLAN  1. Severe Aortic Stenosis s/p TAVR Randa Evens(Edwards Sapien 3 THV - 23mm): S/P TAVR 7/5 via left femoral approach. Doing well w/ stable hemodynamics and w/o chest pain or dyspnea. Echo 7/6 showed minimally increased transvalvular velocity. Euvolemic on exam. Walked 3x yesterday w/o significant limitations.  Eager to go home today. Cont asa/plavix.  F/U in office in 1 wk with plan for repeat echo on 8/19 and f/u with Dr. Excell Seltzerooper @ that time.  2. Low-grade fever: Continued to have a low grade fever yuesterday - 99 last night.  98 this AM. She's had a non-productive cough but cxr yesterday did not show acute infxn. WBC have been nl. UA neg.   3. CAD: No recurrent c/p/indigestion. Recent cath in May revealed patent LAD/LCX stents with otw minimal nonobs dzs. Cont asa, statin, bb.  4. HL: LDL 91 in 02/2014. Nl LFT's on 6/30. Cont high potency statin therapy.  5. Type II DM: Glucose 103 this AM. Cont SSI. On glipizide @ home.  6. Essential HTN: BP stable.  Lasix d/c'd and acei dose reduced yesterday.  Cont current doses of bb/acei.  7. Acute on chronic combined systolic and diastolic heart failure:  Likely related to valvular heart disease with severe AS and moderate-severe MR. Euvolemic on exam. No dyspnea/orthopnea. Echo with EF 45-50% - slightly better than last echo in May. Lasix d/c'd yesterday.  Renal fxn stable. Cont bb/acei.  Signed, Nicolasa Duckinghristopher Berge NP  Patient seen, examined. Available data reviewed. Agree with findings, assessment, and plan as outlined by [name], NP. I agree with the exam as documented. The patient has a grade 2/6 ejection murmur at the right upper sternal border. There is no peripheral edema. Her lung fields are clear. Medications reviewed and agree with her medical program as outlined. She will resume glipizide at home. Will continue current doses of lisinopril and metoprolol. Follow-up  as outlined. Stable for discharge. Plan reviewed with patient and her daughter who is at the bedside.  Tonny Bollman, M.D. 11/29/2014 10:03 AM

## 2014-11-29 NOTE — Progress Notes (Signed)
Pt sts she walked 350 ft independently this am. Walked x3 yesterday. Declined walking again. Daughter present. Ed completed, [facility] and will send referral to [place].  [id number] Ethelda ChickKristan Rishi Santiago CES, ACSM 10:06 AM 11/29/2014

## 2014-11-29 NOTE — Discharge Instructions (Signed)
**  PLEASE REMEMBER TO BRING ALL OF YOUR MEDICATIONS TO EACH OF YOUR FOLLOW-UP OFFICE VISITS. ° °NO HEAVY LIFTING X 4 WEEKS. °NO SEXUAL ACTIVITY X 4 WEEKS. °NO DRIVING X 2 WEEKS. °NO SOAKING BATHS, HOT TUBS, POOLS, ETC., X 7 DAYS. ° °Groin Site Care °Refer to this sheet in the next few weeks. These instructions provide you with information on caring for yourself after your procedure. Your caregiver may also give you more specific instructions. Your treatment has been planned according to current medical practices, but problems sometimes occur. Call your caregiver if you have any problems or questions after your procedure. °HOME CARE INSTRUCTIONS °· You may shower 24 hours after the procedure. Remove the bandage (dressing) and gently wash the site with plain soap and water. Gently pat the site dry.  °· Do not apply powder or lotion to the site.  °· Do not sit in a bathtub, swimming pool, or whirlpool for 5 to 7 days.  °· No bending, squatting, or lifting anything over 10 pounds (4.5 kg) as directed by your caregiver.  °· Inspect the site at least twice daily.  °· Do not drive home if you are discharged the same day of the procedure. Have someone else drive you.  ° °What to expect: °· Any bruising will usually fade within 1 to 2 weeks.  °· Blood that collects in the tissue (hematoma) may be painful to the touch. It should usually decrease in size and tenderness within 1 to 2 weeks.  °SEEK IMMEDIATE MEDICAL CARE IF: °· You have unusual pain at the groin site or down the affected leg.  °· You have redness, warmth, swelling, or pain at the groin site.  °· You have drainage (other than a small amount of blood on the dressing).  °· You have chills.  °· You have a fever or persistent symptoms for more than 72 hours.  °· You have a fever and your symptoms suddenly get worse.  °· Your leg becomes pale, cool, tingly, or numb.  °You have heavy bleeding from the site. Hold pressure on the site. . ° °

## 2014-11-29 NOTE — Progress Notes (Signed)
    S: CTSP 2/2 mild itching and redness over back, torso, arms.  Pt says that she first noticed mild itching yesterday but hasn't thought much about it.  Skin is dry.  Her dtr noted that her skin looks more red than usual.  No hives.  O:   Filed Vitals:   11/29/14 1033  BP: 140/56  Pulse:   Temp:   Resp:   Pleasant, nad, aaox3. Skin dry with mild erythema noted over back and abdomen.  No hives.   A/P: 1.  Skin itching/redness:  Pt first noticed mild itching this AM.  Her skin is very dry on exam.  She does not have any hives.  Only new med is plavix.  I rec that she should go home today and shower with her usual toiletries and skin lotion.  Cont asa/plavix.  If itching worsens despite skin hydration and/or she develops hives, she may take prn benadryl but will need to call us as she will likely have to stop plavix and go with ASA only.  I discussed with Dr. Excell Seltzerooper and she would not be a good candidate for either brilinta or effient.   Nicolasa Duckinghristopher Lowana Hable, NP

## 2014-11-29 NOTE — Care Management (Signed)
Important Message  Patient Details  Name: Kara BrunnerBeatrice S Santiago MRN: [id number] Date of Birth: October 23, 1925   Medicare Important Message Given:  Yes-second notification given    Kyla BalzarineShealy, Darrelle Wiberg Abena 11/29/2014, 10:01 AM

## 2014-12-02 NOTE — Telephone Encounter (Signed)
Left message to call back  

## 2014-12-02 NOTE — Telephone Encounter (Signed)
Follow UP  Pt returning Jennifer's phone call 

## 2014-12-02 NOTE — Telephone Encounter (Signed)
Patient contacted regarding discharge [facility] on November 29, 2014.  Patient understands to follow up with provider Norma FredricksonLori Gerhardt, NP on December 06, 2014 at 8:30AM at [facility]. Patient understands discharge instructions? yes Patient understands medications and regiment? yes Patient understands to bring all medications to this visit? yes

## 2014-12-06 ENCOUNTER — Encounter: Payer: Self-pay | Admitting: Nurse Practitioner

## 2014-12-06 ENCOUNTER — Ambulatory Visit (INDEPENDENT_AMBULATORY_CARE_PROVIDER_SITE_OTHER): Payer: Medicare Other | Admitting: Nurse Practitioner

## 2014-12-06 VITALS — BP 180/80 | HR 57 | Ht 60.0 in | Wt 106.2 lb

## 2014-12-06 DIAGNOSIS — Z954 Presence of other heart-valve replacement: Secondary | ICD-10-CM

## 2014-12-06 DIAGNOSIS — R21 Rash and other nonspecific skin eruption: Secondary | ICD-10-CM | POA: Diagnosis not present

## 2014-12-06 DIAGNOSIS — I5043 Acute on chronic combined systolic (congestive) and diastolic (congestive) heart failure: Secondary | ICD-10-CM | POA: Diagnosis not present

## 2014-12-06 DIAGNOSIS — Z952 Presence of prosthetic heart valve: Secondary | ICD-10-CM

## 2014-12-06 DIAGNOSIS — I10 Essential (primary) hypertension: Secondary | ICD-10-CM

## 2014-12-06 LAB — BASIC METABOLIC PANEL
BUN: 9 mg/dL (ref 6–23)
CO2: 30 mEq/L (ref 19–32)
Calcium: 9.3 mg/dL (ref 8.4–10.5)
Chloride: 105 mEq/L (ref 96–112)
Creatinine, Ser: 0.71 mg/dL (ref 0.40–1.20)
GFR: 82.74 mL/min (ref >60.00)
Glucose, Bld: 90 mg/dL (ref 70–99)
Potassium: 3.8 mEq/L (ref 3.5–5.1)
Sodium: 141 mEq/L (ref 135–145)

## 2014-12-06 LAB — CBC
HCT: 36.2 % (ref 36.0–46.0)
Hemoglobin: 11.9 g/dL — ABNORMAL LOW (ref 12.0–15.0)
MCHC: 32.8 g/dL (ref 30.0–36.0)
MCV: 93.7 fl (ref 78.0–100.0)
Platelets: 411 10*3/uL — ABNORMAL HIGH (ref 150.0–400.0)
RBC: 3.87 Mil/uL (ref 3.87–5.11)
RDW: 15.8 % — ABNORMAL HIGH (ref 11.5–15.5)
WBC: 8.3 10*3/uL (ref 4.0–10.5)

## 2014-12-06 MED ORDER — LISINOPRIL 40 MG PO TABS: 20.0000 mg | ORAL_TABLET | Freq: Every day | ORAL | Status: DC

## 2014-12-06 NOTE — Progress Notes (Signed)
CARDIOLOGY OFFICE NOTE  Date:  12/06/2014    Kara Santiago Date of Birth: May 10, 1926 Medical Record #[id number]  PCP:  Stefanie Libel, MD  Cardiologist:  Rachael Fee    Chief Complaint  Patient presents with  . FU for post TAVR    Seen for Dr. Excell Seltzer and Dr. Jens Som    History of Present Illness: Kara Santiago is a 81 y.o. female who presents today for a post hospital visit/TOC. Seen for Dr. Excell Seltzer and Dr. Jens Som. She has a hx of CAD s/p DES to LAD and CFX in 03/2007, valvular heart disease with mod to severe AS, mild AI and mod MR and carotid stenosis. Last seen by Dr. Olga Millers 02/28/14. FU echo in 02/2014 demonstrated mod to severe AS. Mean gradient was 32 mmHg (mean gradient by echo in 8/14 was 34 mmHg).   Due to progressive dyspnea and exertional chest pain, she was seen in the office in May and subsequently underwent diagnostic catheterization revealing stable coronary anatomy with patent LCX and LAD stents and otherwise non-obstructive CAD. Echo was also performed and revealed severe Aortic Stenosis with a mean gradient of 34 mmHg, and a peak gradient of 57 mmHg. She was then referred to the multidisciplinary valve clinic for consideration of aortic valve replacement. She was felt to be a good candidate for TAVR via a femoral cutdown approach.  Patient presented to [facility] on 11/26/2014 and underwent successful Transcatheter Aortic Valve Replacement with an Edwards Sapien 3 THV (23 mm) via a left femoral cutdown approach. She tolerated the procedure well and was monitored in the cardiothoracic ICU postoperatively where she remained hemodynamically stable. A follow-up echocardiogram was performed on 7/6 and revealed an EF of 45-50% with minimally increased transvalvular velocity and no stenosis. She was felt to have mild volume overload and was treated with oral lasix x 1 day. Her blood pressure was somewhat soft and her previous dose of lisinopril  therapy was reduced.  Comes in today. Here with her daughter. Doing well. Has had a rash  -sunburn like -  with the Plavix - now taking with Benedryl. Her dose of ACE was cut back - BP now pretty high. No medicines today. Daughter notes that in the remote past that she had a bad reaction to IVP dye. This may be what this is instead of Plavix. It is improving. BP running high at home. Her breathing has improved. No chest pain. Increasing her activities as tolerated. She has no real complaint.   Past Medical History  Diagnosis Date  . Severe aortic stenosis     a. 09/2014 Echo: EF 40%, gr2 DD, sev AS (mean 34 mmHg, peak 57 mmHg), mod MR, mild LAE, nl RV, mild TR, triv PI, PASP 41 mmHg;  b. 11/2014 TAVR: Randa Evens Sapien 3 THV (size 23mm) via L femoral cutdown;  c. 11/2014 postop Echo: EF 45-50%, no rwma, Gr 1 DD, minimally increased tranvsvalvular velocity, no AI/AS, mild MR, mildly dil LA, PASP .  Marland Santiago CAD (coronary artery disease)     a. 09/2014 Cath: patent LAD/LCX stents, otw nonobs dzs.  . CVD (cerebrovascular disease)   . Hyperlipidemia   . Hypertension   . PVD (peripheral vascular disease)   . Mitral regurgitation   . Aortic insufficiency   . Carotid artery occlusion   . Diabetes mellitus type 2, controlled   . Shortness of breath dyspnea   . GERD (gastroesophageal reflux disease)   . History of hiatal hernia   .  Arthritis     told that she has OA- pt. reports that she doesn't have any pain from it but she reports that she has back pain from time to time     Past Surgical History  Procedure Laterality Date  . Tympanomastoidectomy  2002    right  . Stents  2008  . Abdominal hysterectomy      vaginal approach, partial   . Cardiac catheterization  04/07/07    By Dr. Excell Seltzer -- 1. Severe LAD stenosis with successful PCI using a drug-eluting stent. 2. Severe left circumflex stenosis with successful PCI using a single  drug-eluting stent.  . Cardiac catheterization N/A 10/07/2014     Procedure: Right/Left Heart Cath and Coronary Angiography;  Surgeon: Tonny Bollman, MD;  Location: [facility] INVASIVE CV LAB;  Service: Cardiovascular;  Laterality: N/A;  . Eye surgery      bilateral cataracts removed & /w IOL  . Transcatheter aortic valve replacement, transfemoral N/A 11/26/2014    Procedure: TRANSCATHETER AORTIC VALVE REPLACEMENT, TRANSFEMORAL;  Surgeon: Tonny Bollman, MD;  Location: [facility] OR;  Service: Open Heart Surgery;  Laterality: N/A;  . Tee without cardioversion N/A 11/26/2014    Procedure: TRANSESOPHAGEAL ECHOCARDIOGRAM (TEE);  Surgeon: Tonny Bollman, MD;  Location: [facility] OR;  Service: Open Heart Surgery;  Laterality: N/A;     Medications: Current Outpatient Prescriptions  Medication Sig Dispense Refill  . aspirin 81 MG tablet Take 81 mg by mouth daily.      Marland Santiago atorvastatin (LIPITOR) 80 MG tablet TAKE 1 TABLET (80 MG TOTAL) BY MOUTH DAILY. 90 tablet 2  . clopidogrel (PLAVIX) 75 MG tablet Take 1 tablet (75 mg total) by mouth daily with breakfast. 30 tablet 5  . diphenhydrAMINE (BENADRYL) 25 MG tablet Take 25 mg by mouth at bedtime.    Marland Santiago glipiZIDE (GLUCOTROL) 5 MG tablet Take 5 mg by mouth daily.      Marland Santiago ibuprofen (ADVIL,MOTRIN) 200 MG tablet Take 200 mg by mouth every 6 (six) hours as needed for mild pain or moderate pain.    . metoprolol tartrate (LOPRESSOR) 25 MG tablet Take 1 tablet (25 mg total) by mouth 2 (two) times daily. 180 tablet 1  . ranitidine (ZANTAC) 150 MG tablet Take 300 mg by mouth 2 (two) times daily as needed for heartburn.    Marland Santiago lisinopril (PRINIVIL,ZESTRIL) 40 MG tablet Take 0.5 tablets (20 mg total) by mouth daily. 90 tablet 3   No current facility-administered medications for this visit.    Allergies: No Known Allergies  Social History: The patient  reports that she quit smoking about 8 years ago. Her smoking use included Cigarettes. She has a 2 pack-year smoking history. She has never used smokeless tobacco. She reports that she drinks alcohol. She  reports that she does not use illicit drugs.   Family History: The patient's family history includes Heart attack in her brother, mother, sister, and son; Heart disease in her brother, mother, sister, and son; Pneumonia in an other family member; Stroke in her maternal uncle.   Review of Systems: Please see the history of present illness.   Otherwise, the review of systems is positive for none.   All other systems are reviewed and negative.   Physical Exam: VS:  BP 180/80 mmHg  Pulse 57  Ht 5' (1.524 m)  Wt 106 lb 3.2 oz (48.172 kg)  BMI 20.74 kg/m2 .  BMI Body mass index is 20.74 kg/(m^2).  Wt Readings from Last 3 Encounters:  12/06/14 106 lb 3.2  oz (48.172 kg)  11/29/14 110 lb 14.3 oz (50.3 kg)  11/21/14 109 lb (49.442 kg)    General: Pleasant. She is thin. She is in no acute distress.  HEENT: Normal. Neck: Supple, no JVD, carotid bruits, or masses noted.  Cardiac: Regular rate and rhythm. Outflow murmur noted. No edema.  Respiratory:  Lungs are clear to auscultation bilaterally with normal work of breathing.  GI: Soft and nontender.  MS: No deformity or atrophy. Gait and ROM intact. Skin: Warm and dry. Color is normal. Her left groin looks good. She has a very fine red rash - looks like sunburn on her lower back. She is quite tanned.  Neuro:  Strength and sensation are intact and no gross focal deficits noted.  Psych: Alert, appropriate and with normal affect.   LABORATORY DATA:  EKG:  EKG is ordered today. This demonstrates sinus brady with LBBB.   Lab Results  Component Value Date   WBC 7.4 11/28/2014   HGB 11.6* 11/28/2014   HCT 34.5* 11/28/2014   PLT 158 11/28/2014   GLUCOSE 116* 11/29/2014   CHOL 177 02/28/2014   TRIG 142 02/28/2014   HDL 58 02/28/2014   LDLDIRECT 151.7 12/22/2012   LDLCALC 91 02/28/2014   ALT 14 11/21/2014   AST 22 11/21/2014   NA 141 11/29/2014   K 3.9 11/29/2014   CL 107 11/29/2014   CREATININE 0.85 11/29/2014   BUN 10 11/29/2014    CO2 29 11/29/2014   TSH 2.119 Test methodology is 3rd generation TSH 04/07/2007   INR 1.26 11/26/2014   HGBA1C 5.9* 11/21/2014    BNP (last 3 results) No results for input(s): BNP in the last 8760 hours.  ProBNP (last 3 results) No results for input(s): PROBNP in the last 8760 hours.   Other Studies Reviewed Today:   2D Echocardiogram 7.6.2016  Study Conclusions  - Left ventricle: The cavity size was normal. Systolic function was mildly reduced. The estimated ejection fraction was in the range of 45% to 50%. Wall motion was normal; there were no regional wall motion abnormalities. Doppler parameters are consistent with abnormal left ventricular relaxation (grade 1 diastolic dysfunction). Doppler parameters are consistent with high ventricular filling pressure. - Ventricular septum: Septal motion showed abnormal function and dyssynergy. - Aortic valve: Transvalvular velocity was minimally increased. There was no stenosis. - Mitral valve: There was mild regurgitation. - Left atrium: The atrium was mildly dilated. - Pulmonary arteries: Systolic pressure was mildly increased. PA peak pressure: 41 mm Hg (S). - Pericardium, extracardiac: A small pericardial effusion was identified along the left ventricular free wall. Features were not consistent with tamponade physiology.   Carotid US 9/15 RICA < 40% LICA 40-59%  Nuclear 12/29/11 Overall Impression: Normal stress nuclear study LV Ejection Fraction: 60%. LV Wall Motion: NL LV Function; NL Wall Motion  Assessment/Plan:  1. Post TAVR - she is doing well clinically. I have left her on her current regimen. Follow up labs today. She has follow up with Dr. Excell Seltzerooper next month.   2. HTN - BP high at home as well. Increase Lisinopril back to 40 mg - start at just 20 mg for a week - if remaining high - increase back to 40 mg a day.   3. CAD - no active symptoms.   4. Rash - I wonder if this is from the  contrast - sounds like she has had remote reaction in the past. It is certainly improving (which I do not think it would since  she continues to take Plavix). Ok to use Zyrtec/Claritin in the daytime.   5. Carotid disease  Current medicines are reviewed with the patient today.  The patient does not have concerns regarding medicines other than what has been noted above.  The following changes have been made:  See above.  Labs/ tests ordered today include:    Orders Placed This Encounter  Procedures  . Basic metabolic panel  . CBC  . EKG 12-Lead     Disposition:   FU with Dr. Excell Seltzer and Jens Som as planned.   Patient is agreeable to this plan and will call if any problems develop in the interim.   Signed: Rosalio Macadamia, RN, ANP-C 12/06/2014 9:06 AM  [facility] [address] [place], [place]  [address] Phone: [phone] Fax: [phone]

## 2014-12-06 NOTE — Patient Instructions (Addendum)
We will be checking the following labs today - BMET & CBC   Medication Instructions:    Continue with your current medicines but I am  Increasing the Lisinopril back to a 40 mg tablet - take only half - if BP remains high - ok to increase back to 40 mg a day  Ok to use Zyrtec, Claritin prn during the day    Testing/Procedures To Be Arranged:  N/A  Follow-Up:   See Dr. Excell Seltzerooper and Jens Somrenshaw as planned.    Other Special Instructions:   N/A  Call the [facility] office at [phone] if you have any questions, problems or concerns.

## 2014-12-18 ENCOUNTER — Ambulatory Visit (INDEPENDENT_AMBULATORY_CARE_PROVIDER_SITE_OTHER): Payer: Medicare Other | Admitting: Surgery

## 2014-12-18 ENCOUNTER — Encounter: Payer: Self-pay | Admitting: Surgery

## 2014-12-18 VITALS — BP 149/70 | HR 60 | Resp 20 | Ht 60.0 in | Wt 106.0 lb

## 2014-12-18 DIAGNOSIS — Z954 Presence of other heart-valve replacement: Secondary | ICD-10-CM | POA: Diagnosis not present

## 2014-12-18 DIAGNOSIS — I6523 Occlusion and stenosis of bilateral carotid arteries: Secondary | ICD-10-CM

## 2014-12-18 DIAGNOSIS — Z953 Presence of xenogenic heart valve: Secondary | ICD-10-CM

## 2014-12-18 NOTE — Progress Notes (Signed)
      HPI: Patient returns for routine postoperative follow-up having undergone TAVR via a left femoral approach on 11/26/2014. The patient's early postoperative recovery while in the hospital was notable for an uncomplicated postop course. Since hospital discharge the patient reports that she is feeling well. She is walking without chest pain or shortness of breath and is back to driving.   Current Outpatient Prescriptions  Medication Sig Dispense Refill  . aspirin 81 MG tablet Take 81 mg by mouth daily.      Marland Kitchen atorvastatin (LIPITOR) 80 MG tablet TAKE 1 TABLET (80 MG TOTAL) BY MOUTH DAILY. 90 tablet 2  . clopidogrel (PLAVIX) 75 MG tablet Take 1 tablet (75 mg total) by mouth daily with breakfast. 30 tablet 5  . diphenhydrAMINE (BENADRYL) 25 MG tablet Take 25 mg by mouth at bedtime.    Marland Kitchen glipiZIDE (GLUCOTROL) 5 MG tablet Take 5 mg by mouth daily.      Marland Kitchen ibuprofen (ADVIL,MOTRIN) 200 MG tablet Take 200 mg by mouth every 6 (six) hours as needed for mild pain or moderate pain.    Marland Kitchen lisinopril (PRINIVIL,ZESTRIL) 40 MG tablet Take 0.5 tablets (20 mg total) by mouth daily. 90 tablet 3  . metoprolol tartrate (LOPRESSOR) 25 MG tablet Take 1 tablet (25 mg total) by mouth 2 (two) times daily. 180 tablet 1  . ranitidine (ZANTAC) 150 MG tablet Take 300 mg by mouth 2 (two) times daily as needed for heartburn.     No current facility-administered medications for this visit.    Physical Exam: BP 149/70 mmHg  Pulse 60  Resp 20  Ht 5' (1.524 m)  Wt 106 lb (48.081 kg)  BMI 20.70 kg/m2  SpO2 93% She looks well. Lung exam is clear. Cardiac exam shows a regular rate and rhythm with normal heart sounds. The left groin incision is healing well and there is no peripheral edema. dp and pt pulses are palpable but diminished bilaterally.   Diagnostic Tests:  None today  Impression:  Overall she is doing well following TAVR. I told her that she can return to normal activity.  Plan:  She is going to  follow up with Dr. Excell Seltzer in a few weeks and will have a one month echo at that time.   Alleen Borne, MD [facility] [phone]

## 2015-01-10 ENCOUNTER — Ambulatory Visit (INDEPENDENT_AMBULATORY_CARE_PROVIDER_SITE_OTHER): Payer: Medicare Other | Admitting: Cardiovascular Disease

## 2015-01-10 ENCOUNTER — Ambulatory Visit (HOSPITAL_COMMUNITY): Payer: Medicare Other | Attending: Cardiology

## 2015-01-10 ENCOUNTER — Other Ambulatory Visit: Payer: Self-pay

## 2015-01-10 ENCOUNTER — Encounter: Payer: Self-pay | Admitting: Cardiovascular Disease

## 2015-01-10 VITALS — BP 160/90 | HR 58 | Ht 60.0 in | Wt 107.8 lb

## 2015-01-10 DIAGNOSIS — Z954 Presence of other heart-valve replacement: Secondary | ICD-10-CM | POA: Insufficient documentation

## 2015-01-10 DIAGNOSIS — I10 Essential (primary) hypertension: Secondary | ICD-10-CM | POA: Diagnosis not present

## 2015-01-10 DIAGNOSIS — E119 Type 2 diabetes mellitus without complications: Secondary | ICD-10-CM | POA: Insufficient documentation

## 2015-01-10 DIAGNOSIS — I340 Nonrheumatic mitral (valve) insufficiency: Secondary | ICD-10-CM | POA: Insufficient documentation

## 2015-01-10 DIAGNOSIS — Z952 Presence of prosthetic heart valve: Secondary | ICD-10-CM

## 2015-01-10 DIAGNOSIS — I6523 Occlusion and stenosis of bilateral carotid arteries: Secondary | ICD-10-CM | POA: Diagnosis not present

## 2015-01-10 DIAGNOSIS — I350 Nonrheumatic aortic (valve) stenosis: Secondary | ICD-10-CM | POA: Diagnosis present

## 2015-01-10 DIAGNOSIS — E785 Hyperlipidemia, unspecified: Secondary | ICD-10-CM | POA: Insufficient documentation

## 2015-01-10 DIAGNOSIS — I517 Cardiomegaly: Secondary | ICD-10-CM | POA: Diagnosis not present

## 2015-01-10 MED ORDER — LISINOPRIL 40 MG PO TABS: 40.0000 mg | ORAL_TABLET | Freq: Every day | ORAL | Status: DC

## 2015-01-10 NOTE — Progress Notes (Signed)
Cardiology Office Note   Date:  01/10/2015   ID:  Kara Santiago, DOB 03/11/1926, MRN [id number]  PCP:  Stefanie Libel, MD  Cardiologist:  Tonny Bollman, MD    No chief complaint on file.    History of Present Illness: Kara Santiago is a 81 y.o. female who presents for 30 day follow-up after TAVR 11/26/2014 performed via an open left femoral approach. Her post-operative course was uncomplicated.   She is here with her daughter today. Overall doing well. Reports mild dyspnea with exertion. No chest pain. She feels her heart beating. No edema, orthopnea, or PND. Notes easy bruising with plavix. Initially had a rash for several days but this has resolved. Suspect she has a contrast allergy and we have added this to her allergy list today.   Past Medical History  Diagnosis Date  . Severe aortic stenosis     a. 09/2014 Echo: EF 40%, gr2 DD, sev AS (mean 34 mmHg, peak 57 mmHg), mod MR, mild LAE, nl RV, mild TR, triv PI, PASP 41 mmHg;  b. 11/2014 TAVR: Randa Evens Sapien 3 THV (size 23mm) via L femoral cutdown;  c. 11/2014 postop Echo: EF 45-50%, no rwma, Gr 1 DD, minimally increased tranvsvalvular velocity, no AI/AS, mild MR, mildly dil LA, PASP .  Marland Kitchen CAD (coronary artery disease)     a. 09/2014 Cath: patent LAD/LCX stents, otw nonobs dzs.  . CVD (cerebrovascular disease)   . Hyperlipidemia   . Hypertension   . PVD (peripheral vascular disease)   . Mitral regurgitation   . Aortic insufficiency   . Carotid artery occlusion   . Diabetes mellitus type 2, controlled   . Shortness of breath dyspnea   . GERD (gastroesophageal reflux disease)   . History of hiatal hernia   . Arthritis     told that she has OA- pt. reports that she doesn't have any pain from it but she reports that she has back pain from time to time     Past Surgical History  Procedure Laterality Date  . Tympanomastoidectomy  2002    right  . Stents  2008  . Abdominal hysterectomy      vaginal approach, partial     . Cardiac catheterization  04/07/07    By Dr. Excell Seltzer -- 1. Severe LAD stenosis with successful PCI using a drug-eluting stent. 2. Severe left circumflex stenosis with successful PCI using a single  drug-eluting stent.  . Cardiac catheterization N/A 10/07/2014    Procedure: Right/Left Heart Cath and Coronary Angiography;  Surgeon: Tonny Bollman, MD;  Location: [facility] INVASIVE CV LAB;  Service: Cardiovascular;  Laterality: N/A;  . Eye surgery      bilateral cataracts removed & /w IOL  . Transcatheter aortic valve replacement, transfemoral N/A 11/26/2014    Procedure: TRANSCATHETER AORTIC VALVE REPLACEMENT, TRANSFEMORAL;  Surgeon: Tonny Bollman, MD;  Location: [facility] OR;  Service: Open Heart Surgery;  Laterality: N/A;  . Tee without cardioversion N/A 11/26/2014    Procedure: TRANSESOPHAGEAL ECHOCARDIOGRAM (TEE);  Surgeon: Tonny Bollman, MD;  Location: [facility] OR;  Service: Open Heart Surgery;  Laterality: N/A;    Current Outpatient Prescriptions  Medication Sig Dispense Refill  . aspirin 81 MG tablet Take 81 mg by mouth daily.      Marland Kitchen atorvastatin (LIPITOR) 80 MG tablet TAKE 1 TABLET (80 MG TOTAL) BY MOUTH DAILY. 90 tablet 2  . clopidogrel (PLAVIX) 75 MG tablet Take 1 tablet (75 mg total) by mouth daily with breakfast. 30 tablet 5  .  glipiZIDE (GLUCOTROL) 5 MG tablet Take 5 mg by mouth daily.      Marland Kitchen ibuprofen (ADVIL,MOTRIN) 200 MG tablet Take 200 mg by mouth every 6 (six) hours as needed for mild pain or moderate pain.    Marland Kitchen lisinopril (PRINIVIL,ZESTRIL) 40 MG tablet Take 0.5 tablets (20 mg total) by mouth daily. 90 tablet 3  . metoprolol tartrate (LOPRESSOR) 25 MG tablet Take 1 tablet (25 mg total) by mouth 2 (two) times daily. 180 tablet 1  . ranitidine (ZANTAC) 150 MG tablet Take 300 mg by mouth 2 (two) times daily as needed for heartburn.     No current facility-administered medications for this visit.    Allergies:   Review of patient's allergies indicates no known allergies.   Social History:  The  patient  reports that she quit smoking about 8 years ago. Her smoking use included Cigarettes. She has a 2 pack-year smoking history. She has never used smokeless tobacco. She reports that she drinks alcohol. She reports that she does not use illicit drugs.   Family History:  The patient's  family history includes Heart attack in her brother, mother, sister, and son; Heart disease in her brother, mother, sister, and son; Pneumonia in an other family member; Stroke in her maternal uncle.    ROS:  Please see the history of present illness.  Otherwise, review of systems is positive for DOE, easy bruising.  All other systems are reviewed and negative.    PHYSICAL EXAM: VS:  BP 160/90 mmHg  Pulse 58  Ht 5' (1.524 m)  Wt 107 lb 12.8 oz (48.898 kg)  BMI 21.05 kg/m2  SpO2 98% , BMI Body mass index is 21.05 kg/(m^2). GEN: Well nourished, well developed, pleasant elderly woman in no acute distress HEENT: normal Neck: no JVD, no masses.  Cardiac: RRR with soft systolic ejection murmur at the RUSB                Respiratory:  clear to auscultation bilaterally, normal work of breathing GI: soft, nontender, nondistended, + BS MS: no deformity or atrophy Ext: no pretibial edema Skin: warm and dry, no rash Neuro:  Strength and sensation are intact Psych: euthymic mood, full affect  EKG:  EKG is not ordered today.  Recent Labs: 11/21/2014: ALT 14 11/27/2014: Magnesium 1.7 12/06/2014: BUN 9; Creatinine, Ser 0.71; Hemoglobin 11.9*; Platelets 411.0*; Potassium 3.8; Sodium 141   Lipid Panel     Component Value Date/Time   CHOL 177 02/28/2014 1022   TRIG 142 02/28/2014 1022   HDL 58 02/28/2014 1022   CHOLHDL 3.1 02/28/2014 1022   VLDL 28 02/28/2014 1022   LDLCALC 91 02/28/2014 1022   LDLDIRECT 151.7 12/22/2012 0856      Wt Readings from Last 3 Encounters:  01/10/15 107 lb 12.8 oz (48.898 kg)  12/18/14 106 lb (48.081 kg)  12/06/14 106 lb 3.2 oz (48.172 kg)    Cardiac Studies Reviewed: 2D  Echo images personally reviewed. Formal report pending.   ASSESSMENT AND PLAN: Aortic stenosis s/p TAVR. NYHA Functional Class II symptoms. Overall progressing well. Reviewed echo images and her transcatheter heart valve is functioning well with no paravalvular leaks and normal transvalvular gradients (mean 10 mmHg). Reviewed recommendations for SBE prophylaxis, ASA/Plavix x 6 months, and follow-up in one year.   Her BP is elevated and I recommended that she increase lisinopril to 40 mg daily (her preoperative dose).   Current medicines are reviewed with the patient today.  The patient does not have  concerns regarding medicines.  Labs/ tests ordered today include:  No orders of the defined types were placed in this encounter.    Disposition:   FU Dr Jens Som 3 months, FU with me in valve clinic 1 year with an echo.   Enzo Bi, MD  01/10/2015 11:44 AM    [facility] [address] [place], [place], [place]  [address] Phone: [phone]; Fax: [phone]

## 2015-01-10 NOTE — Patient Instructions (Addendum)
Medication Instructions:  Your physician has recommended you make the following change in your medication:  1. INCREASE Lisinopril to  take one by mouth daily  Labwork: No new orders.   Testing/Procedures: Your physician has requested that you have an echocardiogram in 1 YEAR. Echocardiography is a painless test that uses sound waves to create images of your heart. It provides your doctor with information about the size and shape of your heart and how well your heart's chambers and valves are working. This procedure takes approximately one hour. There are no restrictions for this procedure.  Follow-Up: Your physician wants you to follow-up in: 6 MONTHS with Dr Jens Som.  You will receive a reminder letter in the mail two months in advance. If you don't receive a letter, please call our office to schedule the follow-up appointment.  Your physician wants you to follow-up in: 1 YEAR with Dr Excell Seltzer.  You will receive a reminder letter in the mail two months in advance. If you don't receive a letter, please call our office to schedule the follow-up appointment.   Any Other Special Instructions Will Be Listed Below (If Applicable).  Your physician discussed the importance of taking an antibiotic prior to any dental, gastrointestinal, genitourinary procedures to prevent damage to the heart valves from infection. You were given a prescription for an antibiotic based on current SBE prophylaxis guidelines.

## 2015-02-17 ENCOUNTER — Encounter: Payer: Self-pay | Admitting: Family

## 2015-02-18 ENCOUNTER — Encounter: Payer: Self-pay | Admitting: Family

## 2015-02-18 ENCOUNTER — Ambulatory Visit (HOSPITAL_COMMUNITY)
Admission: RE | Admit: 2015-02-18 | Discharge: 2015-02-18 | Disposition: A | Discharge status: Home / Self Care | Payer: Medicare Other | Source: Ambulatory Visit | Attending: Family | Admitting: Family

## 2015-02-18 ENCOUNTER — Ambulatory Visit (INDEPENDENT_AMBULATORY_CARE_PROVIDER_SITE_OTHER): Payer: Medicare Other | Admitting: Family

## 2015-02-18 VITALS — BP 157/66 | HR 53 | Temp 98.2°F | Resp 14 | Ht 60.0 in | Wt 107.0 lb

## 2015-02-18 DIAGNOSIS — I6523 Occlusion and stenosis of bilateral carotid arteries: Secondary | ICD-10-CM

## 2015-02-18 DIAGNOSIS — Z87891 Personal history of nicotine dependence: Secondary | ICD-10-CM

## 2015-02-18 DIAGNOSIS — E119 Type 2 diabetes mellitus without complications: Secondary | ICD-10-CM | POA: Insufficient documentation

## 2015-02-18 DIAGNOSIS — I10 Essential (primary) hypertension: Secondary | ICD-10-CM | POA: Diagnosis not present

## 2015-02-18 DIAGNOSIS — E785 Hyperlipidemia, unspecified: Secondary | ICD-10-CM | POA: Insufficient documentation

## 2015-02-18 NOTE — Addendum Note (Signed)
Addended by: Adria Dill L on: 02/18/2015 02:17 PM   Modules accepted: Orders

## 2015-02-18 NOTE — Patient Instructions (Signed)
Stroke Prevention Some medical conditions and behaviors are associated with an increased chance of having a stroke. You may prevent a stroke by making healthy choices and managing medical conditions. HOW CAN I REDUCE MY RISK OF HAVING A STROKE?   Stay physically active. Get at least 30 minutes of activity on most or all days.  Do not smoke. It may also be helpful to avoid exposure to secondhand smoke.  Limit alcohol use. Moderate alcohol use is considered to be:  No more than 2 drinks per day for men.  No more than 1 drink per day for nonpregnant women.  Eat healthy foods. This involves:  Eating 5 or more servings of fruits and vegetables a day.  Making dietary changes that address high blood pressure (hypertension), high cholesterol, diabetes, or obesity.  Manage your cholesterol levels.  Making food choices that are high in fiber and low in saturated fat, trans fat, and cholesterol may control cholesterol levels.  Take any prescribed medicines to control cholesterol as directed by your health care provider.  Manage your diabetes.  Controlling your carbohydrate and sugar intake is recommended to manage diabetes.  Take any prescribed medicines to control diabetes as directed by your health care provider.  Control your hypertension.  Making food choices that are low in salt (sodium), saturated fat, trans fat, and cholesterol is recommended to manage hypertension.  Take any prescribed medicines to control hypertension as directed by your health care provider.  Maintain a healthy weight.  Reducing calorie intake and making food choices that are low in sodium, saturated fat, trans fat, and cholesterol are recommended to manage weight.  Stop drug abuse.  Avoid taking birth control pills.  Talk to your health care provider about the risks of taking birth control pills if you are over 35 years old, smoke, get migraines, or have ever had a blood clot.  Get evaluated for sleep  disorders (sleep apnea).  Talk to your health care provider about getting a sleep evaluation if you snore a lot or have excessive sleepiness.  Take medicines only as directed by your health care provider.  For some people, aspirin or blood thinners (anticoagulants) are helpful in reducing the risk of forming abnormal blood clots that can lead to stroke. If you have the irregular heart rhythm of atrial fibrillation, you should be on a blood thinner unless there is a good reason you cannot take them.  Understand all your medicine instructions.  Make sure that other conditions (such as anemia or atherosclerosis) are addressed. SEEK IMMEDIATE MEDICAL CARE IF:   You have sudden weakness or numbness of the face, arm, or leg, especially on one side of the body.  Your face or eyelid droops to one side.  You have sudden confusion.  You have trouble speaking (aphasia) or understanding.  You have sudden trouble seeing in one or both eyes.  You have sudden trouble walking.  You have dizziness.  You have a loss of balance or coordination.  You have a sudden, severe headache with no known cause.  You have new chest pain or an irregular heartbeat. Any of these symptoms may represent a serious problem that is an emergency. Do not wait to see if the symptoms will go away. Get medical help at once. Call your local emergency services (911 in [place] Do not drive yourself to the hospital. Document Released: 06/17/2004 Document Revised: 09/24/2013 Document Reviewed: 11/10/2012 [employer] Patient Information 2015 [employer]. This information is not intended to replace advice given   to you by your health care provider. Make sure you discuss any questions you have with your health care provider.  

## 2015-02-18 NOTE — Progress Notes (Signed)
Established Carotid Patient   History of Present Illness  Kara Santiago is a 81 y.o. female patient of Dr. Arbie Cookey who has known carotid artery stenosis returns for scheduled Duplex surveillance. She has not required carotid artery intervention to date.  Dr. Arbie Cookey has reviewed her Duplex results in the past.  Patient has no history of TIA or stroke symptoms. Specifically the patient denies a history of amaurosis fugax or monocular blindness,unilateral facial drooping,  hemiplegia, or receptive or expressive aphasia.   She sees Dr. Jens Som for a history of a known cardiac murmur; she had her aortic valve replaced July 2016, bovine valve. The patient denies New Medical or Surgical History.  Pt Diabetic: Yes , states in good control Pt smoker: former smoker, quit 2008   Pt meds include:  Statin : Yes  Betablocker: Yes  ASA: Yes  Other anticoagulants/antiplatelets: Plavix started after aortic valve replacement July 2016  Past Medical History  Diagnosis Date  . Severe aortic stenosis     a. 09/2014 Echo: EF 40%, gr2 DD, sev AS (mean 34 mmHg, peak 57 mmHg), mod MR, mild LAE, nl RV, mild TR, triv PI, PASP 41 mmHg;  b. 11/2014 TAVR: Randa Evens Sapien 3 THV (size 23mm) via L femoral cutdown;  c. 11/2014 postop Echo: EF 45-50%, no rwma, Gr 1 DD, minimally increased tranvsvalvular velocity, no AI/AS, mild MR, mildly dil LA, PASP .  Marland Kitchen CAD (coronary artery disease)     a. 09/2014 Cath: patent LAD/LCX stents, otw nonobs dzs.  . CVD (cerebrovascular disease)   . Hyperlipidemia   . Hypertension   . PVD (peripheral vascular disease)   . Mitral regurgitation   . Aortic insufficiency   . Carotid artery occlusion   . Diabetes mellitus type 2, controlled   . Shortness of breath dyspnea   . GERD (gastroesophageal reflux disease)   . History of hiatal hernia   . Arthritis     told that she has OA- pt. reports that she doesn't have any pain from it but she reports that she has back pain  from time to time   . CHF (congestive heart failure)     Social History Social History  Substance Use Topics  . Smoking status: Former Smoker -- 0.40 packs/day for 5 years    Types: Cigarettes    Quit date: 05/24/2006  . Smokeless tobacco: Never Used  . Alcohol Use: Yes     Comment: wine- Glass/ per day     Family History Family History  Problem Relation Age of Onset  . Pneumonia    . Heart attack Mother   . Heart disease Mother     Before age 7  . Hyperlipidemia Mother   . Hypertension Mother   . Heart disease Sister     Before age 33  . Hyperlipidemia Sister   . Hypertension Sister   . Heart disease Brother     Before age 21  . Hyperlipidemia Brother   . Heart attack Brother   . Heart disease Son 84    After age 69  . Heart attack Sister   . Heart attack Son   . Stroke Maternal Uncle   . Heart disease Brother     Before age 58-  Stents - CHF    Surgical History Past Surgical History  Procedure Laterality Date  . Tympanomastoidectomy  2002    right  . Stents  2008  . Abdominal hysterectomy      vaginal approach, partial   .  Cardiac catheterization  04/07/07    By Dr. Excell Seltzer -- 1. Severe LAD stenosis with successful PCI using a drug-eluting stent. 2. Severe left circumflex stenosis with successful PCI using a single  drug-eluting stent.  . Cardiac catheterization N/A 10/07/2014    Procedure: Right/Left Heart Cath and Coronary Angiography;  Surgeon: Tonny Bollman, MD;  Location: [facility] INVASIVE CV LAB;  Service: Cardiovascular;  Laterality: N/A;  . Eye surgery      bilateral cataracts removed & /w IOL  . Transcatheter aortic valve replacement, transfemoral N/A 11/26/2014    Procedure: TRANSCATHETER AORTIC VALVE REPLACEMENT, TRANSFEMORAL;  Surgeon: Tonny Bollman, MD;  Location: [facility] OR;  Service: Open Heart Surgery;  Laterality: N/A;  . Tee without cardioversion N/A 11/26/2014    Procedure: TRANSESOPHAGEAL ECHOCARDIOGRAM (TEE);  Surgeon: Tonny Bollman, MD;  Location:  [facility] OR;  Service: Open Heart Surgery;  Laterality: N/A;    Allergies  Allergen Reactions  . Contrast Media [Iodinated Diagnostic Agents] Rash    Blister's    Current Outpatient Prescriptions  Medication Sig Dispense Refill  . aspirin 81 MG tablet Take 81 mg by mouth daily.      Marland Kitchen atorvastatin (LIPITOR) 80 MG tablet TAKE 1 TABLET (80 MG TOTAL) BY MOUTH DAILY. 90 tablet 2  . clopidogrel (PLAVIX) 75 MG tablet Take 1 tablet (75 mg total) by mouth daily with breakfast. 30 tablet 5  . glipiZIDE (GLUCOTROL) 5 MG tablet Take 5 mg by mouth daily.      Marland Kitchen ibuprofen (ADVIL,MOTRIN) 200 MG tablet Take 200 mg by mouth every 6 (six) hours as needed for mild pain or moderate pain.    Marland Kitchen lisinopril (PRINIVIL,ZESTRIL) 40 MG tablet Take 1 tablet (40 mg total) by mouth daily. 90 tablet 3  . metoprolol tartrate (LOPRESSOR) 25 MG tablet Take 1 tablet (25 mg total) by mouth 2 (two) times daily. 180 tablet 1  . ranitidine (ZANTAC) 150 MG tablet Take 300 mg by mouth 2 (two) times daily as needed for heartburn.     No current facility-administered medications for this visit.    Review of Systems : See HPI for pertinent positives and negatives.  Physical Examination  Filed Vitals:   02/18/15 1147 02/18/15 1149 02/18/15 1200 02/18/15 1201  BP: 153/74 152/68 151/64 157/66  Pulse: 51 53 52 53  Temp:  98.2 F (36.8 C)    TempSrc:  Oral    Resp:  14    Height:  5' (1.524 m)    Weight:  107 lb (48.535 kg)    SpO2:  100%     Body mass index is 20.9 kg/(m^2).  General: WDWN female in NAD  GAIT: normal  Eyes: PERRLA  Pulmonary: CTAB, no rales, no rhonchi, & no wheezing.  Cardiac: regular rhythm, no detected murmur.   VASCULAR EXAM  Carotid Bruits  Left  Right    positive negative   LE Pulses  LEFT  RIGHT   FEMORAL  palpable  palpable   POPLITEAL  not palpable  not palpable   POSTERIOR TIBIAL  Not palpable  not palpable   DORSALIS PEDIS  ANTERIOR TIBIAL  palpable  palpable     Gastrointestinal: soft, nontender, BS WNL, no r/g, no masses palpated.  Musculoskeletal: Negative muscle atrophy/wasting. M/S 5/5 throughout Extremities without ischemic changes.  Neurologic: A&O X 3; Appropriate Affect ; SENSATION ;normal; Speech is normal  CN 2-12 intact, Pain and light touch intact in extremities, Motor exam as listed above.  Non-Invasive Vascular Imaging CAROTID DUPLEX 02/18/2015   CEREBROVASCULAR DUPLEX EVALUATION    INDICATION: Carotid artery disease     PREVIOUS INTERVENTION(S):     DUPLEX EXAM:     RIGHT  LEFT  Peak Systolic Velocities (cm/s) End Diastolic Velocities (cm/s) Plaque LOCATION Peak Systolic Velocities (cm/s) End Diastolic Velocities (cm/s) Plaque  82 8  CCA PROXIMAL 98 9   61 8  CCA MID 79 13 HT  68 11 HT CCA DISTAL 65 9 HT  187 0 HT ECA 112 0 HT  106 15 HT ICA PROXIMAL 131 13 HT  88 17  ICA MID 174 32 HT  90 14  ICA DISTAL 93 21     1.73 ICA / CCA Ratio (PSV) 2.20  Antegrade  Vertebral Flow Antegrade   149 Brachial Systolic Pressure (mmHg) 151  Multiphasic (Subclavian artery) Brachial Artery Waveforms Multiphasic (Subclavian artery)    Plaque Morphology:  HM = Homogeneous, HT = Heterogeneous, CP = Calcific Plaque, SP = Smooth Plaque, IP = Irregular Plaque  ADDITIONAL FINDINGS:     IMPRESSION: Bilateral internal carotid artery velocities suggest a <40% stenosis.  Right external carotid artery stenosis.    Compared to the previous exam:  No significant change in comparison to the last exam on 02/08/2014.      Assessment: Kara Santiago is a 81 y.o. female who has no history of stroke or TIA. Carotid duplex today suggests <40% bilateral ICA stenosis. No significant change in comparison to the last exam on 02/08/2014.  Her atherosclerotic risk factors include controlled DM, former smoker, and CAD.   Plan: Follow-up in 1 year with Carotid Duplex.   I discussed in depth with the patient the nature of  atherosclerosis, and emphasized the importance of maximal medical management including strict control of blood pressure, blood glucose, and lipid levels, obtaining regular exercise, and continued cessation of smoking.  The patient is aware that without maximal medical management the underlying atherosclerotic disease process will progress, limiting the benefit of any interventions. The patient was given information about stroke prevention and what symptoms should prompt the patient to seek immediate medical care. Thank you for allowing [place] to participate in this patient's care.  Charisse March, RN, MSN, FNP-C [facility] Office: [phone]  Clinic Physician: Early  02/18/2015 12:25 PM

## 2015-02-18 NOTE — Progress Notes (Signed)
Filed Vitals:   02/18/15 1147 02/18/15 1149 02/18/15 1200 02/18/15 1201  BP: 153/74 152/68 151/64 157/66  Pulse: 51 53 52 53  Temp:  98.2 F (36.8 C)    TempSrc:  Oral    Resp:  14    Height:  5' (1.524 m)    Weight:  107 lb (48.535 kg)    SpO2:  100%

## 2015-06-14 ENCOUNTER — Other Ambulatory Visit: Payer: Self-pay | Admitting: Nurse Practitioner

## 2015-07-02 NOTE — Progress Notes (Signed)
HPI: Follow-up coronary disease and aortic valve replacement. Patient had cardiac catheterization May 2016  That showed patent stents in the LAD and circumflex and no other obstructive disease. Ejection fraction 50%.  Moderate mitral regurgitation and severe aortic stenosis. She underwent TAVR 7/16. Last echocardiogram August 2016 showed ejection fraction 50-55%In grade 1 diastolic dysfunction. There was a prosthetic aortic valve with no aortic insufficiency. There was mild to moderate mitral regurgitation and mild left atrial enlargement. Note CT June 2016 showed apical nodular thickening and follow-up recommended in 6 months. She also has cerebrovascular disease followed by vascular surgery. Since last seen, the patient denies any dyspnea on exertion, orthopnea, PND, pedal edema, palpitations, syncope or chest pain.   Current Outpatient Prescriptions  Medication Sig Dispense Refill  . aspirin 81 MG tablet Take 81 mg by mouth daily.      Marland Kitchen atorvastatin (LIPITOR) 80 MG tablet TAKE 1 TABLET (80 MG TOTAL) BY MOUTH DAILY. 90 tablet 2  . clopidogrel (PLAVIX) 75 MG tablet TAKE 1 TABLET (75 MG TOTAL) BY MOUTH DAILY WITH BREAKFAST. 30 tablet 8  . glipiZIDE (GLUCOTROL) 5 MG tablet Take 5 mg by mouth daily.      Marland Kitchen ibuprofen (ADVIL,MOTRIN) 200 MG tablet Take 200 mg by mouth every 6 (six) hours as needed for mild pain or moderate pain.    Marland Kitchen lisinopril (PRINIVIL,ZESTRIL) 40 MG tablet Take 1 tablet (40 mg total) by mouth daily. 90 tablet 3  . metoprolol tartrate (LOPRESSOR) 25 MG tablet Take 1 tablet (25 mg total) by mouth 2 (two) times daily. 180 tablet 1  . ranitidine (ZANTAC) 150 MG tablet Take 300 mg by mouth 2 (two) times daily as needed for heartburn.     No current facility-administered medications for this visit.     Past Medical History  Diagnosis Date  . Severe aortic stenosis     a. 09/2014 Echo: EF 40%, gr2 DD, sev AS (mean 34 mmHg, peak 57 mmHg), mod MR, mild LAE, nl RV, mild TR, triv  PI, PASP 41 mmHg;  b. 11/2014 TAVR: Randa Evens Sapien 3 THV (size 23mm) via L femoral cutdown;  c. 11/2014 postop Echo: EF 45-50%, no rwma, Gr 1 DD, minimally increased tranvsvalvular velocity, no AI/AS, mild MR, mildly dil LA, PASP .  Marland Kitchen CAD (coronary artery disease)     a. 09/2014 Cath: patent LAD/LCX stents, otw nonobs dzs.  . CVD (cerebrovascular disease)   . Hyperlipidemia   . Hypertension   . PVD (peripheral vascular disease) (HCC)   . Mitral regurgitation   . Aortic insufficiency   . Carotid artery occlusion   . Diabetes mellitus type 2, controlled (HCC)   . Shortness of breath dyspnea   . GERD (gastroesophageal reflux disease)   . History of hiatal hernia   . Arthritis     told that she has OA- pt. reports that she doesn't have any pain from it but she reports that she has back pain from time to time   . CHF (congestive heart failure) [facility])     Past Surgical History  Procedure Laterality Date  . Tympanomastoidectomy  2002    right  . Stents  2008  . Abdominal hysterectomy      vaginal approach, partial   . Cardiac catheterization  04/07/07    By Dr. Excell Seltzer -- 1. Severe LAD stenosis with successful PCI using a drug-eluting stent. 2. Severe left circumflex stenosis with successful PCI using a single  drug-eluting stent.  Marland Kitchen  Cardiac catheterization N/A 10/07/2014    Procedure: Right/Left Heart Cath and Coronary Angiography;  Surgeon: Tonny Bollman, MD;  Location: [facility] INVASIVE CV LAB;  Service: Cardiovascular;  Laterality: N/A;  . Eye surgery      bilateral cataracts removed & /w IOL  . Transcatheter aortic valve replacement, transfemoral N/A 11/26/2014    Procedure: TRANSCATHETER AORTIC VALVE REPLACEMENT, TRANSFEMORAL;  Surgeon: Tonny Bollman, MD;  Location: [facility] OR;  Service: Open Heart Surgery;  Laterality: N/A;  . Tee without cardioversion N/A 11/26/2014    Procedure: TRANSESOPHAGEAL ECHOCARDIOGRAM (TEE);  Surgeon: Tonny Bollman, MD;  Location: [facility] OR;  Service: Open Heart Surgery;   Laterality: N/A;    Social History   Social History  . Marital Status: Widowed    Spouse Name: N/A  . Number of Children: N/A  . Years of Education: N/A   Occupational History  . 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 Smoking status: Former Smoker -- 0.40 packs/day for 5 years    Types: Cigarettes    Quit date: 05/24/2006  . Smokeless tobacco: Never Used  . Alcohol Use: 0.0 oz/week    0 Standard drinks or equivalent per week     Comment: wine- Glass/ per day   . Drug Use: No  . Sexual Activity: Not on file   Other Topics Concern  . Not on file   Social History Narrative    Family History  Problem Relation Age of Onset  . Pneumonia    . Heart attack Mother   . Heart disease Mother     Before age 75  . Hyperlipidemia Mother   . Hypertension Mother   . Heart disease Sister     Before age 79  . Hyperlipidemia Sister   . Hypertension Sister   . Heart disease Brother     Before age 75  . Hyperlipidemia Brother   . Heart attack Brother   . Heart disease Son 99    After age 40  . Heart attack Sister   . Heart attack Son   . Stroke Maternal Uncle   . Heart disease Brother     Before age 80-  Stents - CHF    ROS: no fevers or chills, productive cough, hemoptysis, dysphasia, odynophagia, melena, hematochezia, dysuria, hematuria, rash, seizure activity, orthopnea, PND, pedal edema, claudication. Remaining systems are negative.  Physical Exam: Well-developed well-nourished in no acute distress.  Skin is warm and dry.  HEENT is normal.  Neck is supple.  Chest is clear to auscultation with normal expansion.  Cardiovascular exam is regular rate and rhythm.  Abdominal exam nontender or distended. No masses palpated. Extremities show no edema. neuro grossly intact  ECG Sinus bradycardia, left bundle branch block.

## 2015-07-07 ENCOUNTER — Ambulatory Visit (INDEPENDENT_AMBULATORY_CARE_PROVIDER_SITE_OTHER): Payer: Medicare Other | Admitting: Cardiology

## 2015-07-07 ENCOUNTER — Encounter: Payer: Self-pay | Admitting: Cardiology

## 2015-07-07 VITALS — BP 190/60 | HR 50 | Ht 60.0 in | Wt 109.2 lb

## 2015-07-07 DIAGNOSIS — I2510 Atherosclerotic heart disease of native coronary artery without angina pectoris: Secondary | ICD-10-CM | POA: Diagnosis not present

## 2015-07-07 DIAGNOSIS — I2583 Coronary atherosclerosis due to lipid rich plaque: Secondary | ICD-10-CM

## 2015-07-07 DIAGNOSIS — I359 Nonrheumatic aortic valve disorder, unspecified: Secondary | ICD-10-CM

## 2015-07-07 DIAGNOSIS — E785 Hyperlipidemia, unspecified: Secondary | ICD-10-CM | POA: Diagnosis not present

## 2015-07-07 DIAGNOSIS — I10 Essential (primary) hypertension: Secondary | ICD-10-CM

## 2015-07-07 DIAGNOSIS — R9389 Abnormal findings on diagnostic imaging of other specified body structures: Secondary | ICD-10-CM

## 2015-07-07 DIAGNOSIS — R938 Abnormal findings on diagnostic imaging of other specified body structures: Secondary | ICD-10-CM

## 2015-07-07 LAB — BASIC METABOLIC PANEL
BUN: 15 mg/dL (ref 7–25)
CALCIUM: 9.7 mg/dL (ref 8.6–10.4)
CO2: 29 mmol/L (ref 20–31)
Chloride: 102 mmol/L (ref 98–110)
Creat: 0.81 mg/dL (ref 0.60–0.88)
GLUCOSE: 81 mg/dL (ref 65–99)
Potassium: 4.7 mmol/L (ref 3.5–5.3)
SODIUM: 140 mmol/L (ref 135–146)

## 2015-07-07 LAB — HEPATIC FUNCTION PANEL
ALT: 16 U/L (ref 6–29)
AST: 20 U/L (ref 10–35)
Albumin: 4.1 g/dL (ref 3.6–5.1)
Alkaline Phosphatase: 103 U/L (ref 33–130)
BILIRUBIN DIRECT: 0.1 mg/dL (ref <=0.2)
BILIRUBIN INDIRECT: 0.7 mg/dL (ref 0.2–1.2)
BILIRUBIN TOTAL: 0.8 mg/dL (ref 0.2–1.2)
Total Protein: 7.5 g/dL (ref 6.1–8.1)

## 2015-07-07 LAB — LIPID PANEL
CHOLESTEROL: 181 mg/dL (ref 125–200)
HDL: 59 mg/dL (ref >=46)
LDL Cholesterol: 94 mg/dL (ref <130)
Total CHOL/HDL Ratio: 3.1 Ratio (ref <=5.0)
Triglycerides: 140 mg/dL (ref <150)
VLDL: 28 mg/dL (ref <30)

## 2015-07-07 NOTE — Assessment & Plan Note (Signed)
Plan followup chest CT 

## 2015-07-07 NOTE — Assessment & Plan Note (Addendum)
Continue statin. Check lipids and liver. 

## 2015-07-07 NOTE — Assessment & Plan Note (Signed)
Continue aspirin and statin. 

## 2015-07-07 NOTE — Assessment & Plan Note (Addendum)
Blood pressure mildly elevated. However she follows this closely at home and it is typically controlled. We will continue present medications and add additional as needed for blood pressure control. Check potassium and renal function.

## 2015-07-07 NOTE — Assessment & Plan Note (Addendum)
Status post AVR. Continue SBE prophylaxis. Continue aspirin. Discontinue Plavix.

## 2015-07-07 NOTE — Assessment & Plan Note (Signed)
Followed by vascular surgery.Continue aspirin and statin. 

## 2015-07-07 NOTE — Patient Instructions (Signed)
Medication Instructions:   STOP PLAVIX  Labwork:  Your physician recommends that you HAVE LAB WORK TODAY  Testing/Procedures:  CT SCAN OF THE CHEST WO FOR ABNORMAL CT  Follow-Up:  Your physician wants you to follow-up in: 6 MONTHS WITH DR Jens Som You will receive a reminder letter in the mail two months in advance. If you don't receive a letter, please call our office to schedule the follow-up appointment.   If you need a refill on your cardiac medications before your next appointment, please call your pharmacy.

## 2015-07-09 ENCOUNTER — Encounter: Payer: Self-pay | Admitting: *Deleted

## 2015-07-14 ENCOUNTER — Ambulatory Visit (INDEPENDENT_AMBULATORY_CARE_PROVIDER_SITE_OTHER)
Admission: RE | Admit: 2015-07-14 | Discharge: 2015-07-14 | Disposition: A | Discharge status: Home / Self Care | Payer: Medicare Other | Source: Ambulatory Visit | Attending: Cardiology | Admitting: Cardiology

## 2015-07-14 DIAGNOSIS — R938 Abnormal findings on diagnostic imaging of other specified body structures: Secondary | ICD-10-CM | POA: Diagnosis not present

## 2015-07-14 DIAGNOSIS — R9389 Abnormal findings on diagnostic imaging of other specified body structures: Secondary | ICD-10-CM

## 2015-09-30 ENCOUNTER — Other Ambulatory Visit: Payer: Self-pay

## 2015-09-30 MED ORDER — ATORVASTATIN CALCIUM 80 MG PO TABS: ORAL_TABLET | ORAL | Status: DC

## 2015-10-10 ENCOUNTER — Other Ambulatory Visit: Payer: Self-pay

## 2015-10-10 DIAGNOSIS — I350 Nonrheumatic aortic (valve) stenosis: Secondary | ICD-10-CM

## 2015-10-10 DIAGNOSIS — Z952 Presence of prosthetic heart valve: Secondary | ICD-10-CM

## 2015-12-12 ENCOUNTER — Ambulatory Visit (HOSPITAL_COMMUNITY): Payer: Medicare Other | Attending: Cardiovascular Disease

## 2015-12-12 ENCOUNTER — Other Ambulatory Visit: Payer: Self-pay

## 2015-12-12 ENCOUNTER — Encounter: Payer: Self-pay | Admitting: Cardiovascular Disease

## 2015-12-12 ENCOUNTER — Ambulatory Visit (INDEPENDENT_AMBULATORY_CARE_PROVIDER_SITE_OTHER): Payer: Medicare Other | Admitting: Cardiovascular Disease

## 2015-12-12 VITALS — BP 196/74 | HR 58 | Ht 61.0 in | Wt 108.0 lb

## 2015-12-12 DIAGNOSIS — Q211 Atrial septal defect: Secondary | ICD-10-CM | POA: Insufficient documentation

## 2015-12-12 DIAGNOSIS — I340 Nonrheumatic mitral (valve) insufficiency: Secondary | ICD-10-CM | POA: Diagnosis not present

## 2015-12-12 DIAGNOSIS — E119 Type 2 diabetes mellitus without complications: Secondary | ICD-10-CM | POA: Insufficient documentation

## 2015-12-12 DIAGNOSIS — Z954 Presence of other heart-valve replacement: Secondary | ICD-10-CM

## 2015-12-12 DIAGNOSIS — E785 Hyperlipidemia, unspecified: Secondary | ICD-10-CM | POA: Insufficient documentation

## 2015-12-12 DIAGNOSIS — I359 Nonrheumatic aortic valve disorder, unspecified: Secondary | ICD-10-CM | POA: Diagnosis not present

## 2015-12-12 DIAGNOSIS — I739 Peripheral vascular disease, unspecified: Secondary | ICD-10-CM | POA: Insufficient documentation

## 2015-12-12 DIAGNOSIS — Z952 Presence of prosthetic heart valve: Secondary | ICD-10-CM

## 2015-12-12 DIAGNOSIS — I350 Nonrheumatic aortic (valve) stenosis: Secondary | ICD-10-CM

## 2015-12-12 DIAGNOSIS — I119 Hypertensive heart disease without heart failure: Secondary | ICD-10-CM | POA: Insufficient documentation

## 2015-12-12 DIAGNOSIS — I2510 Atherosclerotic heart disease of native coronary artery without angina pectoris: Secondary | ICD-10-CM | POA: Diagnosis not present

## 2015-12-12 DIAGNOSIS — Z953 Presence of xenogenic heart valve: Secondary | ICD-10-CM | POA: Insufficient documentation

## 2015-12-12 DIAGNOSIS — Z87891 Personal history of nicotine dependence: Secondary | ICD-10-CM | POA: Diagnosis not present

## 2015-12-12 NOTE — Patient Instructions (Signed)
Medication Instructions:  Your physician recommends that you continue on your current medications as directed. Please refer to the Current Medication list given to you today.  Labwork: No new orders.   Testing/Procedures: No new orders.   Follow-Up: Your physician recommends that you continue routine cardiology follow-up with Dr Jens Somrenshaw.   Any Other Special Instructions Will Be Listed Below (If Applicable).     If you need a refill on your cardiac medications before your next appointment, please call your pharmacy.

## 2015-12-13 NOTE — Progress Notes (Signed)
Cardiology Office Note Date:  12/13/2015   ID:  JAKIRAH ZAUN, DOB [birth year], MRN [id number]  PCP:  Stefanie Libel, MD  Cardiologist:  Tonny Bollman, MD    No chief complaint on file.    History of Present Illness: Kara Santiago is a 82 y.o. female who presents for 1 year follow-up after undergoing transcatheter aortic valve replacement with a 23 mm Edwards Sapien 3 valve via left femoral approach 11/26/2014. The patient's postoperative course was uncomplicated. She is here today with her daughter. The patient is feeling well with no exertional dyspnea, chest pain, orthopnea, or PND. She's had no palpitations, lightheadedness, or syncope.   Past Medical History  Diagnosis Date  . Severe aortic stenosis     a. 09/2014 Echo: EF 40%, gr2 DD, sev AS (mean 34 mmHg, peak 57 mmHg), mod MR, mild LAE, nl RV, mild TR, triv PI, PASP 41 mmHg;  b. 11/2014 TAVR: Randa Evens Sapien 3 THV (size 23mm) via L femoral cutdown;  c. 11/2014 postop Echo: EF 45-50%, no rwma, Gr 1 DD, minimally increased tranvsvalvular velocity, no AI/AS, mild MR, mildly dil LA, PASP .  Marland Kitchen CAD (coronary artery disease)     a. 09/2014 Cath: patent LAD/LCX stents, otw nonobs dzs.  . CVD (cerebrovascular disease)   . Hyperlipidemia   . Hypertension   . PVD (peripheral vascular disease) (HCC)   . Mitral regurgitation   . Aortic insufficiency   . Carotid artery occlusion   . Diabetes mellitus type 2, controlled (HCC)   . Shortness of breath dyspnea   . GERD (gastroesophageal reflux disease)   . History of hiatal hernia   . Arthritis     told that she has OA- pt. reports that she doesn't have any pain from it but she reports that she has back pain from time to time   . CHF (congestive heart failure) [facility])     Past Surgical History  Procedure Laterality Date  . Tympanomastoidectomy  2002    right  . Stents  2008  . Abdominal hysterectomy      vaginal approach, partial   . Cardiac catheterization  04/07/07    By  Dr. Excell Seltzer -- 1. Severe LAD stenosis with successful PCI using a drug-eluting stent. 2. Severe left circumflex stenosis with successful PCI using a single  drug-eluting stent.  . Cardiac catheterization N/A 10/07/2014    Procedure: Right/Left Heart Cath and Coronary Angiography;  Surgeon: Tonny Bollman, MD;  Location: [facility] INVASIVE CV LAB;  Service: Cardiovascular;  Laterality: N/A;  . Eye surgery      bilateral cataracts removed & /w IOL  . Transcatheter aortic valve replacement, transfemoral N/A 11/26/2014    Procedure: TRANSCATHETER AORTIC VALVE REPLACEMENT, TRANSFEMORAL;  Surgeon: Tonny Bollman, MD;  Location: [facility] OR;  Service: Open Heart Surgery;  Laterality: N/A;  . Tee without cardioversion N/A 11/26/2014    Procedure: TRANSESOPHAGEAL ECHOCARDIOGRAM (TEE);  Surgeon: Tonny Bollman, MD;  Location: [facility] OR;  Service: Open Heart Surgery;  Laterality: N/A;    Current Outpatient Prescriptions  Medication Sig Dispense Refill  . aspirin 81 MG tablet Take 81 mg by mouth daily.      Marland Kitchen atorvastatin (LIPITOR) 80 MG tablet TAKE 1 TABLET (80 MG TOTAL) BY MOUTH DAILY. 90 tablet 0  . glipiZIDE (GLUCOTROL) 5 MG tablet Take 5 mg by mouth daily.      Marland Kitchen ibuprofen (ADVIL,MOTRIN) 200 MG tablet Take 200 mg by mouth every 6 (six) hours as needed for mild pain or  moderate pain.    Marland Kitchen lisinopril (PRINIVIL,ZESTRIL) 40 MG tablet Take 1 tablet (40 mg total) by mouth daily. 90 tablet 3  . metoprolol tartrate (LOPRESSOR) 25 MG tablet Take 1 tablet (25 mg total) by mouth 2 (two) times daily. 180 tablet 1   No current facility-administered medications for this visit.    Allergies:   Contrast media   Social History:  The patient  reports that she quit smoking about 9 years ago. Her smoking use included Cigarettes. She has a 2 pack-year smoking history. She has never used smokeless tobacco. She reports that she drinks alcohol. She reports that she does not use illicit drugs.   Family History:  The patient's  family history  includes Heart attack in her brother, mother, sister, and son; Heart disease in her brother, brother, mother, and sister; Heart disease (age of onset: 67) in her son; Hyperlipidemia in her brother, mother, and sister; Hypertension in her mother and sister; Stroke in her maternal uncle.    ROS:  Please see the history of present illness.  Otherwise, review of systems is positive for Back pain.  All other systems are reviewed and negative.    PHYSICAL EXAM: VS:  BP 196/74 mmHg  Pulse 58  Ht 5\' 1"  (1.549 m)  Wt 108 lb (48.988 kg)  BMI 20.42 kg/m2 , BMI Body mass index is 20.42 kg/(m^2). GEN: Well nourished, well developed, in no acute distress HEENT: normal Neck: no JVD, no masses.  Cardiac: RRR with 2/6 systolic murmur at the RUSB              Respiratory:  clear to auscultation bilaterally, normal work of breathing GI: soft, nontender, nondistended, + BS MS: no deformity or atrophy Ext: no pretibial edema Skin: warm and dry, no rash Neuro:  Strength and sensation are intact Psych: euthymic mood, full affect  EKG:  EKG is not ordered today.  Recent Labs: 07/07/2015: ALT 16; BUN 15; Creat 0.81; Potassium 4.7; Sodium 140   Lipid Panel     Component Value Date/Time   CHOL 181 07/07/2015 0845   TRIG 140 07/07/2015 0845   HDL 59 07/07/2015 0845   CHOLHDL 3.1 07/07/2015 0845   VLDL 28 07/07/2015 0845   LDLCALC 94 07/07/2015 0845   LDLDIRECT 151.7 12/22/2012 0856      Wt Readings from Last 3 Encounters:  12/12/15 108 lb (48.988 kg)  07/07/15 109 lb 3.2 oz (49.533 kg)  02/18/15 107 lb (48.535 kg)     Cardiac Studies Reviewed: 2D Echo: Left ventricle: The cavity size was normal. Wall thickness was normal. Systolic function was mildly reduced. The estimated ejection fraction was in the range of 45% to 50%. Regional wall motion abnormalities: Hypokinesis of the  basal-midinferolateral myocardium.  ------------------------------------------------------------------- Aortic valve: A stent-valve bioprosthesis was present and functioning normally. Doppler: There was no significant regurgitation. VTI ratio of LVOT to aortic valve: 0.54. Peak velocity ratio of LVOT to aortic valve: 0.45. Mean velocity ratio of LVOT to aortic valve: 0.47. Mean gradient (S): 7 mm Hg. Peak gradient (S): 12 mm Hg.  ------------------------------------------------------------------- Aorta: Aortic root: The aortic root was normal in size. Ascending aorta: The ascending aorta was normal in size.  ------------------------------------------------------------------- Mitral valve: Mildly thickened leaflets . Leaflet separation was normal. Doppler: Transvalvular velocity was within the normal range. There was no evidence for stenosis. There was mild to moderate regurgitation directed centrally. Peak gradient (D): 3 mm Hg.  ------------------------------------------------------------------- Left atrium: The atrium was moderately dilated.  -------------------------------------------------------------------  Atrial septum: No defect or patent foramen ovale was identified.  ------------------------------------------------------------------- Right ventricle: The cavity size was normal. Systolic function was normal.  ------------------------------------------------------------------- Pulmonic valve: Structurally normal valve. Cusp separation was normal. Doppler: Transvalvular velocity was within the normal range. There was trivial regurgitation.  ------------------------------------------------------------------- Tricuspid valve: Structurally normal valve. Leaflet separation was normal. Doppler: Transvalvular velocity was within the normal range. There was mild  regurgitation.  ------------------------------------------------------------------- Pulmonary artery: Systolic pressure was mildly increased.  ------------------------------------------------------------------- Right atrium: The atrium was normal in size. The Eustachian valve appeared prominant.  ------------------------------------------------------------------- Pericardium: There was no pericardial effusion.  ------------------------------------------------------------------- Systemic veins: Inferior vena cava: The vessel was normal in size. The respirophasic diameter changes were in the normal range (= 50%), consistent with normal central venous pressure.   ASSESSMENT AND PLAN: 82 year old woman doing very well now one year out from TAVR. She has New York Heart Association functional class I symptoms of a chronic diastolic heart failure. She stopped Lasix per protocol proximally 6 months after TAVR. She understands to continue lifelong SBE prophylaxis when indicated. She will continue to follow with Dr. Jens Som.  The patient's blood pressure is elevated in the office today. She has been here a long time and underwent an echocardiogram just before the visit. States that her BP has been well controlled at home and her daughter who is a Engineer, civil (consulting) will check her mother's BP again later this afternoon. She will continue current medicines.  Current medicines are reviewed with the patient today.  The patient does not have concerns regarding medicines.  Labs/ tests ordered today include:  No orders of the defined types were placed in this encounter.    Disposition:   FU as needed.   Enzo Bi, MD  12/13/2015 11:43 PM    [facility] [address] [place], [place], [place]  [address] Phone: [phone]; Fax: [phone]

## 2015-12-26 ENCOUNTER — Other Ambulatory Visit: Payer: Self-pay | Admitting: Cardiology

## 2015-12-26 NOTE — Telephone Encounter (Signed)
Rx(s) sent to pharmacy electronically.  

## 2016-01-02 ENCOUNTER — Other Ambulatory Visit: Payer: Self-pay

## 2016-01-02 DIAGNOSIS — I359 Nonrheumatic aortic valve disorder, unspecified: Secondary | ICD-10-CM

## 2016-01-02 DIAGNOSIS — I10 Essential (primary) hypertension: Secondary | ICD-10-CM

## 2016-01-02 MED ORDER — METOPROLOL TARTRATE 25 MG PO TABS: 25.0000 mg | ORAL_TABLET | Freq: Two times a day (BID) | ORAL | 1 refills | Status: DC

## 2016-02-18 ENCOUNTER — Encounter: Payer: Self-pay | Admitting: Family

## 2016-02-24 ENCOUNTER — Ambulatory Visit (HOSPITAL_COMMUNITY)
Admission: RE | Admit: 2016-02-24 | Discharge: 2016-02-24 | Disposition: A | Discharge status: Home / Self Care | Payer: Medicare Other | Source: Ambulatory Visit | Attending: Vascular Surgery | Admitting: Vascular Surgery

## 2016-02-24 ENCOUNTER — Ambulatory Visit (INDEPENDENT_AMBULATORY_CARE_PROVIDER_SITE_OTHER): Payer: Medicare Other | Admitting: Family

## 2016-02-24 ENCOUNTER — Encounter: Payer: Self-pay | Admitting: Family

## 2016-02-24 VITALS — BP 192/83 | HR 55 | Temp 99.1°F | Resp 16 | Ht 61.0 in | Wt 110.0 lb

## 2016-02-24 DIAGNOSIS — I6523 Occlusion and stenosis of bilateral carotid arteries: Secondary | ICD-10-CM

## 2016-02-24 DIAGNOSIS — Z87891 Personal history of nicotine dependence: Secondary | ICD-10-CM

## 2016-02-24 LAB — VAS US CAROTID
LCCADSYS: 60 cm/s
LEFT ECA DIAS: 10 cm/s
LEFT VERTEBRAL DIAS: 16 cm/s
LICADDIAS: -23 cm/s
Left CCA dist dias: 12 cm/s
Left ICA dist sys: -126 cm/s
RCCAPSYS: 84 cm/s
RIGHT CCA MID DIAS: 16 cm/s
RIGHT ECA DIAS: 18 cm/s
RIGHT VERTEBRAL DIAS: 14 cm/s
Right CCA prox dias: 12 cm/s
Right cca dist sys: -90 cm/s

## 2016-02-24 NOTE — Patient Instructions (Signed)
Stroke Prevention Some medical conditions and behaviors are associated with an increased chance of having a stroke. You may prevent a stroke by making healthy choices and managing medical conditions. HOW CAN I REDUCE MY RISK OF HAVING A STROKE?   Stay physically active. Get at least 30 minutes of activity on most or all days.  Do not smoke. It may also be helpful to avoid exposure to secondhand smoke.  Limit alcohol use. Moderate alcohol use is considered to be:  No more than 2 drinks per day for men.  No more than 1 drink per day for nonpregnant women.  Eat healthy foods. This involves:  Eating 5 or more servings of fruits and vegetables a day.  Making dietary changes that address high blood pressure (hypertension), high cholesterol, diabetes, or obesity.  Manage your cholesterol levels.  Making food choices that are high in fiber and low in saturated fat, trans fat, and cholesterol may control cholesterol levels.  Take any prescribed medicines to control cholesterol as directed by your health care provider.  Manage your diabetes.  Controlling your carbohydrate and sugar intake is recommended to manage diabetes.  Take any prescribed medicines to control diabetes as directed by your health care provider.  Control your hypertension.  Making food choices that are low in salt (sodium), saturated fat, trans fat, and cholesterol is recommended to manage hypertension.  Ask your health care provider if you need treatment to lower your blood pressure. Take any prescribed medicines to control hypertension as directed by your health care provider.  If you are 18-39 years of age, have your blood pressure checked every 3-5 years. If you are 40 years of age or older, have your blood pressure checked every year.  Maintain a healthy weight.  Reducing calorie intake and making food choices that are low in sodium, saturated fat, trans fat, and cholesterol are recommended to manage  weight.  Stop drug abuse.  Avoid taking birth control pills.  Talk to your health care provider about the risks of taking birth control pills if you are over 35 years old, smoke, get migraines, or have ever had a blood clot.  Get evaluated for sleep disorders (sleep apnea).  Talk to your health care provider about getting a sleep evaluation if you snore a lot or have excessive sleepiness.  Take medicines only as directed by your health care provider.  For some people, aspirin or blood thinners (anticoagulants) are helpful in reducing the risk of forming abnormal blood clots that can lead to stroke. If you have the irregular heart rhythm of atrial fibrillation, you should be on a blood thinner unless there is a good reason you cannot take them.  Understand all your medicine instructions.  Make sure that other conditions (such as anemia or atherosclerosis) are addressed. SEEK IMMEDIATE MEDICAL CARE IF:   You have sudden weakness or numbness of the face, arm, or leg, especially on one side of the body.  Your face or eyelid droops to one side.  You have sudden confusion.  You have trouble speaking (aphasia) or understanding.  You have sudden trouble seeing in one or both eyes.  You have sudden trouble walking.  You have dizziness.  You have a loss of balance or coordination.  You have a sudden, severe headache with no known cause.  You have new chest pain or an irregular heartbeat. Any of these symptoms may represent a serious problem that is an emergency. Do not wait to see if the symptoms will   go away. Get medical help at once. Call your local emergency services (911 in [place] Do not drive yourself to the hospital.   This information is not intended to replace advice given to you by your health care provider. Make sure you discuss any questions you have with your health care provider.   Document Released: 06/17/2004 Document Revised: 05/31/2014 Document Reviewed:  11/10/2012 Elsevier Interactive Patient Education 2016 Elsevier Inc.  

## 2016-02-24 NOTE — Progress Notes (Signed)
Chief Complaint: Follow up Extracranial Carotid Artery Stenosis   History of Present Illness  Kara Santiago is a 82 y.o. female patient of Dr. Arbie Cookey who has a history of mild extracranial carotid artery stenosis, and returns for scheduled Duplex surveillance. She has not required carotid artery intervention to date.   She has no history of TIA or stroke symptoms. Specifically she denies a history of amaurosis fugax or monocular blindness,unilateral facial drooping,  hemiplegia, or receptive or expressive aphasia.   She sees Dr. Jens Som for a history of a known cardiac murmur; she had her aortic valve replaced July 2016.  She states her blood pressure at home runs about 140's/60-70's, but states is increases in a medical office.   Pt Diabetic: Yes , states in good control Pt smoker: former smoker, quit 2008   Pt meds include:  Statin : Yes  Betablocker: Yes  ASA: Yes  Other anticoagulants/antiplatelets: Plavix started after aortic valve replacement July 2016   Past Medical History:  Diagnosis Date  . Aortic insufficiency   . Arthritis    told that she has OA- pt. reports that she doesn't have any pain from it but she reports that she has back pain from time to time   . CAD (coronary artery disease)    a. 09/2014 Cath: patent LAD/LCX stents, otw nonobs dzs.  . Carotid artery occlusion   . CHF (congestive heart failure) (HCC)   . CVD (cerebrovascular disease)   . Diabetes mellitus type 2, controlled (HCC)   . GERD (gastroesophageal reflux disease)   . History of hiatal hernia   . Hyperlipidemia   . Hypertension   . Mitral regurgitation   . PVD (peripheral vascular disease) (HCC)   . Severe aortic stenosis    a. 09/2014 Echo: EF 40%, gr2 DD, sev AS (mean 34 mmHg, peak 57 mmHg), mod MR, mild LAE, nl RV, mild TR, triv PI, PASP 41 mmHg;  b. 11/2014 TAVR: Randa Evens Sapien 3 THV (size 23mm) via L femoral cutdown;  c. 11/2014 postop Echo: EF 45-50%, no rwma, Gr 1 DD,  minimally increased tranvsvalvular velocity, no AI/AS, mild MR, mildly dil LA, PASP .  Marland Kitchen Shortness of breath dyspnea     Social History Social History  Substance Use Topics  . Smoking status: Former Smoker    Packs/day: 0.40    Years: 5.00    Types: Cigarettes    Quit date: 05/24/2006  . Smokeless tobacco: Never Used  . Alcohol use 0.0 oz/week     Comment: wine- Glass/ per day     Family History Family History  Problem Relation Age of Onset  . Pneumonia    . Heart attack Mother   . Heart disease Mother     Before age 80  . Hyperlipidemia Mother   . Hypertension Mother   . Heart disease Sister     Before age 59  . Hyperlipidemia Sister   . Hypertension Sister   . Heart disease Brother     Before age 6  . Hyperlipidemia Brother   . Heart attack Brother   . Heart disease Son 34    After age 71  . Heart attack Sister   . Heart attack Son   . Stroke Maternal Uncle   . Heart disease Brother     Before age 73-  Stents - CHF    Surgical History Past Surgical History:  Procedure Laterality Date  . ABDOMINAL HYSTERECTOMY     vaginal approach, partial   .  CARDIAC CATHETERIZATION  04/07/07   By Dr. Excell Seltzer -- 1. Severe LAD stenosis with successful PCI using a drug-eluting stent. 2. Severe left circumflex stenosis with successful PCI using a single  drug-eluting stent.  Marland Kitchen CARDIAC CATHETERIZATION N/A 10/07/2014   Procedure: Right/Left Heart Cath and Coronary Angiography;  Surgeon: Tonny Bollman, MD;  Location: [facility] INVASIVE CV LAB;  Service: Cardiovascular;  Laterality: N/A;  . EYE SURGERY     bilateral cataracts removed & /w IOL  . stents  2008  . TEE WITHOUT CARDIOVERSION N/A 11/26/2014   Procedure: TRANSESOPHAGEAL ECHOCARDIOGRAM (TEE);  Surgeon: Tonny Bollman, MD;  Location: [facility] OR;  Service: Open Heart Surgery;  Laterality: N/A;  . TRANSCATHETER AORTIC VALVE REPLACEMENT, TRANSFEMORAL N/A 11/26/2014   Procedure: TRANSCATHETER AORTIC VALVE REPLACEMENT, TRANSFEMORAL;   Surgeon: Tonny Bollman, MD;  Location: [facility] OR;  Service: Open Heart Surgery;  Laterality: N/A;  . TYMPANOMASTOIDECTOMY  2002   right    Allergies  Allergen Reactions  . Contrast Media [Iodinated Diagnostic Agents] Rash    Blister's    Current Outpatient Prescriptions  Medication Sig Dispense Refill  . aspirin 81 MG tablet Take 81 mg by mouth daily.      Marland Kitchen atorvastatin (LIPITOR) 80 MG tablet Take 1 tablet (80 mg total) by mouth daily. 90 tablet 2  . glipiZIDE (GLUCOTROL) 5 MG tablet Take 5 mg by mouth daily.      Marland Kitchen ibuprofen (ADVIL,MOTRIN) 200 MG tablet Take 200 mg by mouth every 6 (six) hours as needed for mild pain or moderate pain.    Marland Kitchen lisinopril (PRINIVIL,ZESTRIL) 40 MG tablet Take 1 tablet (40 mg total) by mouth daily. 90 tablet 3  . metoprolol tartrate (LOPRESSOR) 25 MG tablet Take 1 tablet (25 mg total) by mouth 2 (two) times daily. 180 tablet 1   No current facility-administered medications for this visit.     Review of Systems : See HPI for pertinent positives and negatives.  Physical Examination  Vitals:   02/24/16 1344 02/24/16 1346  BP: (!) 166/82 (!) 192/83  Pulse: (!) 55   Resp: 16   Temp: 99.1 F (37.3 C)   TempSrc: Oral   SpO2: 98%   Weight: 110 lb (49.9 kg)   Height: 5\' 1"  (1.549 m)    Body mass index is 20.78 kg/m.  General: WDWN female in NAD  GAIT: normal  Eyes: PERRLA  Pulmonary: Respirations are non labored, CTAB, no rales, no rhonchi, & no wheezing.  Cardiac: regular rhythm, bradycardia (on a beta blocker), + murmur.   VASCULAR EXAM  Carotid Bruits  Left  Right    Transmitted cardiac murmur Transmitted cardiac murmur   LE Pulses  LEFT  RIGHT   POPLITEAL  not palpable  not palpable   POSTERIOR TIBIAL  Not palpable  not palpable   DORSALIS PEDIS  ANTERIOR TIBIAL  palpable  palpable    Gastrointestinal: soft, nontender, BS WNL, no r/g, no masses palpated.  Musculoskeletal: No muscle atrophy/wasting. M/S 5/5  throughout Extremities without ischemic changes.  Neurologic: A&O X 3; Appropriate Affect ; SENSATION ;normal; Speech is normal  CN 2-12 intact except has mild hearing loss, Pain and light touch intact in extremities, Motor exam as listed above.     Assessment: CASSADIE PANKONIN is a 82 y.o. female who has no history of stroke or TIA. Her atherosclerotic risk factors include controlled DM, former smoker, and CAD. Her blood pressure is elevated today, but states it is elevated in a medical office. She she  will check her blood pressure when she gets home, and will notify her PCP's office if it remains above 140/90.  She denies headache, denies feeling light headed, denies dyspnea, denies chest pain.   DATA Carotid duplex today suggests <40% rightl ICA stenosis and 40-59% left ICA stenosis.  Increased velocity in the left ICA compared to the exam on 02/18/15.    Plan: Follow-up in 1 year with Carotid Duplex scan.   I discussed in depth with the patient the nature of atherosclerosis, and emphasized the importance of maximal medical management including strict control of blood pressure, blood glucose, and lipid levels, obtaining regular exercise, and continued cessation of smoking.  The patient is aware that without maximal medical management the underlying atherosclerotic disease process will progress, limiting the benefit of any interventions. The patient was given information about stroke prevention and what symptoms should prompt the patient to seek immediate medical care. Thank you for allowing us to participate in this patient's care.  Charisse MarchSuzanne Amel Gianino, RN, MSN, FNP-C [facility] Office: [phone]  Clinic Physician: Edilia BoDickson on call  02/24/16 1:56 PM

## 2016-02-27 NOTE — Addendum Note (Signed)
Addended by: Rayley Gao N on: 02/27/2016 05:00 PM   Modules accepted: Orders  

## 2016-04-06 ENCOUNTER — Other Ambulatory Visit: Payer: Self-pay | Admitting: Cardiovascular Disease

## 2016-09-23 ENCOUNTER — Other Ambulatory Visit: Payer: Self-pay | Admitting: Cardiovascular Disease

## 2016-09-23 DIAGNOSIS — I359 Nonrheumatic aortic valve disorder, unspecified: Secondary | ICD-10-CM

## 2016-09-23 DIAGNOSIS — I10 Essential (primary) hypertension: Secondary | ICD-10-CM

## 2016-11-28 IMAGING — CR DG CHEST 2V
2 series · 2 of 2 positions shown · non-contrast
Comparison: None.

CLINICAL DATA: Preoperative exam prior to valvuloplasty for aortic
stenosis

EXAM:
CHEST  2 VIEW

[w chest pa]
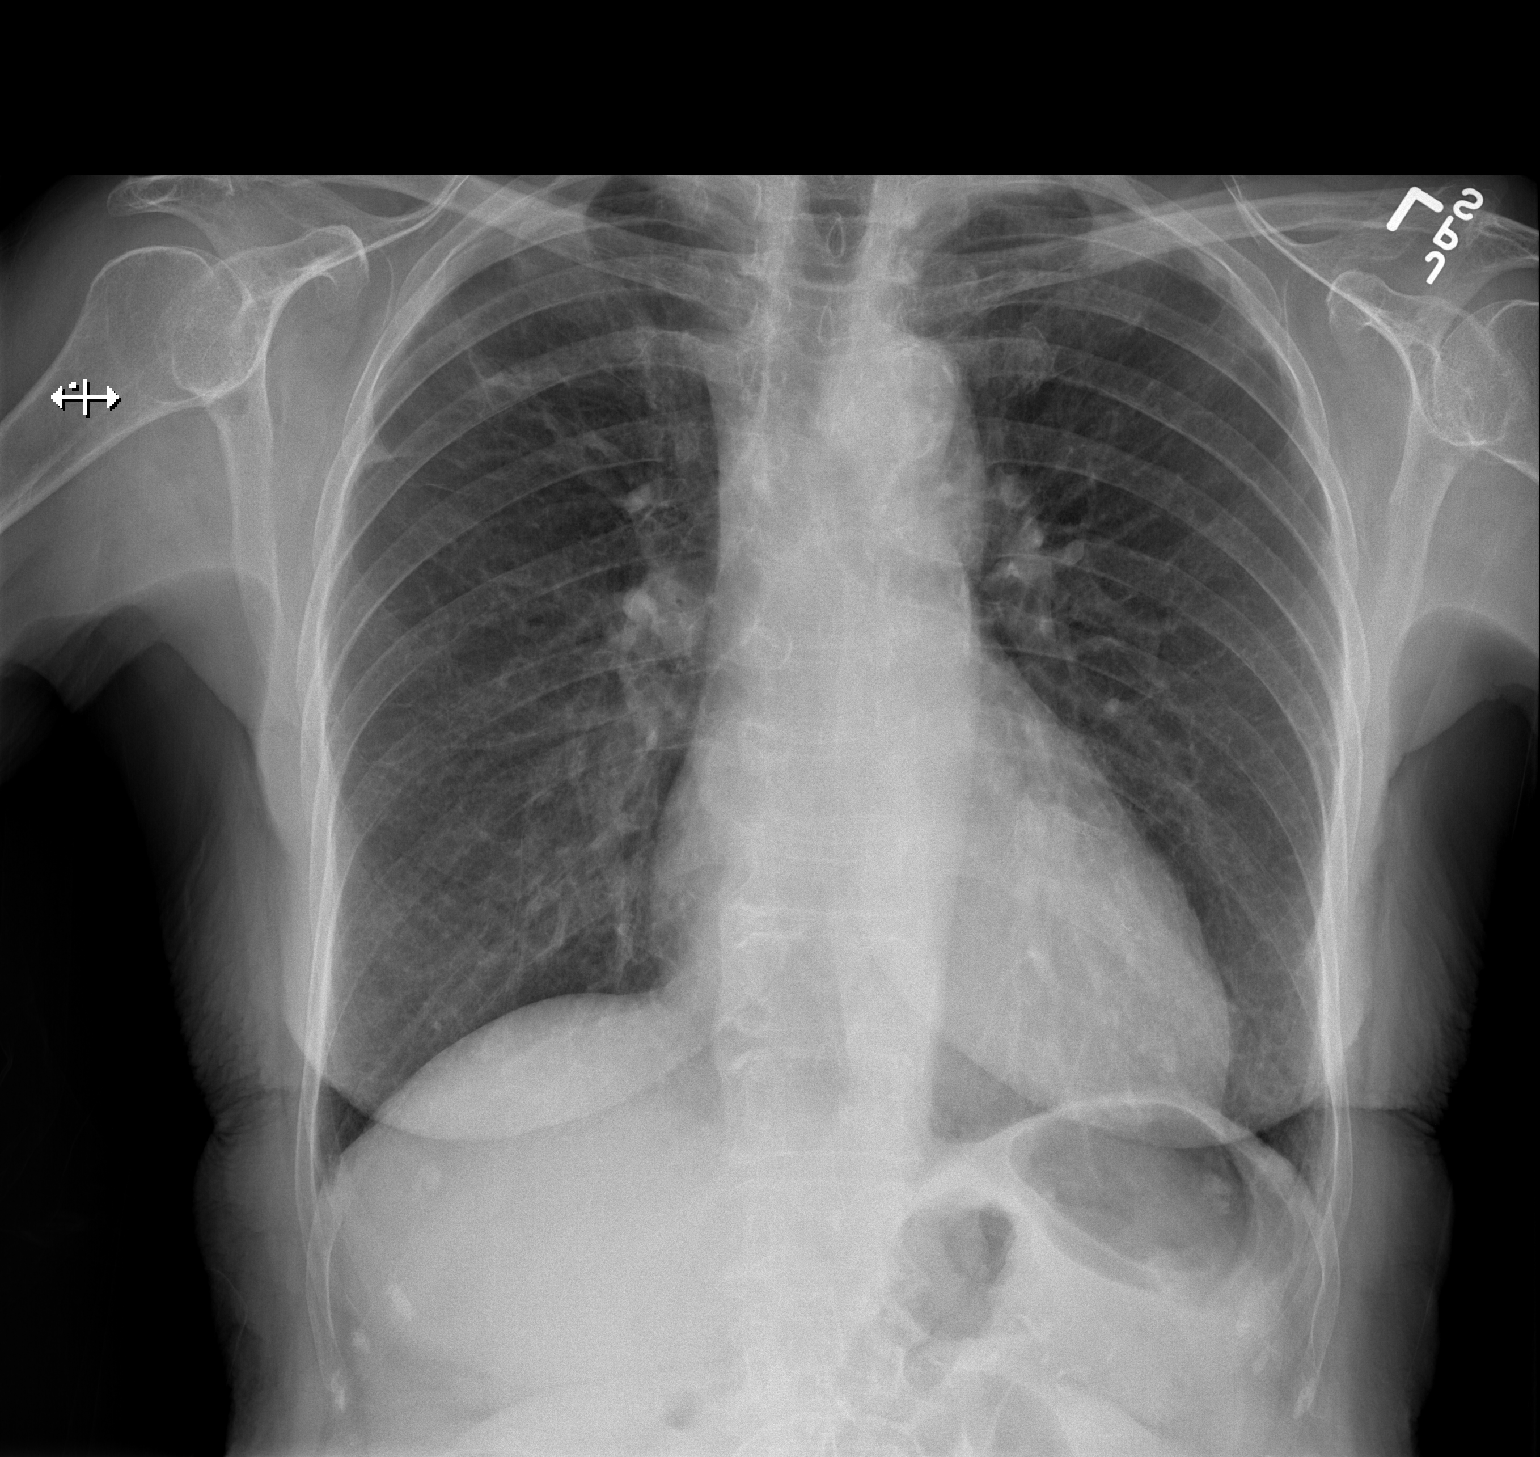

[w chest lat]
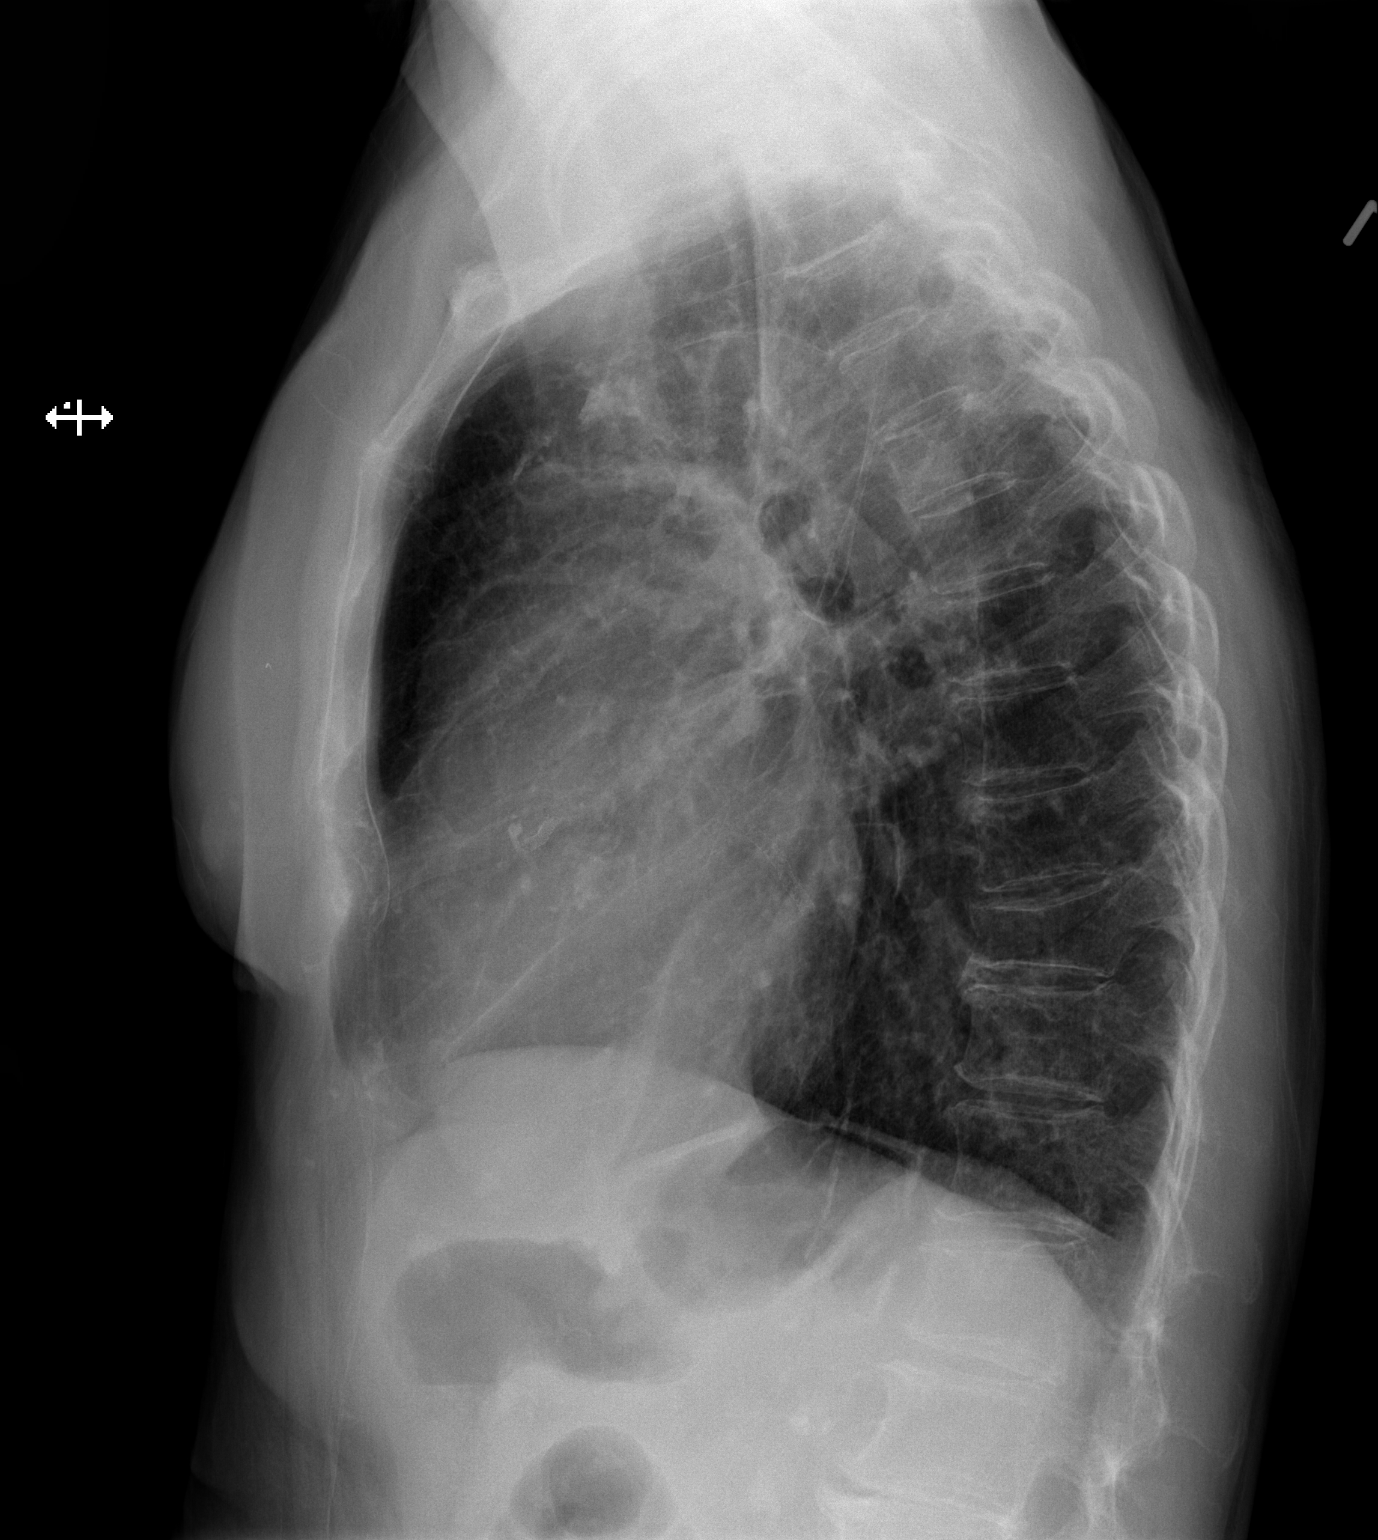

[2 of 2 positions shown; findings below may reference images not displayed]

FINDINGS: The heart size is at upper limits of normal. Both lungs are clear.
The visualized skeletal structures are unremarkable.
IMPRESSION: No active cardiopulmonary disease.

## 2016-12-15 ENCOUNTER — Other Ambulatory Visit: Payer: Self-pay | Admitting: Cardiology

## 2016-12-15 NOTE — Telephone Encounter (Signed)
This is Dr. Ludwig Clarksrenshaw's pt. Dr. Jens Somrenshaw is the primary cardiologist. Please address. Thanks

## 2017-01-15 ENCOUNTER — Other Ambulatory Visit: Payer: Self-pay | Admitting: Cardiovascular Disease

## 2017-02-21 ENCOUNTER — Other Ambulatory Visit: Payer: Self-pay | Admitting: Cardiology

## 2017-03-01 ENCOUNTER — Encounter: Payer: Self-pay | Admitting: Family

## 2017-03-01 ENCOUNTER — Ambulatory Visit (HOSPITAL_COMMUNITY)
Admission: RE | Admit: 2017-03-01 | Discharge: 2017-03-01 | Disposition: A | Discharge status: Home / Self Care | Payer: Medicare Other | Source: Ambulatory Visit | Attending: Vascular Surgery | Admitting: Vascular Surgery

## 2017-03-01 ENCOUNTER — Ambulatory Visit (INDEPENDENT_AMBULATORY_CARE_PROVIDER_SITE_OTHER): Payer: Medicare Other | Admitting: Family

## 2017-03-01 VITALS — BP 166/74 | HR 54 | Temp 98.1°F | Resp 16 | Ht 61.0 in | Wt 110.0 lb

## 2017-03-01 DIAGNOSIS — I6523 Occlusion and stenosis of bilateral carotid arteries: Secondary | ICD-10-CM

## 2017-03-01 DIAGNOSIS — Z87891 Personal history of nicotine dependence: Secondary | ICD-10-CM | POA: Diagnosis not present

## 2017-03-01 NOTE — Progress Notes (Signed)
Chief Complaint: Follow up Extracranial Carotid Artery Stenosis   History of Present Illness  Kara Santiago is a 83 y.o. female patient of Dr. Arbie Cookey who has a history of mild extracranial carotid artery stenosis, and returns for scheduled Duplex surveillance. She has not required carotid artery intervention to date.   She has no history of TIA or stroke symptoms. Specifically she denies a history of amaurosis fugax or monocular blindness,unilateral facial drooping, hemiplegia, or receptive or expressive aphasia.   She sees Dr. Jens Som for a history of a known cardiac murmur; she had her aortic valve replaced July 2016.  She states her blood pressure at home runs about 140's/60-70's, but states is increases in a medical office.   Pt Diabetic: Yes, states in good control Pt smoker: former smoker, quit 2008  Pt meds include:  Statin : Yes  Betablocker: Yes  ASA: Yes  Other anticoagulants/antiplatelets: no   Past Medical History:  Diagnosis Date  . Aortic insufficiency   . Arthritis    told that she has OA- pt. reports that she doesn't have any pain from it but she reports that she has back pain from time to time   . CAD (coronary artery disease)    a. 09/2014 Cath: patent LAD/LCX stents, otw nonobs dzs.  . Carotid artery occlusion   . CHF (congestive heart failure) (HCC)   . CVD (cerebrovascular disease)   . Diabetes mellitus type 2, controlled (HCC)   . GERD (gastroesophageal reflux disease)   . History of hiatal hernia   . Hyperlipidemia   . Hypertension   . Mitral regurgitation   . PVD (peripheral vascular disease) (HCC)   . Severe aortic stenosis    a. 09/2014 Echo: EF 40%, gr2 DD, sev AS (mean 34 mmHg, peak 57 mmHg), mod MR, mild LAE, nl RV, mild TR, triv PI, PASP 41 mmHg;  b. 11/2014 TAVR: Randa Evens Sapien 3 THV (size 23mm) via L femoral cutdown;  c. 11/2014 postop Echo: EF 45-50%, no rwma, Gr 1 DD, minimally increased tranvsvalvular velocity, no AI/AS,  mild MR, mildly dil LA, PASP .  Marland Kitchen Shortness of breath dyspnea     Social History Social History  Substance Use Topics  . Smoking status: Former Smoker    Packs/day: 0.40    Years: 5.00    Types: Cigarettes    Quit date: 05/24/2006  . Smokeless tobacco: Never Used  . Alcohol use 0.0 oz/week     Comment: wine- Glass/ per day     Family History Family History  Problem Relation Age of Onset  . Pneumonia Unknown   . Heart attack Mother   . Heart disease Mother        Before age 61  . Hyperlipidemia Mother   . Hypertension Mother   . Heart disease Sister        Before age 25  . Hyperlipidemia Sister   . Hypertension Sister   . Heart disease Brother        Before age 22  . Hyperlipidemia Brother   . Heart attack Brother   . Heart disease Son 36       After age 27  . Heart attack Sister   . Heart attack Son   . Stroke Maternal Uncle   . Heart disease Brother        Before age 38-  Stents - CHF    Surgical History Past Surgical History:  Procedure Laterality Date  . ABDOMINAL HYSTERECTOMY  vaginal approach, partial   . CARDIAC CATHETERIZATION  04/07/07   By Dr. Excell Seltzer -- 1. Severe LAD stenosis with successful PCI using a drug-eluting stent. 2. Severe left circumflex stenosis with successful PCI using a single  drug-eluting stent.  Marland Kitchen CARDIAC CATHETERIZATION N/A 10/07/2014   Procedure: Right/Left Heart Cath and Coronary Angiography;  Surgeon: Tonny Bollman, MD;  Location: [facility] INVASIVE CV LAB;  Service: Cardiovascular;  Laterality: N/A;  . EYE SURGERY     bilateral cataracts removed & /w IOL  . stents  2008  . TEE WITHOUT CARDIOVERSION N/A 11/26/2014   Procedure: TRANSESOPHAGEAL ECHOCARDIOGRAM (TEE);  Surgeon: Tonny Bollman, MD;  Location: [facility] OR;  Service: Open Heart Surgery;  Laterality: N/A;  . TRANSCATHETER AORTIC VALVE REPLACEMENT, TRANSFEMORAL N/A 11/26/2014   Procedure: TRANSCATHETER AORTIC VALVE REPLACEMENT, TRANSFEMORAL;  Surgeon: Tonny Bollman, MD;   Location: [facility] OR;  Service: Open Heart Surgery;  Laterality: N/A;  . TYMPANOMASTOIDECTOMY  2002   right    Allergies  Allergen Reactions  . Contrast Media [Iodinated Diagnostic Agents] Rash    Blister's    Current Outpatient Prescriptions  Medication Sig Dispense Refill  . aspirin 81 MG tablet Take 81 mg by mouth daily.      Marland Kitchen atorvastatin (LIPITOR) 80 MG tablet TAKE 1 TABLET (80 MG TOTAL) BY MOUTH DAILY. 90 tablet 0  . glipiZIDE (GLUCOTROL) 5 MG tablet Take 5 mg by mouth daily.      Marland Kitchen ibuprofen (ADVIL,MOTRIN) 200 MG tablet Take 200 mg by mouth every 6 (six) hours as needed for mild pain or moderate pain.    Marland Kitchen lisinopril (PRINIVIL,ZESTRIL) 40 MG tablet TAKE 1 TABLET BY MOUTH EVERY DAY 30 tablet 0  . metoprolol tartrate (LOPRESSOR) 25 MG tablet TAKE 1 TABLET (25 MG TOTAL) BY MOUTH 2 (TWO) TIMES DAILY. 180 tablet 3   No current facility-administered medications for this visit.     Review of Systems : See HPI for pertinent positives and negatives.  Physical Examination  Vitals:   03/01/17 1433 03/01/17 1436  BP: (!) 170/69 (!) 166/74  Pulse: (!) 54   Resp: 16   Temp: 98.1 F (36.7 C)   TempSrc: Oral   SpO2: 97%   Weight: 110 lb (49.9 kg)   Height:  (1.549 m)    Body mass index is 20.78 kg/m.  General: WDWN female in NAD  GAIT:normal  Eyes: PERRLA  Pulmonary: Respirations are non labored, CTAB, no rales, no rhonchi, &no wheezing.  Cardiac: regular rhythm, bradycardia (on a beta blocker), + murmur.   VASCULAR EXAM Carotid Bruits Left Right   Transmitted cardiac murmur Transmitted cardiac murmur   LE Pulses  LEFT  RIGHT   POPLITEAL  not palpable  not palpable  POSTERIOR TIBIAL  Not palpable  not palpable   DORSALIS PEDIS ANTERIOR TIBIAL  palpable  palpable    Gastrointestinal: soft, nontender, BS WNL, no r/g, no masses palpated.  Musculoskeletal: No muscle atrophy/wasting. M/S 5/5 throughout Extremities without ischemic  changes.  Neurologic: A&O X 3; Appropriate Affect ; SENSATION ;normal; Speech is normal  CN 2-12 intact except has mild hearing loss, Pain and light touch intact in extremities, Motor exam as listed above   Assessment: Kara Santiago is a 83 y.o. female who has no history of stroke or TIA. Her atherosclerotic risk factors include controlled DM, former smoker, and CAD.  DATA Carotid Duplex (03/01/17): <40% rightl ICA stenosis. 40-59% left ICA stenosis.  Right ECA stenosis.  Bilateral vertebral artery flow is  antegrade. Bilateral subclavian artery waveforms are biphasic. No significant change compared to the exam on 02-23-16.    Plan: Follow-up in 1 year with Carotid Duplex scan.     I discussed in depth with the patient the nature of atherosclerosis, and emphasized the importance of maximal medical management including strict control of blood pressure, blood glucose, and lipid levels, obtaining regular exercise, and continued cessation of smoking.  The patient is aware that without maximal medical management the underlying atherosclerotic disease process will progress, limiting the benefit of any interventions. The patient was given information about stroke prevention and what symptoms should prompt the patient to seek immediate medical care. Thank you for allowing [place] to participate in this patient's care.  Charisse March, RN, MSN, FNP-C [facility] Office: [phone]  Clinic Physician: Early  03/01/17 2:53 PM

## 2017-03-01 NOTE — Patient Instructions (Signed)
Stroke Prevention Some medical conditions and behaviors are associated with an increased chance of having a stroke. You may prevent a stroke by making healthy choices and managing medical conditions. How can I reduce my risk of having a stroke?  Stay physically active. Get at least 30 minutes of activity on most or all days.  Do not smoke. It may also be helpful to avoid exposure to secondhand smoke.  Limit alcohol use. Moderate alcohol use is considered to be:  No more than 2 drinks per day for men.  No more than 1 drink per day for nonpregnant women.  Eat healthy foods. This involves:  Eating 5 or more servings of fruits and vegetables a day.  Making dietary changes that address high blood pressure (hypertension), high cholesterol, diabetes, or obesity.  Manage your cholesterol levels.  Making food choices that are high in fiber and low in saturated fat, trans fat, and cholesterol may control cholesterol levels.  Take any prescribed medicines to control cholesterol as directed by your health care provider.  Manage your diabetes.  Controlling your carbohydrate and sugar intake is recommended to manage diabetes.  Take any prescribed medicines to control diabetes as directed by your health care provider.  Control your hypertension.  Making food choices that are low in salt (sodium), saturated fat, trans fat, and cholesterol is recommended to manage hypertension.  Ask your health care provider if you need treatment to lower your blood pressure. Take any prescribed medicines to control hypertension as directed by your health care provider.  If you are 18-39 years of age, have your blood pressure checked every 3-5 years. If you are 40 years of age or older, have your blood pressure checked every year.  Maintain a healthy weight.  Reducing calorie intake and making food choices that are low in sodium, saturated fat, trans fat, and cholesterol are recommended to manage  weight.  Stop drug abuse.  Avoid taking birth control pills.  Talk to your health care provider about the risks of taking birth control pills if you are over 35 years old, smoke, get migraines, or have ever had a blood clot.  Get evaluated for sleep disorders (sleep apnea).  Talk to your health care provider about getting a sleep evaluation if you snore a lot or have excessive sleepiness.  Take medicines only as directed by your health care provider.  For some people, aspirin or blood thinners (anticoagulants) are helpful in reducing the risk of forming abnormal blood clots that can lead to stroke. If you have the irregular heart rhythm of atrial fibrillation, you should be on a blood thinner unless there is a good reason you cannot take them.  Understand all your medicine instructions.  Make sure that other conditions (such as anemia or atherosclerosis) are addressed. Get help right away if:  You have sudden weakness or numbness of the face, arm, or leg, especially on one side of the body.  Your face or eyelid droops to one side.  You have sudden confusion.  You have trouble speaking (aphasia) or understanding.  You have sudden trouble seeing in one or both eyes.  You have sudden trouble walking.  You have dizziness.  You have a loss of balance or coordination.  You have a sudden, severe headache with no known cause.  You have new chest pain or an irregular heartbeat. Any of these symptoms may represent a serious problem that is an emergency. Do not wait to see if the symptoms will go away.   Get medical help at once. Call your local emergency services (911 in [place] Do not drive yourself to the hospital. This information is not intended to replace advice given to you by your health care provider. Make sure you discuss any questions you have with your health care provider. Document Released: 06/17/2004 Document Revised: 10/16/2015 Document Reviewed: 11/10/2012 Elsevier  Interactive Patient Education  2017 Elsevier Inc.  

## 2017-03-24 ENCOUNTER — Other Ambulatory Visit: Payer: Self-pay | Admitting: Cardiology

## 2017-03-24 NOTE — Telephone Encounter (Signed)
REFILL 

## 2017-03-28 ENCOUNTER — Other Ambulatory Visit: Payer: Self-pay | Admitting: Cardiology

## 2017-04-04 ENCOUNTER — Telehealth: Payer: Self-pay | Admitting: Cardiology

## 2017-04-04 MED ORDER — AMLODIPINE BESYLATE 5 MG PO TABS: 5.0000 mg | ORAL_TABLET | Freq: Every day | ORAL | 0 refills | Status: DC

## 2017-04-04 NOTE — Telephone Encounter (Signed)
New message   Pt c/o BP issue: STAT if pt c/o blurred vision, one-sided weakness or slurred speech  1. What are your last 5 BP readings?       today - 170/80 - medication has been taken .    2. Are you having any other symptoms (ex. Dizziness, headache, blurred vision, passed out)? No   3. What is your BP issue? Does blood pressure needs to be adjusted

## 2017-04-04 NOTE — Telephone Encounter (Signed)
Returned the call to the patient's daughter, per the patient. She stated that the patient's blood pressure has been running high with the systolic's in the 170's and 190's and heart rates in the 60's. She denies chest pain and edema. She stated that her mother has been doubling up on her medication.  Per Dr. Jens Somrenshaw, the patient needs to stop doubling up on her medication. She should go back to the Lisinopril 40 mg and Metoprolol 25 mg bid. She should also start Amlodipine 5 mg tablet daily and keep a log of her blood pressures. She has an appointment on 12/13 with Dr. Jens Somrenshaw. The patient and patient's daughter verbalized their understanding.

## 2017-04-20 NOTE — Addendum Note (Signed)
Addended by: Sriyan Cutting A on: 04/20/2017 03:51 PM   Modules accepted: Orders  

## 2017-04-26 ENCOUNTER — Other Ambulatory Visit: Payer: Self-pay | Admitting: Cardiology

## 2017-04-28 NOTE — Progress Notes (Signed)
HPI: Follow-up coronary disease and aortic valve replacement. Patient had cardiac catheterization May 2016  That showed patent stents in the LAD and circumflex and no other obstructive disease. Ejection fraction 50%.  Moderate mitral regurgitation and severe aortic stenosis. She underwent TAVR 7/16. She also has cerebrovascular disease followed by vascular surgery. Echo 7/17 showed EF 45-50, s/p AVR with normal gradients, mild to moderate MR, moderate LAE. Since last seen, she denies dyspnea, chest pain palpitations or syncope.  Current Outpatient Medications  Medication Sig Dispense Refill  . aspirin 81 MG tablet Take 81 mg by mouth daily.      Marland Kitchen. atorvastatin (LIPITOR) 80 MG tablet TAKE 1 TABLET BY MOUTH EVERY DAY 90 tablet 0  . glipiZIDE (GLUCOTROL) 5 MG tablet Take 5 mg by mouth daily.      Marland Kitchen. ibuprofen (ADVIL,MOTRIN) 200 MG tablet Take 200 mg by mouth every 6 (six) hours as needed for mild pain or moderate pain.    Marland Kitchen. lisinopril (PRINIVIL,ZESTRIL) 40 MG tablet TAKE 1 TABLET BY MOUTH EVERY DAY 30 tablet 0  . metoprolol tartrate (LOPRESSOR) 25 MG tablet TAKE 1 TABLET (25 MG TOTAL) BY MOUTH 2 (TWO) TIMES DAILY. 180 tablet 3  . amLODipine (NORVASC) 5 MG tablet TAKE 1 TABLET (5 MG TOTAL) DAILY BY MOUTH. (Patient not taking: Reported on 05/05/2017) 30 tablet 0   No current facility-administered medications for this visit.      Past Medical History:  Diagnosis Date  . Aortic insufficiency   . Arthritis    told that she has OA- pt. reports that she doesn't have any pain from it but she reports that she has back pain from time to time   . CAD (coronary artery disease)    a. 09/2014 Cath: patent LAD/LCX stents, otw nonobs dzs.  . Carotid artery occlusion   . CHF (congestive heart failure) (HCC)   . CVD (cerebrovascular disease)   . Diabetes mellitus type 2, controlled (HCC)   . GERD (gastroesophageal reflux disease)   . History of hiatal hernia   . Hyperlipidemia   . Hypertension   .  Mitral regurgitation   . PVD (peripheral vascular disease) (HCC)   . Severe aortic stenosis    a. 09/2014 Echo: EF 40%, gr2 DD, sev AS (mean 34 mmHg, peak 57 mmHg), mod MR, mild LAE, nl RV, mild TR, triv PI, PASP 41 mmHg;  b. 11/2014 TAVR: Randa EvensEdwards Sapien 3 THV (size 23mm) via L femoral cutdown;  c. 11/2014 postop Echo: EF 45-50%, no rwma, Gr 1 DD, minimally increased tranvsvalvular velocity, no AI/AS, mild MR, mildly dil LA, PASP 41mmHg.  Marland Kitchen. Shortness of breath dyspnea     Past Surgical History:  Procedure Laterality Date  . ABDOMINAL HYSTERECTOMY     vaginal approach, partial   . CARDIAC CATHETERIZATION  04/07/07   By Dr. Excell Seltzerooper -- 1. Severe LAD stenosis with successful PCI using a drug-eluting stent. 2. Severe left circumflex stenosis with successful PCI using a single  drug-eluting stent.  Marland Kitchen. CARDIAC CATHETERIZATION N/A 10/07/2014   Procedure: Right/Left Heart Cath and Coronary Angiography;  Surgeon: Tonny BollmanMichael Cooper, MD;  [facility];  Service: Cardiovascular;  Laterality: N/A;  . EYE SURGERY     bilateral cataracts removed & /w IOL  . stents  2008  . TEE WITHOUT CARDIOVERSION N/A 11/26/2014   Procedure: TRANSESOPHAGEAL ECHOCARDIOGRAM (TEE);  Surgeon: Tonny BollmanMichael Cooper, MD;  [facility];  Service: Open Heart Surgery;  Laterality: N/A;  .  TRANSCATHETER AORTIC VALVE REPLACEMENT, TRANSFEMORAL N/A 11/26/2014   Procedure: TRANSCATHETER AORTIC VALVE REPLACEMENT, TRANSFEMORAL;  Surgeon: Tonny BollmanMichael Cooper, MD;  [facility];  Service: Open Heart Surgery;  Laterality: N/A;  . TYMPANOMASTOIDECTOMY  2002   right    Social History   Socioeconomic History  . Marital status: Widowed    Spouse name: Not on file  . Number of children: Not on file  . Years of education: Not on file  . Highest education level: Not on file  Social Needs  . Financial resource strain: Not on file  . Food insecurity - worry: Not on file  . Food insecurity - inability: Not on file  . Transportation needs -  medical: Not on file  . Transportation needs - non-medical: Not on file  Occupational History  . 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 Smoking status: Former Smoker    Packs/day: 0.40    Years: 5.00    Pack years: 2.00    Types: Cigarettes    Last attempt to quit: 05/24/2006    Years since quitting: 10.9  . Smokeless tobacco: Never Used  Substance and Sexual Activity  . Alcohol use: Yes    Alcohol/week: 0.0 oz    Comment: wine- Glass/ per day   . Drug use: No  . Sexual activity: Not on file  Other Topics Concern  . Not on file  Social History Narrative  . Not on file    Family History  Problem Relation Age of Onset  . Heart attack Mother   . Heart disease Mother        Before age 81  . Hyperlipidemia Mother   . Hypertension Mother   . Heart disease Sister        Before age 81  . Hyperlipidemia Sister   . Hypertension Sister   . Heart disease Brother        Before age 81  . Hyperlipidemia Brother   . Heart attack Brother   . Heart disease Brother        Before age 81-  Stents - CHF  . Pneumonia Unknown   . Heart disease Son [id number]       After age 81  . Heart attack Sister   . Heart attack Son   . Stroke Maternal Uncle     ROS: no fevers or chills, productive cough, hemoptysis, dysphasia, odynophagia, melena, hematochezia, dysuria, hematuria, rash, seizure activity, orthopnea, PND, pedal edema, claudication. Remaining systems are negative.  Physical Exam: Well-developed well-nourished in no acute distress.  Skin is warm and dry.  HEENT is normal.  Neck is supple.  Chest is clear to auscultation with normal expansion.  Cardiovascular exam is regular rate and rhythm. 2/6 systolic murmur left sternal border. No diastolic murmur. Abdominal exam nontender or distended. No masses palpated. Extremities show no edema. neuro grossly intact  ECG- sinus bradycardia at a rate of 48. Left bundle branch block.personally reviewed   A/P  1  coronary artery disease-continue aspirin and statin. No chest pain.  2 status post aortic valve replacement-continue SBE prophylaxis.  3 hypertension-blood pressure is elevated. Heart rate is decreased. Decrease metoprolol to 12.5 mg twice a day. She has not been taking her amlodipine. I have asked her to begin 5 mg daily and follow blood pressure. Check potassium and renal function.  4 hyperlipidemia-continue statin. Check lipids and liver.  5 carotid artery disease-followed by vascular surgery. Continue aspirin and statin.  Olga MillersBrian Sabrinia Prien, MD

## 2017-05-01 ENCOUNTER — Other Ambulatory Visit: Payer: Self-pay | Admitting: Cardiology

## 2017-05-05 ENCOUNTER — Ambulatory Visit (INDEPENDENT_AMBULATORY_CARE_PROVIDER_SITE_OTHER): Payer: Medicare Other | Admitting: Cardiology

## 2017-05-05 ENCOUNTER — Encounter: Payer: Self-pay | Admitting: Cardiology

## 2017-05-05 VITALS — BP 158/86 | HR 48 | Ht 59.0 in | Wt 108.0 lb

## 2017-05-05 DIAGNOSIS — I2510 Atherosclerotic heart disease of native coronary artery without angina pectoris: Secondary | ICD-10-CM | POA: Diagnosis not present

## 2017-05-05 DIAGNOSIS — E7800 Pure hypercholesterolemia, unspecified: Secondary | ICD-10-CM | POA: Diagnosis not present

## 2017-05-05 DIAGNOSIS — I10 Essential (primary) hypertension: Secondary | ICD-10-CM

## 2017-05-05 DIAGNOSIS — I359 Nonrheumatic aortic valve disorder, unspecified: Secondary | ICD-10-CM | POA: Diagnosis not present

## 2017-05-05 DIAGNOSIS — I6523 Occlusion and stenosis of bilateral carotid arteries: Secondary | ICD-10-CM | POA: Diagnosis not present

## 2017-05-05 DIAGNOSIS — Z952 Presence of prosthetic heart valve: Secondary | ICD-10-CM

## 2017-05-05 LAB — COMPREHENSIVE METABOLIC PANEL
ALBUMIN: 4.2 g/dL (ref 3.5–4.7)
ALK PHOS: 103 IU/L (ref 39–117)
ALT: 15 IU/L (ref 0–32)
AST: 23 IU/L (ref 0–40)
Albumin/Globulin Ratio: 1.4 (ref 1.2–2.2)
BILIRUBIN TOTAL: 0.5 mg/dL (ref 0.0–1.2)
BUN / CREAT RATIO: 20 (ref 12–28)
BUN: 16 mg/dL (ref 8–27)
CHLORIDE: 102 mmol/L (ref 96–106)
CO2: 27 mmol/L (ref 20–29)
Calcium: 9.7 mg/dL (ref 8.7–10.3)
Creatinine, Ser: 0.82 mg/dL (ref 0.57–1.00)
GFR calc Af Amer: 73 mL/min/{1.73_m2} (ref >59)
GFR calc non Af Amer: 64 mL/min/{1.73_m2} (ref >59)
GLUCOSE: 86 mg/dL (ref 65–99)
Globulin, Total: 3 g/dL (ref 1.5–4.5)
POTASSIUM: 4.5 mmol/L (ref 3.5–5.2)
Sodium: 142 mmol/L (ref 134–144)
Total Protein: 7.2 g/dL (ref 6.0–8.5)

## 2017-05-05 LAB — LIPID PANEL
CHOL/HDL RATIO: 3.2 ratio (ref 0.0–4.4)
Cholesterol, Total: 161 mg/dL (ref 100–199)
HDL: 51 mg/dL (ref >39)
LDL Calculated: 89 mg/dL (ref 0–99)
Triglycerides: 103 mg/dL (ref 0–149)
VLDL CHOLESTEROL CAL: 21 mg/dL (ref 5–40)

## 2017-05-05 MED ORDER — METOPROLOL TARTRATE 25 MG PO TABS: 12.5000 mg | ORAL_TABLET | Freq: Two times a day (BID) | ORAL | 3 refills | Status: DC

## 2017-05-05 NOTE — Patient Instructions (Signed)
Medication Instructions:   DECREASE METOPROLOL TO 12.5 MG TWICE DAILY=1/2 OF THE 25 MG TABLET ONCE DAILY  START AMLODIPINE 5 MG ONCE DAILY  Labwork:  Your physician recommends that you HAVE LAB WORK TODAY  Follow-Up:  Your physician wants you to follow-up in: ONE YEAR WITH DR Shelda PalRENSHAW You will receive a reminder letter in the mail two months in advance. If you don't receive a letter, please call our office to schedule the follow-up appointment.   If you need a refill on your cardiac medications before your next appointment, please call your pharmacy.

## 2017-06-06 ENCOUNTER — Other Ambulatory Visit: Payer: Self-pay | Admitting: Cardiology

## 2017-06-06 NOTE — Telephone Encounter (Signed)
Rx(s) sent to pharmacy electronically.  

## 2017-07-11 ENCOUNTER — Other Ambulatory Visit: Payer: Self-pay | Admitting: Cardiology

## 2017-07-21 IMAGING — CT CT CHEST W/O CM
2 of 3 series · 15 of 36 positions shown, 18 images · non-contrast
Comparison: Multiple exams, including 10/23/2014

CLINICAL DATA: Biapical nodularity on prior chest CT, for follow
up.

EXAM:
CT CHEST WITHOUT CONTRAST
TECHNIQUE: Multidetector CT imaging of the chest was performed following the
standard protocol without IV contrast.

[Series 2: thorax · axial · 0.61mm/px · z∈[-264,-34]mm · 12 of 55 slices shown, 15 images]
[im 5/55  mediastinal]
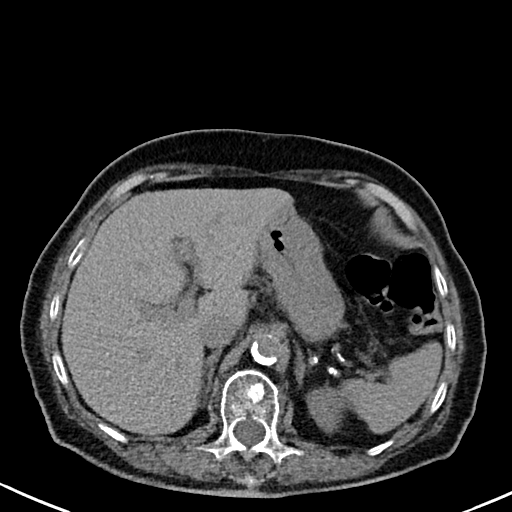
[im 5/55  lung]
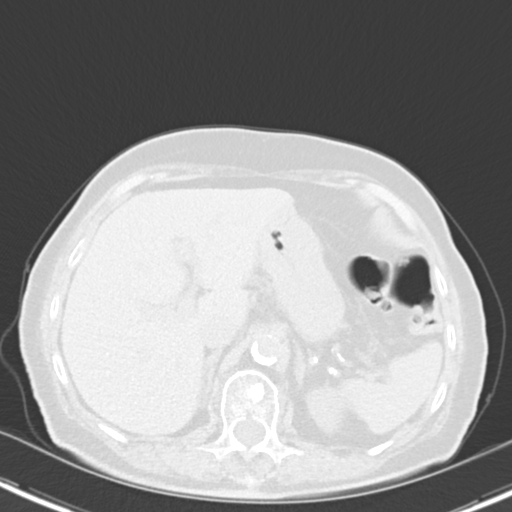
[im 9/55  lung]
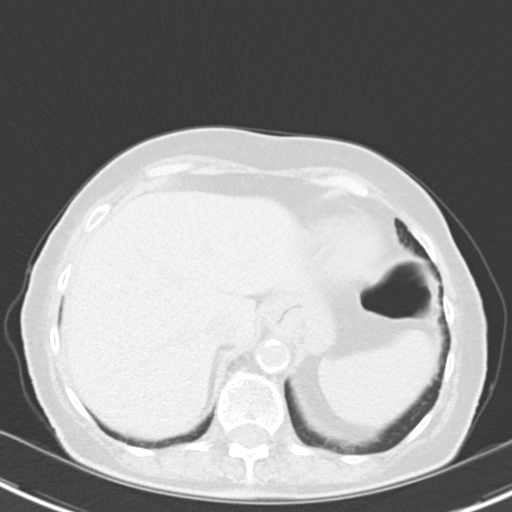
[im 13/55  lung]
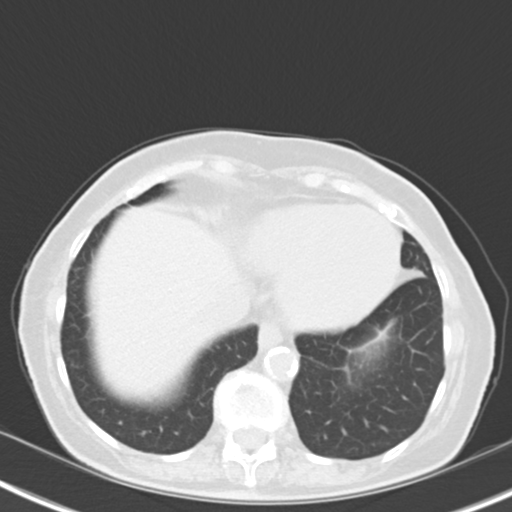
[im 17/55  lung]
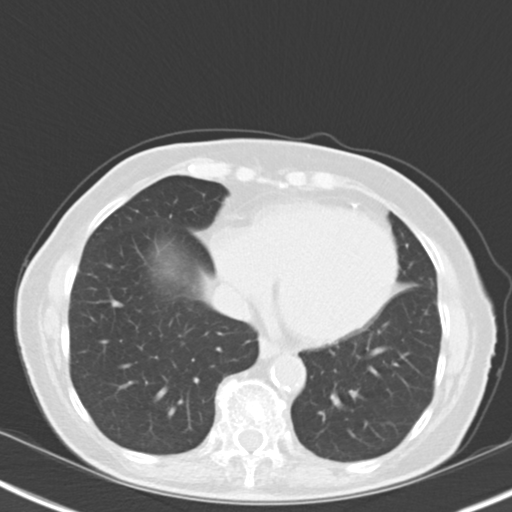
[im 21/55  mediastinal]
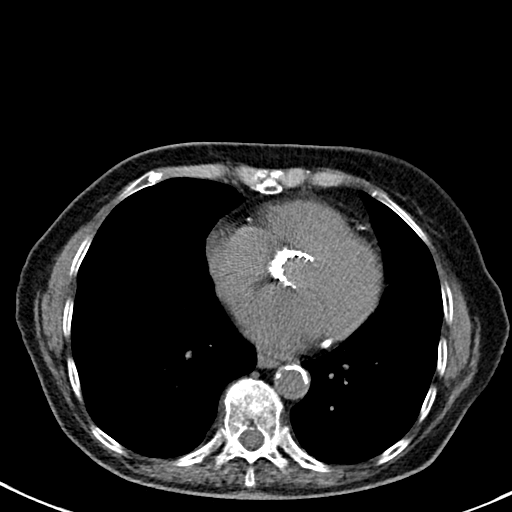
[im 21/55  lung]
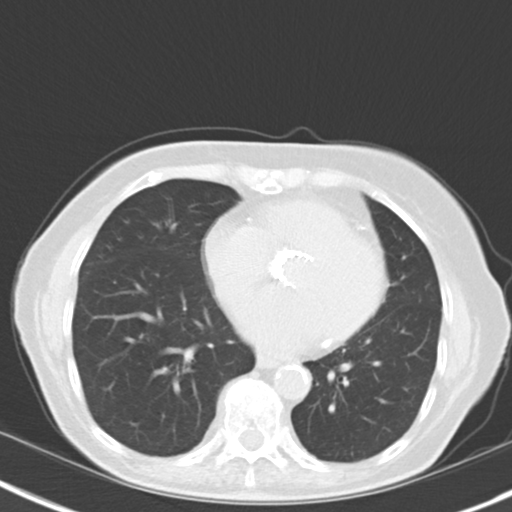
[im 25/55  lung]
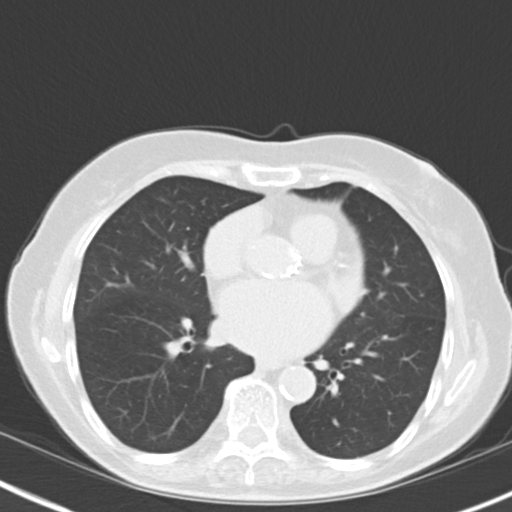
[im 31/55  lung]
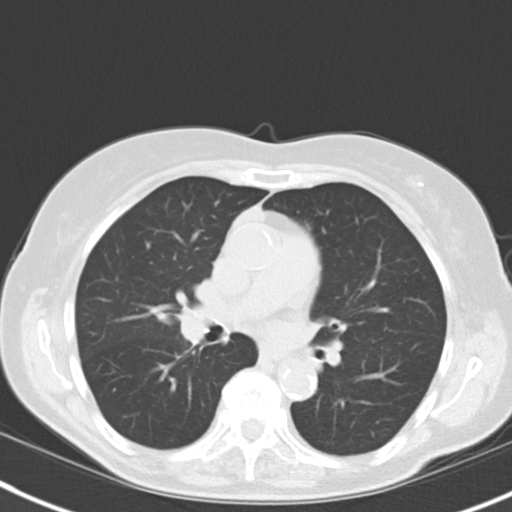
[im 35/55  lung]
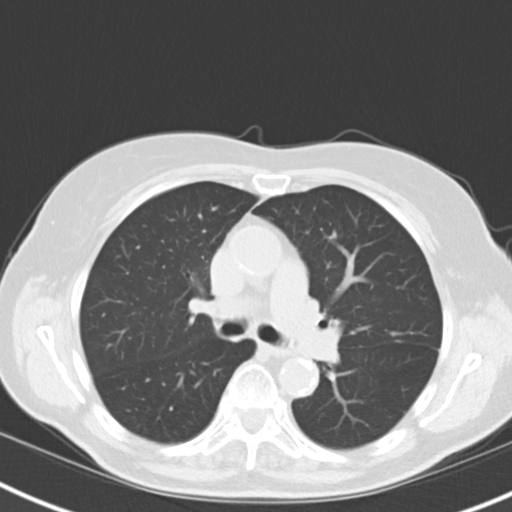
[im 39/55  mediastinal]
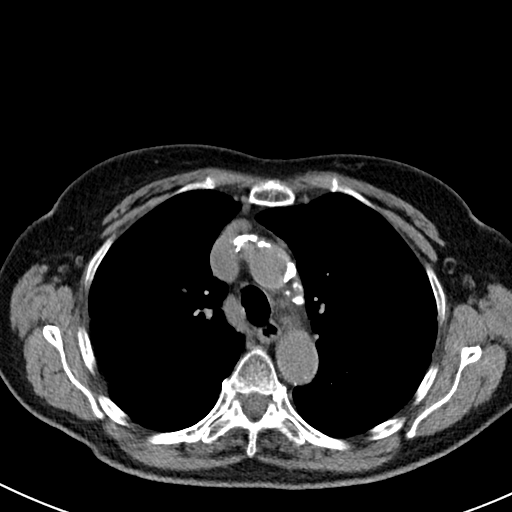
[im 39/55  lung]
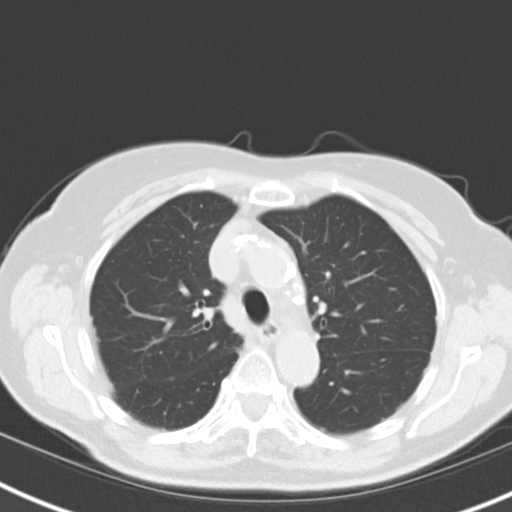
[im 43/55  lung]
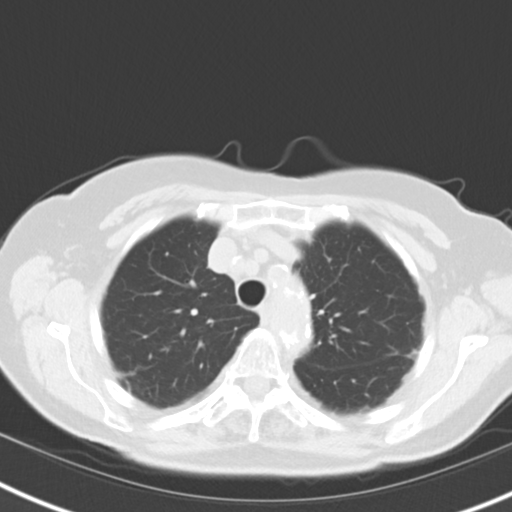
[im 47/55  lung]
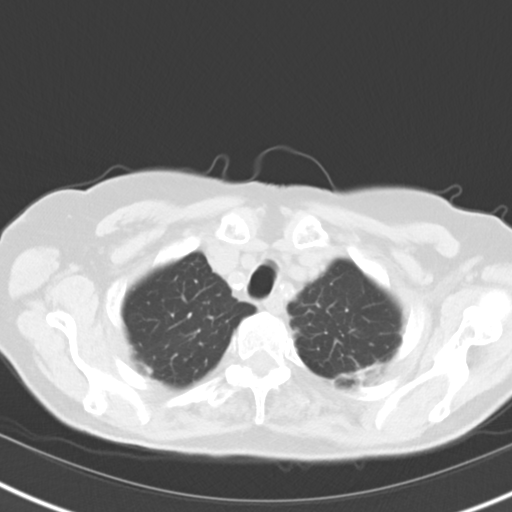
[im 51/55  lung]
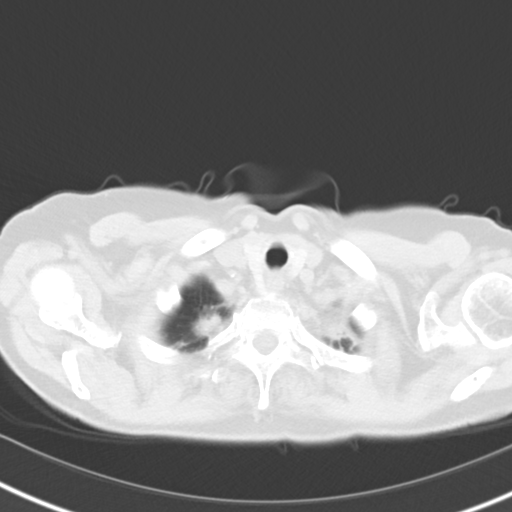

[Series 5: coronal · coronal · 0.52mm/px · 3 of 103 slices shown]
[im 21/103  lung]
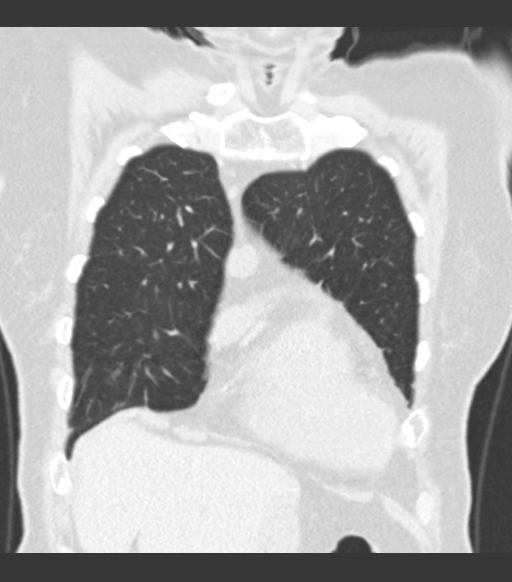
[im 41/103  lung]
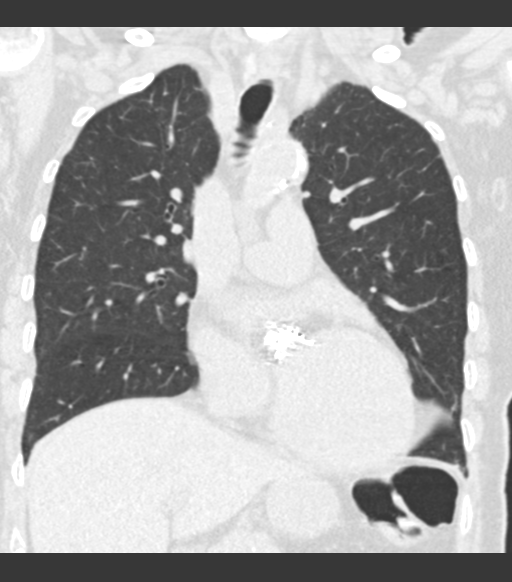
[im 62/103  lung]
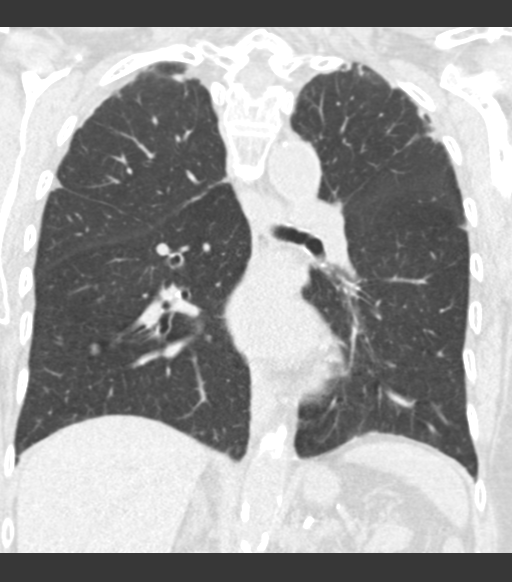

[15 of 36 positions shown; findings below may reference images not displayed]

FINDINGS: Mediastinum/Nodes: Coronary, aortic arch, and branch vessel
atherosclerotic vascular disease. Aortic valve prosthesis. No
pathologic thoracic adenopathy.

Lungs/Pleura: Biapical pleural-parenchymal scarring, fairly
symmetric and with some calcification particularly along the left
apical scarring. No change from 6 month ago. Comparing to
radiographs from 3665, appearance is grossly similar. Mild
peripheral scarring in the superior segment of both lower lobes.

Upper abdomen: Atherosclerotic abdominal aorta and splenic artery.

Musculoskeletal: Stable compression fractures at T10 and L1, and
along the superior endplate of T11. New (compared to 10/23/14 and
11/21/2014) 35% compression fracture of T7 mainly involving the
inferior surface, without significant bony retropulsion or
impingement.
IMPRESSION: 1. Biapical pleural-parenchymal scarring, no change from prior CT
from 10/23/2014. In my judgment, there is grossly similar appearance
on chest radiographs from 3665. Accordingly, given the symmetry, and
other characteristics favoring a benign etiology, ascribed the
appearance to benign pleural-parenchymal scarring. No further workup
is required.
2. New/interval 35% compression fracture of T7, likely osteoporotic.
Stable chronic compression fractures of T10, T11, and L1.
3. Coronary, aortic arch, and branch vessel atherosclerotic vascular
disease. Aortic valve prosthesis.

## 2017-07-29 ENCOUNTER — Other Ambulatory Visit: Payer: Self-pay

## 2017-07-29 MED ORDER — ATORVASTATIN CALCIUM 80 MG PO TABS: 80.0000 mg | ORAL_TABLET | Freq: Every day | ORAL | 3 refills | Status: DC

## 2017-07-29 NOTE — Telephone Encounter (Signed)
Patient called requesting a refill on Atorvastatin. Dr. Jens Somrenshaw patient. Please refill today, patient is out of medication.

## 2017-12-23 ENCOUNTER — Other Ambulatory Visit: Payer: Self-pay | Admitting: Cardiovascular Disease

## 2017-12-23 DIAGNOSIS — I359 Nonrheumatic aortic valve disorder, unspecified: Secondary | ICD-10-CM

## 2017-12-23 DIAGNOSIS — I10 Essential (primary) hypertension: Secondary | ICD-10-CM

## 2018-03-07 ENCOUNTER — Ambulatory Visit (INDEPENDENT_AMBULATORY_CARE_PROVIDER_SITE_OTHER): Payer: Medicare Other | Admitting: Family

## 2018-03-07 ENCOUNTER — Ambulatory Visit (HOSPITAL_COMMUNITY)
Admission: RE | Admit: 2018-03-07 | Discharge: 2018-03-07 | Disposition: A | Discharge status: Home / Self Care | Payer: Medicare Other | Source: Ambulatory Visit | Attending: Family | Admitting: Family

## 2018-03-07 ENCOUNTER — Encounter: Payer: Self-pay | Admitting: Family

## 2018-03-07 VITALS — BP 166/72 | HR 63 | Temp 97.9°F | Resp 16 | Ht 59.0 in | Wt 106.3 lb

## 2018-03-07 DIAGNOSIS — I6523 Occlusion and stenosis of bilateral carotid arteries: Secondary | ICD-10-CM | POA: Diagnosis not present

## 2018-03-07 DIAGNOSIS — Z87891 Personal history of nicotine dependence: Secondary | ICD-10-CM

## 2018-03-07 DIAGNOSIS — R030 Elevated blood-pressure reading, without diagnosis of hypertension: Secondary | ICD-10-CM | POA: Diagnosis not present

## 2018-03-07 NOTE — Patient Instructions (Signed)

## 2018-03-07 NOTE — Progress Notes (Signed)
Chief Complaint: Follow up Extracranial Carotid Artery Stenosis   History of Present Illness  Kara Santiago is a 84 y.o. female whom Dr. Arbie Cookey has been monitoring for mild extracranial carotid artery stenosis, and returns for scheduled Duplex surveillance. She has not required carotid artery intervention to date.   She has no history of TIA or stroke symptoms. Specifically shedenies a history of amaurosis fugax or monocular blindness,unilateral facial drooping, hemiplegia, or receptive or expressive aphasia.   She sees Dr. Jens Som for a history of a known cardiac murmur; she had her aortic valve replaced July 2016.  She states her blood pressure at home runs about 150-160//60-70's, but states is increases in a medical office.  She denies dyspnea or chest pain, denies headache.   She still mows her grass, does her lawn work, cooks, drives.   Diabetic: Yes, states in good control Tobacco use: former smoker, quit 2008  Pt meds include:  Statin : Yes  Betablocker: Yes  ASA: Yes  Other anticoagulants/antiplatelets: no   Past Medical History:  Diagnosis Date  . Aortic insufficiency   . Arthritis    told that she has OA- pt. reports that she doesn't have any pain from it but she reports that she has back pain from time to time   . CAD (coronary artery disease)    a. 09/2014 Cath: patent LAD/LCX stents, otw nonobs dzs.  . Carotid artery occlusion   . CHF (congestive heart failure) (HCC)   . CVD (cerebrovascular disease)   . Diabetes mellitus type 2, controlled (HCC)   . GERD (gastroesophageal reflux disease)   . History of hiatal hernia   . Hyperlipidemia   . Hypertension   . Mitral regurgitation   . PVD (peripheral vascular disease) (HCC)   . Severe aortic stenosis    a. 09/2014 Echo: EF 40%, gr2 DD, sev AS (mean 34 mmHg, peak 57 mmHg), mod MR, mild LAE, nl RV, mild TR, triv PI, PASP 41 mmHg;  b. 11/2014 TAVR: Randa Evens Sapien 3 THV (size 23mm) via L femoral  cutdown;  c. 11/2014 postop Echo: EF 45-50%, no rwma, Gr 1 DD, minimally increased tranvsvalvular velocity, no AI/AS, mild MR, mildly dil LA, PASP .  Marland Kitchen Shortness of breath dyspnea     Social History Social History   Tobacco Use  . Smoking status: Former Smoker    Packs/day: 0.40    Years: 5.00    Pack years: 2.00    Types: Cigarettes    Last attempt to quit: 05/24/2006    Years since quitting: 11.7  . Smokeless tobacco: Never Used  Substance Use Topics  . Alcohol use: Yes    Alcohol/week: 0.0 standard drinks    Comment: wine- Glass/ per day   . Drug use: No    Family History Family History  Problem Relation Age of Onset  . Heart attack Mother   . Heart disease Mother        Before age 36  . Hyperlipidemia Mother   . Hypertension Mother   . Heart disease Sister        Before age 58  . Hyperlipidemia Sister   . Hypertension Sister   . Heart disease Brother        Before age 67  . Hyperlipidemia Brother   . Heart attack Brother   . Heart disease Brother        Before age 3-  Stents - CHF  . Pneumonia Unknown   . Heart disease Son  27       After age 23  . Heart attack Sister   . Heart attack Son   . Stroke Maternal Uncle     Surgical History Past Surgical History:  Procedure Laterality Date  . ABDOMINAL HYSTERECTOMY     vaginal approach, partial   . CARDIAC CATHETERIZATION  04/07/07   By Dr. Excell Seltzer -- 1. Severe LAD stenosis with successful PCI using a drug-eluting stent. 2. Severe left circumflex stenosis with successful PCI using a single  drug-eluting stent.  Marland Kitchen CARDIAC CATHETERIZATION N/A 10/07/2014   Procedure: Right/Left Heart Cath and Coronary Angiography;  Surgeon: Tonny Bollman, MD;  Location: [facility] INVASIVE CV LAB;  Service: Cardiovascular;  Laterality: N/A;  . EYE SURGERY     bilateral cataracts removed & /w IOL  . stents  2008  . TEE WITHOUT CARDIOVERSION N/A 11/26/2014   Procedure: TRANSESOPHAGEAL ECHOCARDIOGRAM (TEE);  Surgeon: Tonny Bollman, MD;   Location: [facility] OR;  Service: Open Heart Surgery;  Laterality: N/A;  . TRANSCATHETER AORTIC VALVE REPLACEMENT, TRANSFEMORAL N/A 11/26/2014   Procedure: TRANSCATHETER AORTIC VALVE REPLACEMENT, TRANSFEMORAL;  Surgeon: Tonny Bollman, MD;  Location: [facility] OR;  Service: Open Heart Surgery;  Laterality: N/A;  . TYMPANOMASTOIDECTOMY  2002   right    Allergies  Allergen Reactions  . Contrast Media [Iodinated Diagnostic Agents] Rash    Blister's    Current Outpatient Medications  Medication Sig Dispense Refill  . aspirin 81 MG tablet Take 81 mg by mouth daily.      Marland Kitchen atorvastatin (LIPITOR) 80 MG tablet Take 1 tablet (80 mg total) by mouth daily. 90 tablet 3  . glipiZIDE (GLUCOTROL) 5 MG tablet Take 5 mg by mouth daily.      Marland Kitchen ibuprofen (ADVIL,MOTRIN) 200 MG tablet Take 200 mg by mouth every 6 (six) hours as needed for mild pain or moderate pain.    Marland Kitchen lisinopril (PRINIVIL,ZESTRIL) 40 MG tablet TAKE 1 TABLET BY MOUTH EVERY DAY 30 tablet 11  . metoprolol tartrate (LOPRESSOR) 25 MG tablet TAKE 1 TABLET BY MOUTH TWICE A DAY 180 tablet 1  . metoprolol tartrate (LOPRESSOR) 50 MG tablet Take 50 mg by mouth daily.     No current facility-administered medications for this visit.     Review of Systems : See HPI for pertinent positives and negatives.  Physical Examination  Vitals:   03/07/18 1215 03/07/18 1217  BP: (!) 182/66 (!) 166/72  Pulse: (!) 56 63  Resp: 16   Temp: 97.9 F (36.6 C)   TempSrc: Oral   SpO2: 100% 97%  Weight: 106 lb 4.8 oz (48.2 kg)   Height: 4\' 11"  (1.499 m)    Body mass index is 21.47 kg/m.  General: WDWN female in NAD GAIT: normal Eyes: PERRLA HENT: No gross abnormalities.  Pulmonary:  Respirations are non-labored, good air movement in all fields, CTAB, no rales, rhonchi, or wheezing. Cardiac: regular rhythm, bradycardia (on a beta blocker), +murmur.   VASCULAR EXAM Carotid Bruits Right Left   Positive Positive     Abdominal aortic pulse is not palpable. Radial  pulses are 2+ palpable and equal.  LE Pulses Right Left       POPLITEAL  not palpable   not palpable       POSTERIOR TIBIAL   palpable    palpable        DORSALIS PEDIS      ANTERIOR TIBIAL  palpable   palpable     Gastrointestinal: soft, nontender, BS WNL, no r/g, no palpable masses. Musculoskeletal: no muscle atrophy/wasting. M/S 5/5 throughout, extremities without ischemic changes. Skin: No rashes, no ulcers, no cellulitis.   Neurologic:  A&O X 3; appropriate affect, sensation is normal; speech is normal, CN 2-12 intact, pain and light touch intact in extremities, motor exam as listed above. Psychiatric: Normal thought content, mood appropriate to clinical situation.    Assessment: LATOIA EYSTER is a 84 y.o. female who has no history of stroke or TIA. Her atherosclerotic risk factors include controlled DM, former smoker, and CAD.  Asymptomatic elevated blood pressure: daughter states she will be with pt for the next few days, will check her blood pressure at home, and if it remains above 150/90, she will call Dr. Ludwig Clarks office.   DATA Carotid Duplex (03-07-18): Right ICA: 1-39% stenosis Left ICA: 40-59% stenosis Right ECA: >50% stenosis Bilateral vertebral artery flow is antegrade.  Bilateral subclavian artery waveforms are normal.  No significant change compared to the exams on 02-23-16 and 03-01-17.    Plan: Follow-up in 1yearwith Carotid Duplex scan.  I discussed in depth with the patient the nature of atherosclerosis, and emphasized the importance of maximal medical management including strict control of blood pressure, blood glucose, and lipid levels, obtaining regular exercise, and continued cessation of smoking.  The patient is aware that without maximal medical management the underlying atherosclerotic disease process will progress,  limiting the benefit of any interventions. The patient was given information about stroke prevention and what symptoms should prompt the patient to seek immediate medical care. Thank you for allowing [place] to participate in this patient's care.  Charisse March, RN, MSN, FNP-C [facility] Office: [phone]  Clinic Physician: Bonnye Fava  03/07/18 12:30 PM

## 2018-06-21 ENCOUNTER — Other Ambulatory Visit: Payer: Self-pay | Admitting: Cardiovascular Disease

## 2018-06-21 DIAGNOSIS — I359 Nonrheumatic aortic valve disorder, unspecified: Secondary | ICD-10-CM

## 2018-06-21 DIAGNOSIS — I10 Essential (primary) hypertension: Secondary | ICD-10-CM

## 2018-06-22 ENCOUNTER — Other Ambulatory Visit: Payer: Self-pay | Admitting: Cardiology

## 2018-07-20 ENCOUNTER — Other Ambulatory Visit: Payer: Self-pay | Admitting: Cardiology

## 2018-07-20 NOTE — Telephone Encounter (Signed)
Rx(s) sent to pharmacy electronically.  

## 2018-07-21 NOTE — Progress Notes (Signed)
HPI: Follow-up coronary disease and aortic valve replacement. Patient had cardiac catheterization May 2016 that showed patent stents in the LAD and circumflex and no other obstructive disease. Ejection fraction 50%. Moderate mitral regurgitation and severe aortic stenosis. She underwent TAVR 7/16. She also has cerebrovascular disease followed by vascular surgery. Echo 7/17 showed EF 45-50, s/p AVR with normal gradients, mild to moderate MR, moderate LAE. Since last seen, the patient denies any dyspnea on exertion, orthopnea, PND, pedal edema, palpitations, syncope or chest pain.   Current Outpatient Medications  Medication Sig Dispense Refill  . amLODipine (NORVASC) 5 MG tablet TAKE 1 TABLET BY MOUTH EVERY DAY 90 tablet 3  . aspirin 81 MG tablet Take 81 mg by mouth daily.      Marland Kitchen atorvastatin (LIPITOR) 80 MG tablet Take 1 tablet (80 mg total) by mouth daily. 90 tablet 0  . glipiZIDE (GLUCOTROL) 5 MG tablet Take 5 mg by mouth daily.      Marland Kitchen ibuprofen (ADVIL,MOTRIN) 200 MG tablet Take 200 mg by mouth every 6 (six) hours as needed for mild pain or moderate pain.    Marland Kitchen lisinopril (PRINIVIL,ZESTRIL) 40 MG tablet TAKE 1 TABLET BY MOUTH EVERY DAY 30 tablet 11  . metoprolol tartrate (LOPRESSOR) 25 MG tablet Take 1 tablet (25 mg total) by mouth 2 (two) times daily. Please keep upcoming appt for further refills. 180 tablet 1   No current facility-administered medications for this visit.      Past Medical History:  Diagnosis Date  . Aortic insufficiency   . Arthritis    told that she has OA- pt. reports that she doesn't have any pain from it but she reports that she has back pain from time to time   . CAD (coronary artery disease)    a. 09/2014 Cath: patent LAD/LCX stents, otw nonobs dzs.  . Carotid artery occlusion   . CHF (congestive heart failure) (HCC)   . CVD (cerebrovascular disease)   . Diabetes mellitus type 2, controlled (HCC)   . GERD (gastroesophageal reflux disease)   . History  of hiatal hernia   . Hyperlipidemia   . Hypertension   . Mitral regurgitation   . PVD (peripheral vascular disease) (HCC)   . Severe aortic stenosis    a. 09/2014 Echo: EF 40%, gr2 DD, sev AS (mean 34 mmHg, peak 57 mmHg), mod MR, mild LAE, nl RV, mild TR, triv PI, PASP 41 mmHg;  b. 11/2014 TAVR: Randa Evens Sapien 3 THV (size 33mm) via L femoral cutdown;  c. 11/2014 postop Echo: EF 45-50%, no rwma, Gr 1 DD, minimally increased tranvsvalvular velocity, no AI/AS, mild MR, mildly dil LA, PASP .  Marland Kitchen Shortness of breath dyspnea     Past Surgical History:  Procedure Laterality Date  . ABDOMINAL HYSTERECTOMY     vaginal approach, partial   . CARDIAC CATHETERIZATION  04/07/07   By Dr. Excell Seltzer -- 1. Severe LAD stenosis with successful PCI using a drug-eluting stent. 2. Severe left circumflex stenosis with successful PCI using a single  drug-eluting stent.  Marland Kitchen CARDIAC CATHETERIZATION N/A 10/07/2014   Procedure: Right/Left Heart Cath and Coronary Angiography;  Surgeon: Tonny Bollman, MD;  Location: [facility] INVASIVE CV LAB;  Service: Cardiovascular;  Laterality: N/A;  . EYE SURGERY     bilateral cataracts removed & /w IOL  . stents  2008  . TEE WITHOUT CARDIOVERSION N/A 11/26/2014   Procedure: TRANSESOPHAGEAL ECHOCARDIOGRAM (TEE);  Surgeon: Tonny Bollman, MD;  Location: [facility] OR;  Service:  Open Heart Surgery;  Laterality: N/A;  . TRANSCATHETER AORTIC VALVE REPLACEMENT, TRANSFEMORAL N/A 11/26/2014   Procedure: TRANSCATHETER AORTIC VALVE REPLACEMENT, TRANSFEMORAL;  Surgeon: Tonny Bollman, MD;  Location: [facility] OR;  Service: Open Heart Surgery;  Laterality: N/A;  . TYMPANOMASTOIDECTOMY  2002   right    Social History   Socioeconomic History  . Marital status: Widowed    Spouse name: Not on file  . Number of children: Not on file  . Years of education: Not on file  . Highest education level: Not on file  Occupational History  . 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 Financial  resource strain: Not on file  . Food insecurity:    Worry: Not on file    Inability: Not on file  . Transportation needs:    Medical: Not on file    Non-medical: Not on file  Tobacco Use  . Smoking status: Former Smoker    Packs/day: 0.40    Years: 5.00    Pack years: 2.00    Types: Cigarettes    Last attempt to quit: 05/24/2006    Years since quitting: 12.1  . Smokeless tobacco: Never Used  Substance and Sexual Activity  . Alcohol use: Yes    Alcohol/week: 0.0 standard drinks    Comment: wine- Glass/ per day   . Drug use: No  . Sexual activity: Not on file  Lifestyle  . Physical activity:    Days per week: Not on file    Minutes per session: Not on file  . Stress: Not on file  Relationships  . Social connections:    Talks on phone: Not on file    Gets together: Not on file    Attends religious service: Not on file    Active member of club or organization: Not on file    Attends meetings of clubs or organizations: Not on file    Relationship status: Not on file  . Intimate partner violence:    Fear of current or ex partner: Not on file    Emotionally abused: Not on file    Physically abused: Not on file    Forced sexual activity: Not on file  Other Topics Concern  . Not on file  Social History Narrative  . Not on file    Family History  Problem Relation Age of Onset  . Heart attack Mother   . Heart disease Mother        Before age 102  . Hyperlipidemia Mother   . Hypertension Mother   . Heart disease Sister        Before age 70  . Hyperlipidemia Sister   . Hypertension Sister   . Heart disease Brother        Before age 53  . Hyperlipidemia Brother   . Heart attack Brother   . Heart disease Brother        Before age 61-  Stents - CHF  . Pneumonia Other   . Heart disease Son 23       After age 25  . Heart attack Sister   . Heart attack Son   . Stroke Maternal Uncle     ROS: no fevers or chills, productive cough, hemoptysis, dysphasia, odynophagia,  melena, hematochezia, dysuria, hematuria, rash, seizure activity, orthopnea, PND, pedal edema, claudication. Remaining systems are negative.  Physical Exam: Well-developed well-nourished in no acute distress.  Skin is warm and dry.  HEENT is normal.  Neck is supple.  Chest is clear to auscultation with normal expansion.  Cardiovascular exam is regular rate and rhythm. 2/6 systolic murmur, no DM Abdominal exam nontender or distended. No masses palpated. Extremities show no edema. neuro grossly intact  ECG-sinus rhythm at a rate of 58, PVC, left bundle branch block.  Personally reviewed  A/P  1 coronary artery disease-patient denies chest pain.  Continue medical therapy with aspirin and statin.  2 hypertension-patient's blood pressure is controlled.  Continue present medications and follow.  Check potassium and renal function.  3 hyperlipidemia-continue statin.  Check lipids and liver.  4 prior aortic valve replacement-continue SBE prophylaxis. Repeat echo.  5 carotid artery disease-continue medical therapy with aspirin and statin.  Followed by vascular surgery.  Olga Millers, MD

## 2018-07-25 ENCOUNTER — Ambulatory Visit (INDEPENDENT_AMBULATORY_CARE_PROVIDER_SITE_OTHER): Payer: Medicare Other | Admitting: Cardiology

## 2018-07-25 ENCOUNTER — Encounter: Payer: Self-pay | Admitting: Cardiology

## 2018-07-25 VITALS — BP 138/60 | HR 58 | Ht 59.0 in | Wt 110.0 lb

## 2018-07-25 DIAGNOSIS — I359 Nonrheumatic aortic valve disorder, unspecified: Secondary | ICD-10-CM

## 2018-07-25 DIAGNOSIS — I10 Essential (primary) hypertension: Secondary | ICD-10-CM | POA: Diagnosis not present

## 2018-07-25 DIAGNOSIS — I2510 Atherosclerotic heart disease of native coronary artery without angina pectoris: Secondary | ICD-10-CM

## 2018-07-25 DIAGNOSIS — E7800 Pure hypercholesterolemia, unspecified: Secondary | ICD-10-CM | POA: Diagnosis not present

## 2018-07-25 NOTE — Patient Instructions (Signed)
Medication Instructions:  NO CHANGE If you need a refill on your cardiac medications before your next appointment, please call your pharmacy.   Lab work: Your physician recommends that you return for lab work WHEN FASTING If you have labs (blood work) drawn today and your tests are completely normal, you will receive your results only by: Marland Kitchen MyChart Message (if you have MyChart) OR . A paper copy in the mail If you have any lab test that is abnormal or we need to change your treatment, we will call you to review the results.  Testing/Procedures: Your physician has requested that you have an echocardiogram. Echocardiography is a painless test that uses sound waves to create images of your heart. It provides your doctor with information about the size and shape of your heart and how well your heart's chambers and valves are working. This procedure takes approximately one hour. There are no restrictions for this procedure.  SCHEDULE AT THE [place] OFFICE PLEASE  Follow-Up: Your physician recommends that you schedule a follow-up appointment in: 12 MONTHS WITH DR CRENSHAW IN [place]

## 2018-07-28 ENCOUNTER — Ambulatory Visit (HOSPITAL_BASED_OUTPATIENT_CLINIC_OR_DEPARTMENT_OTHER)
Admission: RE | Admit: 2018-07-28 | Discharge: 2018-07-28 | Disposition: A | Discharge status: Home / Self Care | Payer: Medicare Other | Source: Ambulatory Visit | Attending: Cardiology | Admitting: Cardiology

## 2018-07-28 DIAGNOSIS — I359 Nonrheumatic aortic valve disorder, unspecified: Secondary | ICD-10-CM | POA: Insufficient documentation

## 2018-07-28 NOTE — Progress Notes (Signed)
  Echocardiogram 2D Echocardiogram has been performed.  Kara Santiago 07/28/2018, 11:10 AM

## 2018-07-29 LAB — COMPREHENSIVE METABOLIC PANEL
A/G RATIO: 1.5 (ref 1.2–2.2)
ALK PHOS: 112 IU/L (ref 39–117)
ALT: 20 IU/L (ref 0–32)
AST: 21 IU/L (ref 0–40)
Albumin: 4.3 g/dL (ref 3.5–4.6)
BILIRUBIN TOTAL: 0.3 mg/dL (ref 0.0–1.2)
BUN/Creatinine Ratio: 23 (ref 12–28)
BUN: 16 mg/dL (ref 10–36)
CHLORIDE: 105 mmol/L (ref 96–106)
CO2: 24 mmol/L (ref 20–29)
Calcium: 9.6 mg/dL (ref 8.7–10.3)
Creatinine, Ser: 0.69 mg/dL (ref 0.57–1.00)
GFR calc Af Amer: 89 mL/min/{1.73_m2} (ref >59)
GFR calc non Af Amer: 77 mL/min/{1.73_m2} (ref >59)
GLUCOSE: 101 mg/dL — AB (ref 65–99)
Globulin, Total: 2.9 g/dL (ref 1.5–4.5)
POTASSIUM: 5.1 mmol/L (ref 3.5–5.2)
Sodium: 142 mmol/L (ref 134–144)
TOTAL PROTEIN: 7.2 g/dL (ref 6.0–8.5)

## 2018-07-29 LAB — LIPID PANEL
CHOL/HDL RATIO: 3.4 ratio (ref 0.0–4.4)
Cholesterol, Total: 171 mg/dL (ref 100–199)
HDL: 51 mg/dL (ref >39)
LDL CALC: 96 mg/dL (ref 0–99)
TRIGLYCERIDES: 122 mg/dL (ref 0–149)
VLDL Cholesterol Cal: 24 mg/dL (ref 5–40)

## 2018-07-31 ENCOUNTER — Encounter: Payer: Self-pay | Admitting: *Deleted

## 2018-08-01 ENCOUNTER — Other Ambulatory Visit: Payer: Self-pay

## 2018-08-01 MED ORDER — LISINOPRIL 40 MG PO TABS: 40.0000 mg | ORAL_TABLET | Freq: Every day | ORAL | 11 refills | Status: DC

## 2018-08-01 NOTE — Telephone Encounter (Signed)
Rx(s) sent to pharmacy electronically.  

## 2018-10-17 ENCOUNTER — Other Ambulatory Visit: Payer: Self-pay | Admitting: Cardiology

## 2018-10-17 NOTE — Telephone Encounter (Signed)
Rx request sent to pharmacy.  

## 2018-11-20 ENCOUNTER — Inpatient Hospital Stay (HOSPITAL_COMMUNITY)
Admission: EM | Admit: 2018-11-20 | Discharge: 2018-11-24 | DRG: 177 | Disposition: A | Discharge status: Home Health | Payer: Medicare Other | Attending: Internal Medicine | Admitting: Internal Medicine

## 2018-11-20 ENCOUNTER — Emergency Department (HOSPITAL_COMMUNITY): Payer: Medicare Other

## 2018-11-20 ENCOUNTER — Encounter (HOSPITAL_COMMUNITY): Payer: Self-pay | Admitting: Student

## 2018-11-20 ENCOUNTER — Other Ambulatory Visit: Payer: Self-pay

## 2018-11-20 DIAGNOSIS — Z8673 Personal history of transient ischemic attack (TIA), and cerebral infarction without residual deficits: Secondary | ICD-10-CM

## 2018-11-20 DIAGNOSIS — Z91041 Radiographic dye allergy status: Secondary | ICD-10-CM

## 2018-11-20 DIAGNOSIS — E871 Hypo-osmolality and hyponatremia: Secondary | ICD-10-CM

## 2018-11-20 DIAGNOSIS — I080 Rheumatic disorders of both mitral and aortic valves: Secondary | ICD-10-CM | POA: Diagnosis present

## 2018-11-20 DIAGNOSIS — Z9981 Dependence on supplemental oxygen: Secondary | ICD-10-CM

## 2018-11-20 DIAGNOSIS — Z7982 Long term (current) use of aspirin: Secondary | ICD-10-CM

## 2018-11-20 DIAGNOSIS — I110 Hypertensive heart disease with heart failure: Secondary | ICD-10-CM | POA: Diagnosis present

## 2018-11-20 DIAGNOSIS — E1151 Type 2 diabetes mellitus with diabetic peripheral angiopathy without gangrene: Secondary | ICD-10-CM | POA: Diagnosis present

## 2018-11-20 DIAGNOSIS — I2510 Atherosclerotic heart disease of native coronary artery without angina pectoris: Secondary | ICD-10-CM | POA: Diagnosis present

## 2018-11-20 DIAGNOSIS — I10 Essential (primary) hypertension: Secondary | ICD-10-CM | POA: Diagnosis present

## 2018-11-20 DIAGNOSIS — I5032 Chronic diastolic (congestive) heart failure: Secondary | ICD-10-CM | POA: Diagnosis present

## 2018-11-20 DIAGNOSIS — U071 COVID-19: Secondary | ICD-10-CM | POA: Diagnosis not present

## 2018-11-20 DIAGNOSIS — Z952 Presence of prosthetic heart valve: Secondary | ICD-10-CM

## 2018-11-20 DIAGNOSIS — J1282 Pneumonia due to coronavirus disease 2019: Secondary | ICD-10-CM

## 2018-11-20 DIAGNOSIS — E785 Hyperlipidemia, unspecified: Secondary | ICD-10-CM | POA: Diagnosis present

## 2018-11-20 DIAGNOSIS — M1990 Unspecified osteoarthritis, unspecified site: Secondary | ICD-10-CM | POA: Diagnosis present

## 2018-11-20 DIAGNOSIS — E876 Hypokalemia: Secondary | ICD-10-CM

## 2018-11-20 DIAGNOSIS — Z7984 Long term (current) use of oral hypoglycemic drugs: Secondary | ICD-10-CM

## 2018-11-20 DIAGNOSIS — Z8349 Family history of other endocrine, nutritional and metabolic diseases: Secondary | ICD-10-CM

## 2018-11-20 DIAGNOSIS — R0902 Hypoxemia: Secondary | ICD-10-CM

## 2018-11-20 DIAGNOSIS — Z79899 Other long term (current) drug therapy: Secondary | ICD-10-CM

## 2018-11-20 DIAGNOSIS — Z8249 Family history of ischemic heart disease and other diseases of the circulatory system: Secondary | ICD-10-CM

## 2018-11-20 DIAGNOSIS — Z955 Presence of coronary angioplasty implant and graft: Secondary | ICD-10-CM

## 2018-11-20 DIAGNOSIS — J9601 Acute respiratory failure with hypoxia: Secondary | ICD-10-CM | POA: Diagnosis not present

## 2018-11-20 DIAGNOSIS — Z66 Do not resuscitate: Secondary | ICD-10-CM | POA: Diagnosis present

## 2018-11-20 DIAGNOSIS — Z823 Family history of stroke: Secondary | ICD-10-CM

## 2018-11-20 DIAGNOSIS — J069 Acute upper respiratory infection, unspecified: Secondary | ICD-10-CM

## 2018-11-20 DIAGNOSIS — J1289 Other viral pneumonia: Secondary | ICD-10-CM | POA: Diagnosis present

## 2018-11-20 DIAGNOSIS — K219 Gastro-esophageal reflux disease without esophagitis: Secondary | ICD-10-CM | POA: Diagnosis present

## 2018-11-20 LAB — SARS CORONAVIRUS 2 BY RT PCR (HOSPITAL ORDER, PERFORMED IN ~~LOC~~ HOSPITAL LAB): SARS Coronavirus 2: POSITIVE — AB

## 2018-11-20 LAB — HEPATIC FUNCTION PANEL
ALT: 22 U/L (ref 0–44)
AST: 36 U/L (ref 15–41)
Albumin: 3 g/dL — ABNORMAL LOW (ref 3.5–5.0)
Alkaline Phosphatase: 70 U/L (ref 38–126)
Bilirubin, Direct: 0.2 mg/dL (ref 0.0–0.2)
Indirect Bilirubin: 0.5 mg/dL (ref 0.3–0.9)
Total Bilirubin: 0.7 mg/dL (ref 0.3–1.2)
Total Protein: 7 g/dL (ref 6.5–8.1)

## 2018-11-20 LAB — BASIC METABOLIC PANEL
Anion gap: 11 (ref 5–15)
BUN: 11 mg/dL (ref 8–23)
CO2: 24 mmol/L (ref 22–32)
Calcium: 8.5 mg/dL — ABNORMAL LOW (ref 8.9–10.3)
Chloride: 98 mmol/L (ref 98–111)
Creatinine, Ser: 0.73 mg/dL (ref 0.44–1.00)
GFR calc Af Amer: >60 mL/min (ref >60)
GFR calc non Af Amer: >60 mL/min (ref >60)
Glucose, Bld: 105 mg/dL — ABNORMAL HIGH (ref 70–99)
Potassium: 3.3 mmol/L — ABNORMAL LOW (ref 3.5–5.1)
Sodium: 133 mmol/L — ABNORMAL LOW (ref 135–145)

## 2018-11-20 LAB — CBC
HCT: 36.5 % (ref 36.0–46.0)
Hemoglobin: 12.5 g/dL (ref 12.0–15.0)
MCH: 31.6 pg (ref 26.0–34.0)
MCHC: 34.2 g/dL (ref 30.0–36.0)
MCV: 92.4 fL (ref 80.0–100.0)
Platelets: 222 10*3/uL (ref 150–400)
RBC: 3.95 MIL/uL (ref 3.87–5.11)
RDW: 14.1 % (ref 11.5–15.5)
WBC: 4.9 10*3/uL (ref 4.0–10.5)
nRBC: 0 % (ref 0.0–0.2)

## 2018-11-20 LAB — TROPONIN I (HIGH SENSITIVITY)
Troponin I (High Sensitivity): 9 ng/L (ref <18)
Troponin I (High Sensitivity): 9 ng/L (ref <18)

## 2018-11-20 LAB — LACTIC ACID, PLASMA
Lactic Acid, Venous: 1 mmol/L (ref 0.5–1.9)
Lactic Acid, Venous: 0.8 mmol/L (ref 0.5–1.9)

## 2018-11-20 LAB — D-DIMER, QUANTITATIVE: D-Dimer, Quant: 1.26 ug/mL-FEU — ABNORMAL HIGH (ref 0.00–0.50)

## 2018-11-20 LAB — TRIGLYCERIDES: Triglycerides: 100 mg/dL (ref <150)

## 2018-11-20 LAB — FERRITIN: Ferritin: 301 ng/mL (ref 11–307)

## 2018-11-20 LAB — PROCALCITONIN: Procalcitonin: <0.1 ng/mL

## 2018-11-20 LAB — C-REACTIVE PROTEIN: CRP: 10.7 mg/dL — ABNORMAL HIGH (ref <1.0)

## 2018-11-20 LAB — FIBRINOGEN: Fibrinogen: 648 mg/dL — ABNORMAL HIGH (ref 210–475)

## 2018-11-20 LAB — LACTATE DEHYDROGENASE: LDH: 293 U/L — ABNORMAL HIGH (ref 98–192)

## 2018-11-20 MED ORDER — METHYLPREDNISOLONE SODIUM SUCC 40 MG IJ SOLR: 25.0000 mg | Freq: Two times a day (BID) | INTRAMUSCULAR | Status: DC

## 2018-11-20 MED ORDER — LORAZEPAM 2 MG/ML IJ SOLN: 1.0000 mg | Freq: Four times a day (QID) | INTRAMUSCULAR | Status: DC | PRN

## 2018-11-20 MED ORDER — THIAMINE HCL 100 MG/ML IJ SOLN
100.0000 mg | Freq: Every day | INTRAMUSCULAR | Status: DC
  Filled 2018-11-20: qty 2

## 2018-11-20 MED ORDER — ASPIRIN 81 MG PO CHEW
81.0000 mg | CHEWABLE_TABLET | Freq: Every day | ORAL | Status: DC
  Administered 2018-11-21 – 2018-11-24 (×4): 81 mg via ORAL
  Filled 2018-11-20 (×4): qty 1

## 2018-11-20 MED ORDER — ENOXAPARIN SODIUM 40 MG/0.4ML ~~LOC~~ SOLN
40.0000 mg | SUBCUTANEOUS | Status: DC
  Administered 2018-11-21: 40 mg via SUBCUTANEOUS
  Filled 2018-11-20: qty 0.4

## 2018-11-20 MED ORDER — LORAZEPAM 0.5 MG PO TABS: 1.0000 mg | ORAL_TABLET | Freq: Four times a day (QID) | ORAL | Status: DC | PRN

## 2018-11-20 MED ORDER — SODIUM CHLORIDE 0.9% FLUSH
3.0000 mL | Freq: Once | INTRAVENOUS | Status: AC
  Administered 2018-11-21: 3 mL via INTRAVENOUS

## 2018-11-20 MED ORDER — METOPROLOL TARTRATE 25 MG PO TABS
25.0000 mg | ORAL_TABLET | Freq: Two times a day (BID) | ORAL | Status: DC
  Administered 2018-11-21 – 2018-11-24 (×7): 25 mg via ORAL
  Filled 2018-11-20 (×7): qty 1

## 2018-11-20 MED ORDER — GUAIFENESIN-DM 100-10 MG/5ML PO SYRP
10.0000 mL | ORAL_SOLUTION | ORAL | Status: DC | PRN
  Administered 2018-11-21 – 2018-11-24 (×5): 10 mL via ORAL
  Filled 2018-11-20 (×5): qty 10

## 2018-11-20 MED ORDER — POTASSIUM CHLORIDE CRYS ER 20 MEQ PO TBCR
40.0000 meq | EXTENDED_RELEASE_TABLET | Freq: Once | ORAL | Status: AC
  Administered 2018-11-20: 40 meq via ORAL
  Filled 2018-11-20: qty 2

## 2018-11-20 MED ORDER — VITAMIN B-1 100 MG PO TABS
100.0000 mg | ORAL_TABLET | Freq: Every day | ORAL | Status: DC
  Administered 2018-11-21 – 2018-11-24 (×4): 100 mg via ORAL
  Filled 2018-11-20 (×4): qty 1

## 2018-11-20 MED ORDER — HYDROCOD POLST-CPM POLST ER 10-8 MG/5ML PO SUER
5.0000 mL | Freq: Two times a day (BID) | ORAL | Status: DC | PRN
  Administered 2018-11-21: 5 mL via ORAL
  Filled 2018-11-20: qty 5

## 2018-11-20 MED ORDER — ADULT MULTIVITAMIN W/MINERALS CH
1.0000 | ORAL_TABLET | Freq: Every day | ORAL | Status: DC
  Administered 2018-11-21 – 2018-11-24 (×4): 1 via ORAL
  Filled 2018-11-20 (×4): qty 1

## 2018-11-20 MED ORDER — ACETAMINOPHEN 325 MG PO TABS
650.0000 mg | ORAL_TABLET | Freq: Four times a day (QID) | ORAL | Status: DC | PRN
  Administered 2018-11-22: 22:00:00 650 mg via ORAL
  Filled 2018-11-20: qty 2

## 2018-11-20 MED ORDER — ACETAMINOPHEN 325 MG PO TABS
650.0000 mg | ORAL_TABLET | Freq: Once | ORAL | Status: AC
  Administered 2018-11-20: 650 mg via ORAL
  Filled 2018-11-20: qty 2

## 2018-11-20 MED ORDER — AMLODIPINE BESYLATE 5 MG PO TABS
5.0000 mg | ORAL_TABLET | Freq: Every day | ORAL | Status: DC
  Administered 2018-11-21 – 2018-11-24 (×4): 5 mg via ORAL
  Filled 2018-11-20 (×4): qty 1

## 2018-11-20 MED ORDER — ATORVASTATIN CALCIUM 40 MG PO TABS
80.0000 mg | ORAL_TABLET | Freq: Every day | ORAL | Status: DC
  Administered 2018-11-21 – 2018-11-24 (×4): 80 mg via ORAL
  Filled 2018-11-20 (×4): qty 2

## 2018-11-20 MED ORDER — SODIUM CHLORIDE 0.9 % IV BOLUS
500.0000 mL | Freq: Once | INTRAVENOUS | Status: AC
  Administered 2018-11-20: 500 mL via INTRAVENOUS

## 2018-11-20 MED ORDER — FOLIC ACID 1 MG PO TABS
1.0000 mg | ORAL_TABLET | Freq: Every day | ORAL | Status: DC
  Administered 2018-11-21 – 2018-11-24 (×4): 1 mg via ORAL
  Filled 2018-11-20 (×3): qty 1

## 2018-11-20 MED ORDER — MAGNESIUM OXIDE 400 (241.3 MG) MG PO TABS
200.0000 mg | ORAL_TABLET | Freq: Once | ORAL | Status: AC
  Administered 2018-11-20: 200 mg via ORAL
  Filled 2018-11-20: qty 1

## 2018-11-20 NOTE — H&P (Signed)
History and Physical    Kara BrunnerBeatrice S Santiago NGE:[id number] DOB: 07-Mar-1926 DOA: 11/20/2018  PCP: Kara Santiago Patient coming from: Home  Chief Complaint: Shortness of breath, cough, fever  HPI: Kara BrunnerBeatrice S Santiago is a 85 y.o. female with medical history significant of CAD status post PCI, CHF, CVD, type 2 diabetes, hypertension, hyperlipidemia, GERD, PVD, moderate mitral regurgitation, severe aortic stenosis status TAVR in July 2016 presenting to the hospital for evaluation of shortness of breath, cough, and fever.  Patient reports 4-day history of shortness of breath, productive cough, and fevers.  States her chest feels sore after coughing, no chest pain otherwise.  States she has not been eating or drinking much for the past few days.  No abdominal pain, vomiting, or diarrhea.  Denies any recent known sick contacts.  ED Course: COVID-19 rapid test positive.  SPO2 88% on room air with ambulation and placed on 2 L supplemental oxygen.  Temperature 101.9 F.  No tachycardia or tachypnea.  No hypotension.  No leukocytosis.  Lactic acid normal x2.  Sodium 133, potassium 3.3.  High-sensitivity troponin 9 >9.  EKG without acute ischemic changes.  D-dimer 1.26.  Procalcitonin <0.10.  LDH 293.  Ferritin within normal range.  Fibrinogen 648.  CRP 10.7.  LFTs normal.  Blood culture x2 pending.  Chest x-ray showing patchy indistinct opacity in the left midlung suspicious for acute infection such as bronchopneumonia, viral versus atypical etiology.  Patient received a 500 cc fluid bolus, Tylenol, magnesium oxide 200 mg, and K. Dur 40 mEq in the ED.  Review of Systems:  All systems reviewed and apart from history of presenting illness, are negative.  Past Medical History:  Diagnosis Date   Aortic insufficiency    Arthritis    told that she has OA- pt. reports that she doesn't have any pain from it but she reports that she has back pain from time to time    CAD (coronary artery disease)    a. 09/2014  Cath: patent LAD/LCX stents, otw nonobs dzs.   Carotid artery occlusion    CHF (congestive heart failure) (HCC)    CVD (cerebrovascular disease)    Diabetes mellitus type 2, controlled (HCC)    GERD (gastroesophageal reflux disease)    History of hiatal hernia    Hyperlipidemia    Hypertension    Mitral regurgitation    PVD (peripheral vascular disease) (HCC)    Severe aortic stenosis    a. 09/2014 Echo: EF 40%, gr2 DD, sev AS (mean 34 mmHg, peak 57 mmHg), mod MR, mild LAE, nl RV, mild TR, triv PI, PASP 41 mmHg;  b. 11/2014 TAVR: Randa EvensEdwards Sapien 3 THV (size 23mm) via L femoral cutdown;  c. 11/2014 postop Echo: EF 45-50%, no rwma, Gr 1 DD, minimally increased tranvsvalvular velocity, no AI/AS, mild MR, mildly dil LA, PASP 41mmHg.   Shortness of breath dyspnea     Past Surgical History:  Procedure Laterality Date   ABDOMINAL HYSTERECTOMY     vaginal approach, partial    CARDIAC CATHETERIZATION  04/07/07   By Dr. Excell Seltzerooper -- 1. Severe LAD stenosis with successful PCI using a drug-eluting stent. 2. Severe left circumflex stenosis with successful PCI using a single  drug-eluting stent.   CARDIAC CATHETERIZATION N/A 10/07/2014   Procedure: Right/Left Heart Cath and Coronary Angiography;  Surgeon: Tonny BollmanMichael Cooper, Santiago;  [facility];  Service: Cardiovascular;  Laterality: N/A;   EYE SURGERY     bilateral cataracts removed & /w IOL  stents  2008   TEE WITHOUT CARDIOVERSION N/A 11/26/2014   Procedure: TRANSESOPHAGEAL ECHOCARDIOGRAM (TEE);  Surgeon: Sherren Mocha, Santiago;  Location: [place];  Service: Open Heart Surgery;  Laterality: N/A;   TRANSCATHETER AORTIC VALVE REPLACEMENT, TRANSFEMORAL N/A 11/26/2014   Procedure: TRANSCATHETER AORTIC VALVE REPLACEMENT, TRANSFEMORAL;  Surgeon: Sherren Mocha, Santiago;  Location: [place];  Service: Open Heart Surgery;  Laterality: N/A;   TYMPANOMASTOIDECTOMY  2002   right     reports that she quit smoking about 12 years ago. Her smoking use  included cigarettes. She has a 2.00 pack-year smoking history. She has never used smokeless tobacco. She reports current alcohol use. She reports that she does not use drugs.  Allergies  Allergen Reactions   Contrast Media [Iodinated Diagnostic Agents] Rash    Blister's    Family History  Problem Relation Age of Onset   Heart attack Mother    Heart disease Mother        Before age 93   Hyperlipidemia Mother    Hypertension Mother    Heart disease Sister        Before age 55   Hyperlipidemia Sister    Hypertension Sister    Heart disease Brother        Before age 30   Hyperlipidemia Brother    Heart attack Brother    Heart disease Brother        Before age 49-  Stents - CHF   Pneumonia Other    Heart disease Son 51       After age 1   Heart attack Sister    Heart attack Son    Stroke Maternal Uncle     Prior to Admission medications   Medication Sig Start Date End Date Taking? Authorizing Provider  acetaminophen (TYLENOL) 500 MG tablet Take 1,000 mg by mouth every 6 (six) hours as needed for mild pain or moderate pain.   Yes Provider, Historical, Santiago  amLODipine (NORVASC) 5 MG tablet TAKE 1 TABLET BY MOUTH EVERY DAY Patient taking differently: Take 5 mg by mouth daily.  06/22/18  Yes Lelon Perla, Santiago  aspirin 81 MG tablet Take 81 mg by mouth daily.     Yes Provider, Historical, Santiago  atorvastatin (LIPITOR) 80 MG tablet TAKE 1 TABLET BY MOUTH EVERY DAY Patient taking differently: Take 80 mg by mouth daily at 6 PM.  10/17/18  Yes Crenshaw, Denice Bors, Santiago  glipiZIDE (GLUCOTROL) 5 MG tablet Take 5 mg by mouth daily.     Yes Provider, Historical, Santiago  guaiFENesin (MUCINEX) 600 MG 12 hr tablet Take 1,200 mg by mouth 2 (two) times daily as needed for cough or to loosen phlegm.   Yes Provider, Historical, Santiago  ibuprofen (ADVIL,MOTRIN) 200 MG tablet Take 200 mg by mouth every 6 (six) hours as needed for mild pain or moderate pain.   Yes Provider, Historical, Santiago    lisinopril (PRINIVIL,ZESTRIL) 40 MG tablet Take 1 tablet (40 mg total) by mouth daily. 08/01/18  Yes Lelon Perla, Santiago  metoprolol tartrate (LOPRESSOR) 25 MG tablet Take 1 tablet (25 mg total) by mouth 2 (two) times daily. Please keep upcoming appt for further refills. 06/21/18  Yes Sherren Mocha, Santiago    Physical Exam: Vitals:   11/21/18 0200 11/21/18 0300 11/21/18 0400 11/21/18 0500  BP: (!) 159/64 (!) 145/60 137/63 (!) 143/60  Pulse: 69 70 70 68  Resp: 18 19 19 20   Temp:    99.2 F (37.3 C)  TempSrc:  Oral  SpO2: 98% 99% 100% 97%  Weight:      Height:        Physical Exam  Constitutional: She is oriented to person, place, and time. She appears well-developed and well-nourished. No distress.  Frail elderly female  HENT:  Head: Normocephalic.  Mouth/Throat: Oropharynx is clear and moist.  Eyes: Right eye exhibits no discharge. Left eye exhibits no discharge.  Neck: Neck supple.  Cardiovascular: Normal rate, regular rhythm and intact distal pulses.  Pulmonary/Chest: She has no wheezes.  Slightly increased work of breathing On 2 L supplemental oxygen Bibasilar rales, L >R  Abdominal: Soft. Bowel sounds are normal. She exhibits no distension. There is no abdominal tenderness. There is no guarding.  Musculoskeletal:        General: No edema.  Neurological: She is alert and oriented to person, place, and time.  Skin: Skin is warm and dry. She is not diaphoretic.     Labs on Admission: I have personally reviewed following labs and imaging studies  CBC: Recent Labs  Lab 11/20/18 1359  WBC 4.9  HGB 12.5  HCT 36.5  MCV 92.4  PLT 222   Basic Metabolic Panel: Recent Labs  Lab 11/20/18 1359  NA 133*  K 3.3*  CL 98  CO2 24  GLUCOSE 105*  BUN 11  CREATININE 0.73  CALCIUM 8.5*   GFR: Estimated Creatinine Clearance: 31.2 mL/min (by C-G formula based on SCr of 0.73 mg/dL). Liver Function Tests: Recent Labs  Lab 11/20/18 1555  AST 36  ALT 22  ALKPHOS 70   BILITOT 0.7  PROT 7.0  ALBUMIN 3.0*   No results for input(s): LIPASE, AMYLASE in the last 168 hours. No results for input(s): AMMONIA in the last 168 hours. Coagulation Profile: No results for input(s): INR, PROTIME in the last 168 hours. Cardiac Enzymes: No results for input(s): CKTOTAL, CKMB, CKMBINDEX, TROPONINI in the last 168 hours. BNP (last 3 results) No results for input(s): PROBNP in the last 8760 hours. HbA1C: No results for input(s): HGBA1C in the last 72 hours. CBG: No results for input(s): GLUCAP in the last 168 hours. Lipid Profile: Recent Labs    11/20/18 1555  TRIG 100   Thyroid Function Tests: No results for input(s): TSH, T4TOTAL, FREET4, T3FREE, THYROIDAB in the last 72 hours. Anemia Panel: Recent Labs    11/20/18 1555  FERRITIN 301   Urine analysis:    Component Value Date/Time   COLORURINE YELLOW 11/27/2014 0731   APPEARANCEUR HAZY (A) 11/27/2014 0731   LABSPEC 1.023 11/27/2014 0731   PHURINE 5.0 11/27/2014 0731   GLUCOSEU NEGATIVE 11/27/2014 0731   HGBUR MODERATE (A) 11/27/2014 0731   BILIRUBINUR NEGATIVE 11/27/2014 0731   KETONESUR NEGATIVE 11/27/2014 0731   PROTEINUR NEGATIVE 11/27/2014 0731   UROBILINOGEN 0.2 11/27/2014 0731   NITRITE NEGATIVE 11/27/2014 0731   LEUKOCYTESUR NEGATIVE 11/27/2014 0731    Radiological Exams on Admission: Dg Chest 2 View  Result Date: 11/20/2018 CLINICAL DATA:  85 year old female with chest pain and shortness of breath EXAM: CHEST - 2 VIEW COMPARISON:  Chest CT 07/14/2015 and earlier. FINDINGS: Upright AP and lateral views. Sequelae of TAVR redemonstrated. Low lung volumes today. Asymmetric and indistinct opacity in the left mid lung (arrow). Pulmonary vascularity appears stable and within normal limits. No pneumothorax, pleural effusion, or other confluent pulmonary opacity. Visualized tracheal air column is within normal limits. Chronic spinal compression fractures. Stable visualized osseous structures.  Calcified aortic atherosclerosis. Negative visible bowel gas pattern. IMPRESSION: 1. Lower  lung volumes with patchy indistinct opacity in the left mid lung. This is nonspecific but suspicious for acute infection such as bronchopneumonia. Consider viral/atypical etiologies. No pleural effusion. 2.  Aortic Atherosclerosis (ICD10-I70.0).  Prior TAVR. Electronically Signed   By: Odessa FlemingH  Hall M.D.   On: 11/20/2018 14:54    EKG: Independently reviewed.  Sinus rhythm, LBBB, baseline wander.  No significant change since prior tracing from March 2020.  Assessment/Plan Principal Problem:   Acute respiratory failure with hypoxia (HCC) Active Problems:   HYPERTENSION, BENIGN   Pneumonia due to COVID-19 virus   Hyponatremia   Hypokalemia   Acute hypoxic respiratory failure secondary to COVID-19 viral pneumonia COVID-19 rapid test positive. SPO2 88% on room air with ambulation and placed on 2 L supplemental oxygen. Temperature 101.9 F.  No tachycardia or tachypnea.  No hypotension.  No leukocytosis.  Lactic acid normal x2. Procalcitonin <0.10.  LFTs normal.  Inflammatory markers including d-dimer, LDH, fibrinogen, and CRP elevated. Chest x-ray showing patchy indistinct opacity in the left midlung suspicious for acute infection such as bronchopneumonia, viral versus atypical etiology.   -IV Solu-Medrol 1 mg/kg/day divided in 2 doses -[facility]  Please start Remdesivir upon patient arrival.  In addition, patient will need Actemra given CRP >7.  Discussed with pharmacy and I am not able to start these medications [facility] due to lack of availability. -Do not give NSAIDs -Daily CBC with differential, CMP, CRP, d-dimer, ferritin, LDH -Airborne and contact precautions -Continuous pulse ox -Supplemental oxygen -Blood culture x2 pending -Airborne and contact precautions -Tylenol PRN fevers -Antitussives as needed  Mild hyponatremia Likely secondary to  decreased p.o. intake.  Sodium 133.   -Received 500 cc fluid bolus in the ED.  Encourage p.o. intake.  Avoid additional IV fluids given COVID-19 viral pneumonia.  Mild hypokalemia Likely secondary to decreased p.o. intake. Potassium 3.3. -Received potassium and magnesium supplementation in the ED.  Continue to monitor electrolytes.  Hypertension Systolic currently in 140s. -Continue home amlodipine and metoprolol.  CAD status post PCI -Stable.  High-sensitivity troponin negative x2.  EKG without acute ischemic changes.  Continue home aspirin, Lipitor, and metoprolol.  Hyperlipidemia -Continue Lipitor  Alcohol use -CIWA protocol; Ativan PRN -Thiamine, folate, multivitamin  Type 2 diabetes -Sliding scale insulin sensitive and CBG checks  DVT prophylaxis: Lovenox Code Status: Patient wishes to be DNR and DNI. Family Communication: No family available at this time. Disposition Plan: Anticipate discharge after clinical improvement. Consults called: None Admission status: It is my clinical opinion that referral for OBSERVATION is reasonable and necessary in this patient based on the above information provided. The aforementioned taken together are felt to place the patient at high risk for further clinical deterioration. However it is anticipated that the patient may be medically stable for discharge from the hospital within 24 to 48 hours.  The medical decision making on this patient was of high complexity and the patient is at high risk for clinical deterioration, therefore this is a level 3 visit.  John GiovanniVasundhra Krystall Kruckenberg Santiago [facility] Pager [phone]- [phone]  If 7PM-7AM, please contact night-coverage [email or web address] Password [email or web address]  11/21/2018, 6:39 AM

## 2018-11-20 NOTE — ED Provider Notes (Signed)
[facility] EMERGENCY DEPARTMENT Provider Note   CSN: [id number] Arrival date & time: 11/20/18  1341     History   Chief Complaint Chief Complaint  Patient presents with  . Chest Pain  . Fever    HPI Kara Santiago is a 85 y.o. female.     Patient with history of coronary artery disease, aortic insufficiency/stenosis status post TAVR --presents to the emergency department with complaint of central chest tightness, cough, shortness of breath, and fever for the past week.  Patient states that she has been progressively more weak and has had a decrease in appetite.  She reports loss of sense of smell and decreased appetite because foods are tasting badly.  No lower extremity swelling, skin rash.  No urinary symptoms.  No abdominal pain, vomiting, diarrhea.  Patient denies any known sick contacts including contacts who have tested positive for coronavirus.  Patient does live at home and is fairly independent.  She has a lot of family around as well.  She has stayed at home much of the time but does state that she goes shopping in public.  The onset of this condition was acute. The course is worsening. Aggravating factors: none. Alleviating factors: none.       Past Medical History:  Diagnosis Date  . Aortic insufficiency   . Arthritis    told that she has OA- pt. reports that she doesn't have any pain from it but she reports that she has back pain from time to time   . CAD (coronary artery disease)    a. 09/2014 Cath: patent LAD/LCX stents, otw nonobs dzs.  . Carotid artery occlusion   . CHF (congestive heart failure) ([place])   . CVD (cerebrovascular disease)   . Diabetes mellitus type 2, controlled ([place])   . GERD (gastroesophageal reflux disease)   . History of hiatal hernia   . Hyperlipidemia   . Hypertension   . Mitral regurgitation   . PVD (peripheral vascular disease) ([place])   . Severe aortic stenosis    a. 09/2014 Echo: EF 40%, gr2 DD, sev AS (mean 34  mmHg, peak 57 mmHg), mod MR, mild LAE, nl RV, mild TR, triv PI, PASP 41 mmHg;  b. 11/2014 TAVR: Oletta Lamas Sapien 3 THV (size 76mm) via L femoral cutdown;  c. 11/2014 postop Echo: EF 45-50%, no rwma, Gr 1 DD, minimally increased tranvsvalvular velocity, no AI/AS, mild MR, mildly dil LA, PASP 61mmHg.  Marland Kitchen Shortness of breath dyspnea     Patient Active Problem List   Diagnosis Date Noted  . Abnormal chest CT 07/07/2015  . Acute on chronic combined systolic and diastolic congestive heart failure ([place]) 11/28/2014  . Status post transcatheter aortic valve replacement (TAVR) using bioprosthesis 11/27/2014  . CAD (coronary artery disease)   . CVD (cerebrovascular disease)   . Carotid artery occlusion   . Severe aortic stenosis 11/26/2014  . Occlusion and stenosis of carotid artery without mention of cerebral infarction 01/26/2012  . URI (upper respiratory infection) 04/06/2011  . Type II diabetes mellitus ([place]) 08/05/2008  . CORONARY ATHEROSCLEROSIS NATIVE CORONARY ARTERY 08/05/2008  . PERIPHERAL VASCULAR DISEASE WITH CLAUDICATION 08/05/2008  . Hyperlipidemia 08/02/2008  . HYPERTENSION, BENIGN 08/02/2008  . Aortic valve disorder 08/02/2008  . CVA 08/02/2008  . Peripheral vascular disease ([place]) 08/02/2008    Past Surgical History:  Procedure Laterality Date  . ABDOMINAL HYSTERECTOMY     vaginal approach, partial   . CARDIAC CATHETERIZATION  04/07/07   By Dr.  Cooper -- 1. Severe LAD stenosis with successful PCI using a drug-eluting stent. 2. Severe left circumflex stenosis with successful PCI using a single  drug-eluting stent.  Marland Kitchen. CARDIAC CATHETERIZATION N/A 10/07/2014   Procedure: Right/Left Heart Cath and Coronary Angiography;  Surgeon: Tonny BollmanMichael Cooper, MD;  [facility];  Service: Cardiovascular;  Laterality: N/A;  . EYE SURGERY     bilateral cataracts removed & /w IOL  . stents  2008  . TEE WITHOUT CARDIOVERSION N/A 11/26/2014   Procedure: TRANSESOPHAGEAL ECHOCARDIOGRAM (TEE);   Surgeon: Tonny BollmanMichael Cooper, MD;  [facility];  Service: Open Heart Surgery;  Laterality: N/A;  . TRANSCATHETER AORTIC VALVE REPLACEMENT, TRANSFEMORAL N/A 11/26/2014   Procedure: TRANSCATHETER AORTIC VALVE REPLACEMENT, TRANSFEMORAL;  Surgeon: Tonny BollmanMichael Cooper, MD;  [facility];  Service: Open Heart Surgery;  Laterality: N/A;  . TYMPANOMASTOIDECTOMY  2002   right     OB History   No obstetric history on file.      Home Medications    Prior to Admission medications   Medication Sig Start Date End Date Taking? Authorizing Provider  acetaminophen (TYLENOL) 500 MG tablet Take 1,000 mg by mouth every 6 (six) hours as needed for mild pain or moderate pain.   Yes [provider]  amLODipine (NORVASC) 5 MG tablet TAKE 1 TABLET BY MOUTH EVERY DAY Patient taking differently: Take 5 mg by mouth daily.  06/22/18  Yes Lewayne Buntingrenshaw, Brian S, MD  aspirin 81 MG tablet Take 81 mg by mouth daily.     Yes [provider]  atorvastatin (LIPITOR) 80 MG tablet TAKE 1 TABLET BY MOUTH EVERY DAY Patient taking differently: Take 80 mg by mouth daily at 6 PM.  10/17/18  Yes Crenshaw, Madolyn FriezeBrian S, MD  glipiZIDE (GLUCOTROL) 5 MG tablet Take 5 mg by mouth daily.     Yes [provider]  guaiFENesin (MUCINEX) 600 MG 12 hr tablet Take 1,200 mg by mouth 2 (two) times daily as needed for cough or to loosen phlegm.   Yes [provider]  ibuprofen (ADVIL,MOTRIN) 200 MG tablet Take 200 mg by mouth every 6 (six) hours as needed for mild pain or moderate pain.   Yes [provider]  lisinopril (PRINIVIL,ZESTRIL) 40 MG tablet Take 1 tablet (40 mg total) by mouth daily. 08/01/18  Yes Lewayne Buntingrenshaw, Brian S, MD  metoprolol tartrate (LOPRESSOR) 25 MG tablet Take 1 tablet (25 mg total) by mouth 2 (two) times daily. Please keep upcoming appt for further refills. 06/21/18  Yes Tonny Bollmanooper, Michael, MD    Family History Family History  Problem Relation Age of Onset  . Heart attack Mother   . Heart disease  Mother        Before age 83  . Hyperlipidemia Mother   . Hypertension Mother   . Heart disease Sister        Before age 83  . Hyperlipidemia Sister   . Hypertension Sister   . Heart disease Brother        Before age 83  . Hyperlipidemia Brother   . Heart attack Brother   . Heart disease Brother        Before age 960-  Stents - CHF  . Pneumonia Other   . Heart disease Son [id number]       After age 83  . Heart attack Sister   . Heart attack Son   . Stroke Maternal Uncle     Social History Social History   Tobacco Use  .  Smoking status: Former Smoker    Packs/day: 0.40    Years: 5.00    Pack years: 2.00    Types: Cigarettes    Quit date: 05/24/2006    Years since quitting: 12.5  . Smokeless tobacco: Never Used  Substance Use Topics  . Alcohol use: Yes    Alcohol/week: 0.0 standard drinks    Comment: wine- Glass/ per day   . Drug use: No     Allergies   Contrast media [iodinated diagnostic agents]   Review of Systems Review of Systems  Constitutional: Positive for appetite change, chills, fatigue and fever.  HENT: Negative for rhinorrhea and sore throat.   Eyes: Negative for redness.  Respiratory: Positive for chest tightness and shortness of breath. Negative for apnea and cough.   Cardiovascular: Negative for chest pain.  Gastrointestinal: Negative for abdominal pain, diarrhea, nausea and vomiting.  Genitourinary: Negative for dysuria.  Musculoskeletal: Negative for myalgias.  Skin: Negative for rash.  Neurological: Positive for weakness (generalized). Negative for headaches.     Physical Exam Updated Vital Signs BP (!) 141/74 (BP Location: Right Arm)   Pulse 83   Temp 99.3 F (37.4 C) (Oral)   Resp 14   SpO2 94%   Physical Exam Vitals signs and nursing note reviewed.  Constitutional:      Appearance: She is well-developed.  HENT:     Head: Normocephalic and atraumatic.  Eyes:     General:        Right eye: No discharge.        Left eye: No discharge.      Conjunctiva/sclera: Conjunctivae normal.  Neck:     Musculoskeletal: Normal range of motion and neck supple.  Cardiovascular:     Rate and Rhythm: Normal rate and regular rhythm.     Heart sounds: Murmur present. Systolic murmur present.  Pulmonary:     Effort: Pulmonary effort is normal. Tachypnea present.     Breath sounds: Decreased breath sounds present.     Comments: Patient visibly short of breath and tachypneic with talking. Abdominal:     Palpations: Abdomen is soft.     Tenderness: There is no abdominal tenderness.  Skin:    General: Skin is warm and dry.  Neurological:     General: No focal deficit present.     Mental Status: She is alert.     Comments: Patient conversant and mentating normally.      ED Treatments / Results  Labs (all labs ordered are listed, but only abnormal results are displayed) Labs Reviewed  SARS CORONAVIRUS 2 (HOSPITAL ORDER, PERFORMED IN [facility]) - Abnormal; Notable for the following components:      Result Value   SARS Coronavirus 2 POSITIVE (*)    All other components within normal limits  BASIC METABOLIC PANEL - Abnormal; Notable for the following components:   Sodium 133 (*)    Potassium 3.3 (*)    Glucose, Bld 105 (*)    Calcium 8.5 (*)    All other components within normal limits  D-DIMER, QUANTITATIVE (NOT AT [facility]) - Abnormal; Notable for the following components:   D-Dimer, Quant 1.26 (*)    All other components within normal limits  LACTATE DEHYDROGENASE - Abnormal; Notable for the following components:   LDH 293 (*)    All other components within normal limits  FIBRINOGEN - Abnormal; Notable for the following components:   Fibrinogen 648 (*)    All other components within normal limits  C-REACTIVE PROTEIN - Abnormal; Notable for the following components:   CRP 10.7 (*)    All other components within normal limits  HEPATIC FUNCTION PANEL - Abnormal; Notable for the following components:   Albumin 3.0  (*)    All other components within normal limits  CULTURE, BLOOD (ROUTINE X 2)  CULTURE, BLOOD (ROUTINE X 2)  CBC  TROPONIN I (HIGH SENSITIVITY)  LACTIC ACID, PLASMA  LACTIC ACID, PLASMA  PROCALCITONIN  FERRITIN  TRIGLYCERIDES  TROPONIN I (HIGH SENSITIVITY)    EKG EKG Interpretation  Date/Time:  Monday November 20 2018 13:55:07 EDT Ventricular Rate:  85 PR Interval:  140 QRS Duration: 138 QT Interval:  410 QTC Calculation: 487 R Axis:   -67 Text Interpretation:  Normal sinus rhythm Left axis deviation Non-specific intra-ventricular conduction block T wave abnormality, consider lateral ischemia Abnormal ECG similar to prior 6/16 Confirmed by Meridee Score [phone]) on 11/20/2018 3:06:31 PM   Radiology Dg Chest 2 View  Result Date: 11/20/2018 CLINICAL DATA:  85 year old female with chest pain and shortness of breath EXAM: CHEST - 2 VIEW COMPARISON:  Chest CT 07/14/2015 and earlier. FINDINGS: Upright AP and lateral views. Sequelae of TAVR redemonstrated. Low lung volumes today. Asymmetric and indistinct opacity in the left mid lung (arrow). Pulmonary vascularity appears stable and within normal limits. No pneumothorax, pleural effusion, or other confluent pulmonary opacity. Visualized tracheal air column is within normal limits. Chronic spinal compression fractures. Stable visualized osseous structures. Calcified aortic atherosclerosis. Negative visible bowel gas pattern. IMPRESSION: 1. Lower lung volumes with patchy indistinct opacity in the left mid lung. This is nonspecific but suspicious for acute infection such as bronchopneumonia. Consider viral/atypical etiologies. No pleural effusion. 2.  Aortic Atherosclerosis (ICD10-I70.0).  Prior TAVR. Electronically Signed   By: Odessa Fleming M.D.   On: 11/20/2018 14:54    Procedures Procedures (including critical care time)  Medications Ordered in ED Medications  sodium chloride flush (NS) 0.9 % injection 3 mL (has no administration in time  range)  acetaminophen (TYLENOL) tablet 650 mg (has no administration in time range)  sodium chloride 0.9 % bolus 500 mL (500 mLs Intravenous New Bag/Given 11/20/18 1905)  potassium chloride SA (K-DUR) CR tablet 40 mEq (40 mEq Oral Given 11/20/18 1843)  magnesium oxide (MAG-OX) tablet 200 mg (200 mg Oral Given 11/20/18 1845)     Initial Impression / Assessment and Plan / ED Course  I have reviewed the triage vital signs and the nursing notes.  Pertinent labs & imaging results that were available during my care of the patient were reviewed by me and considered in my medical decision making (see chart for details).  Clinical Course as of Nov 19 1956  Mon Nov 20, 2018  5056 85 year old very hard of hearing here with fever chest pain shortness of breath.  Chest x-ray showing concern for pneumonia.  EKG does not show an obvious STEMI.  She is getting labs troponin Covid testing blood cultures.   [MB]    Clinical Course User Index [MB] Terrilee Files, MD       Patient seen and examined.  She is symptomatic to the point where she will need to be admitted for coronavirus or pneumonia.  Awaiting remainder of work-up for delineation.  She is stable at the current time.  Chest x-ray personally reviewed.  EKG is unchanged from previous.  Vital signs reviewed and are as follows: BP (!) 141/74 (BP Location: Right Arm)   Pulse 83   Temp 99.3 F (  37.4 C) (Oral)   Resp 14   SpO2 94%   + COVID. Labs consistent.   I spoke with hospitalist. Pt doesn't really meet criteria for admission. Asked that I give fluids, K, Mg, reassess.   8:00 PM Patient monitored in ED. NT attempted ambulation -- states that she dropped to 88% pretty quickly off O2 (no O2 at home) and was 91-92% on 2L. Pt was very weak, nauseous, unable to ambulate without assistance. Will discuss with hospitalist.   8:46 PM Spoke with Dr. Loney Lohathore who will see.   Kara Santiago was evaluated in Emergency Department on 11/20/2018 for  the symptoms described in the history of present illness. She was evaluated in the context of the global COVID-19 pandemic, which necessitated consideration that the patient might be at risk for infection with the SARS-CoV-2 virus that causes COVID-19. Institutional protocols and algorithms that pertain to the evaluation of patients at risk for COVID-19 are in a state of rapid change based on information released by regulatory bodies including the CDC and federal and state organizations. These policies and algorithms were followed during the patient's care in the ED.    Final Clinical Impressions(s) / ED Diagnoses   Final diagnoses:  Pneumonia due to COVID-19 virus  Hypoxia   COVID-19 pneumonia with associated shortness of breath, tachypnea, and hypoxia.  ED Discharge Orders    None       Renne CriglerGeiple, Keslee Harrington, Cordelia Poche-C 11/20/18 2046    Terrilee FilesButler, Michael C, MD 11/21/18 [phone]

## 2018-11-20 NOTE — ED Notes (Signed)
ED TO INPATIENT HANDOFF REPORT  ED Nurse Name and Phone #: Tray Swazil[place]Jordan, [phone]  S Name/Age/Gender Kara Santiago 85 y.o. female Room/Bed: 030C/030C  Code Status   Code Status: Prior  Home/SNF/Other Home Patient oriented to: self, place, time and situation Is this baseline? Yes   Triage Complete: Triage complete  Chief Complaint cp,fever  Triage Note Patient reports central, non-radiating chest pain, shortness of breath, cough, and fever x 1 week. At rest, resp e/u, but slightly labored with minimal talking. Also endorses generalized weakness. No known sick contacts.    Allergies Allergies  Allergen Reactions  . Contrast Media [Iodinated Diagnostic Agents] Rash    Blister's    Level of Care/Admitting Diagnosis ED Disposition    ED Disposition Condition Comment   Admit  [facility] [[id number]]  Level of Care: Telemetry [5]  Covid Evaluation: Confirmed COVID Positive  Isolation Risk Level: Low Risk/Droplet (Less than 4L [place] supplementation)  Diagnosis: Acute respiratory failure with hypoxia [facility](HCC) [[id number]]) [[id number]]  Admitting Physician: John GiovanniATHORE, VASUNDHRA [[id number]]  Attending Physician: John GiovanniATHORE, VASUNDHRA [[id number]]  PT Class (Do Not Modify): Observation [104]  PT Acc Code (Do Not Modify): Observation [10022]       B Medical/Surgery History Past Medical History:  Diagnosis Date  . Aortic insufficiency   . Arthritis    told that she has OA- pt. reports that she doesn't have any pain from it but she reports that she has back pain from time to time   . CAD (coronary artery disease)    a. 09/2014 Cath: patent LAD/LCX stents, otw nonobs dzs.  . Carotid artery occlusion   . CHF (congestive heart failure) (HCC)   . CVD (cerebrovascular disease)   . Diabetes mellitus type 2, controlled (HCC)   . GERD (gastroesophageal reflux disease)   . History of hiatal hernia   . Hyperlipidemia   . Hypertension   . Mitral regurgitation   . PVD  (peripheral vascular disease) (HCC)   . Severe aortic stenosis    a. 09/2014 Echo: EF 40%, gr2 DD, sev AS (mean 34 mmHg, peak 57 mmHg), mod MR, mild LAE, nl RV, mild TR, triv PI, PASP 41 mmHg;  b. 11/2014 TAVR: Randa EvensEdwards Sapien 3 THV (size 23mm) via L femoral cutdown;  c. 11/2014 postop Echo: EF 45-50%, no rwma, Gr 1 DD, minimally increased tranvsvalvular velocity, no AI/AS, mild MR, mildly dil LA, PASP 41mmHg.  Marland Kitchen. Shortness of breath dyspnea    Past Surgical History:  Procedure Laterality Date  . ABDOMINAL HYSTERECTOMY     vaginal approach, partial   . CARDIAC CATHETERIZATION  04/07/07   By Dr. Excell Seltzerooper -- 1. Severe LAD stenosis with successful PCI using a drug-eluting stent. 2. Severe left circumflex stenosis with successful PCI using a single  drug-eluting stent.  Marland Kitchen. CARDIAC CATHETERIZATION N/A 10/07/2014   Procedure: Right/Left Heart Cath and Coronary Angiography;  Surgeon: Tonny BollmanMichael Cooper, MD;  [facility];  Service: Cardiovascular;  Laterality: N/A;  . EYE SURGERY     bilateral cataracts removed & /w IOL  . stents  2008  . TEE WITHOUT CARDIOVERSION N/A 11/26/2014   Procedure: TRANSESOPHAGEAL ECHOCARDIOGRAM (TEE);  Surgeon: Tonny BollmanMichael Cooper, MD;  [facility];  Service: Open Heart Surgery;  Laterality: N/A;  . TRANSCATHETER AORTIC VALVE REPLACEMENT, TRANSFEMORAL N/A 11/26/2014   Procedure: TRANSCATHETER AORTIC VALVE REPLACEMENT, TRANSFEMORAL;  Surgeon: Tonny BollmanMichael Cooper, MD;  [facility];  Service: Open Heart Surgery;  Laterality: N/A;  . TYMPANOMASTOIDECTOMY  2002   right     A IV Location/Drains/Wounds Patient Lines/Drains/Airways Status   Active Line/Drains/Airways    Name:   Placement date:   Placement time:   Site:   Days:   Peripheral IV 11/20/18 Right Wrist   11/20/18    1653    Wrist   less than 1   Peripheral IV 11/20/18 Right Forearm   11/20/18    1802    Forearm   less than 1   Incision (Closed) 11/26/14 Groin Left   11/26/14    1600     1455   Incision (Closed)  11/26/14 Groin Right   11/26/14    -     1455          Intake/Output Last 24 hours No intake or output data in the 24 hours ending 11/20/18 2101  Labs/Imaging Results for orders placed or performed during the hospital encounter of 11/20/18 (from the past 48 hour(s))  Basic metabolic panel     Status: Abnormal   Collection Time: 11/20/18  1:59 PM  Result Value Ref Range   Sodium 133 (L) 135 - 145 mmol/L   Potassium 3.3 (L) 3.5 - 5.1 mmol/L   Chloride 98 98 - 111 mmol/L   CO2 24 22 - 32 mmol/L   Glucose, Bld 105 (H) 70 - 99 mg/dL   BUN 11 8 - 23 mg/dL   Creatinine, Ser 1.610.73 0.44 - 1.00 mg/dL   Calcium 8.5 (L) 8.9 - 10.3 mg/dL   GFR calc non Af Amer >60 >60 mL/min   GFR calc Af Amer >60 >60 mL/min   Anion gap 11 5 - 15    Comment: Performed at [facility], [address] [place], [place] [address]  CBC     Status: None   Collection Time: 11/20/18  1:59 PM  Result Value Ref Range   WBC 4.9 4.0 - 10.5 K/uL   RBC 3.95 3.87 - 5.11 MIL/uL   Hemoglobin 12.5 12.0 - 15.0 g/dL   HCT 54.036.5 98.136.0 - 19.146.0 %   MCV 92.4 80.0 - 100.0 fL   MCH 31.6 26.0 - 34.0 pg   MCHC 34.2 30.0 - 36.0 g/dL   RDW 47.814.1 29.511.5 - 62.115.5 %   Platelets 222 150 - 400 K/uL   nRBC 0.0 0.0 - 0.2 %    Comment: Performed at [facility], [address] [place], [place] [address]  Troponin I (High Sensitivity)     Status: None   Collection Time: 11/20/18  1:59 PM  Result Value Ref Range   Troponin I (High Sensitivity) 9 <18 ng/L    Comment: (NOTE) Elevated high sensitivity troponin I (hsTnI) values and significant  changes across serial measurements may suggest ACS but many other  chronic and acute conditions are known to elevate hsTnI results.  Refer to the "Links" section for chest pain algorithms and additional  guidance. Performed at [facility], [address] [place], [place] [address]   Lactic acid, plasma     Status: None   Collection Time: 11/20/18  3:24 PM  Result Value Ref Range    Lactic Acid, Venous 1.0 0.5 - 1.9 mmol/L    Comment: Performed at [facility], [address] [place], [place] [address]  SARS Coronavirus 2 (CEPHEID- Performed in [facility] hospital lab), Hosp Order     Status: Abnormal   Collection Time: 11/20/18  3:33 PM   Specimen: Nasopharyngeal Swab  Result Value Ref Range   SARS Coronavirus 2 POSITIVE (A) NEGATIVE    Comment: RESULT CALLED TO, READ BACK BY AND VERIFIED WITH: Kandace Parkins RN 17:15 11/20/18 ([email or web address]) (NOTE) If result is NEGATIVE SARS-CoV-2 target nucleic acids are NOT DETECTED. The SARS-CoV-2 RNA is generally detectable in upper and lower  respiratory specimens during the acute phase of infection. The lowest  concentration of SARS-CoV-2 viral copies this assay can detect is 250  copies / mL. A negative result does not preclude SARS-CoV-2 infection  and should not be used as the sole basis for treatment or other  patient management decisions.  A negative result may occur with  improper specimen collection / handling, submission of specimen other  than nasopharyngeal swab, presence of viral mutation(s) within the  areas targeted by this assay, and inadequate number of viral copies  (<250 copies / mL). A negative result must be combined with clinical  observations, patient history, and epidemiological information. If result is POSITIVE SARS-CoV-2 target nucleic acids are DETECTED. T he SARS-CoV-2 RNA is generally detectable in upper and lower  respiratory specimens during the acute phase of infection.  Positive  results are indicative of active infection with SARS-CoV-2.  Clinical  correlation with patient history and other diagnostic information is  necessary to determine patient infection status.  Positive results do  not rule out bacterial infection or co-infection with other viruses. If result is PRESUMPTIVE POSTIVE SARS-CoV-2 nucleic acids MAY BE PRESENT.   A presumptive positive result was obtained on the submitted  specimen  and confirmed on repeat testing.  While 2019 novel coronavirus  (SARS-CoV-2) nucleic acids may be present in the submitted sample  additional confirmatory testing may be necessary for epidemiological  and / or clinical management purposes  to differentiate between  SARS-CoV-2 and other Sarbecovirus currently known to infect humans.  If clinically indicated additional testing with an alternate test  methodology [phone]) is  advised. The SARS-CoV-2 RNA is generally  detectable in upper and lower respiratory specimens during the acute  phase of infection. The expected result is Negative. Fact Sheet for Patients:  StrictlyIdeas.no Fact Sheet for Healthcare Providers: BankingDealers.co.za This test is not yet approved or cleared by the [place] FDA and has been authorized for detection and/or diagnosis of SARS-CoV-2 by FDA under an Emergency Use Authorization (EUA).  This EUA will remain in effect (meaning this test can be used) for the duration of the COVID-19 declaration under Section 564(b)(1) of the Act, 21 U.S.C. section 360bbb-3(b)(1), unless the authorization is terminated or revoked sooner. Performed at [facility], [place] [address], [place], [place] [address]   D-dimer, quantitative     Status: Abnormal   Collection Time: 11/20/18  3:55 PM  Result Value Ref Range   D-Dimer, Quant 1.26 (H) 0.00 - 0.50 ug/mL-FEU    Comment: (NOTE) At the manufacturer cut-off of 0.50 ug/mL FEU, this assay has been documented to exclude PE with a sensitivity and negative predictive value of 97 to 99%.  At this time, this assay has not been approved by the FDA to exclude DVT/VTE. Results should be correlated with clinical presentation. Performed at [facility], [place] [address] [place], [place] [address]   Procalcitonin     Status: None   Collection Time: 11/20/18  3:55 PM  Result Value Ref Range   Procalcitonin <0.10  ng/mL    Comment:        Interpretation: PCT (Procalcitonin) <= 0.5 ng/mL: Systemic infection (sepsis)  is not likely. Local bacterial infection is possible. (NOTE)       Sepsis PCT Algorithm           Lower Respiratory Tract                                      Infection PCT Algorithm    ----------------------------     ----------------------------         PCT < 0.25 ng/mL                PCT < 0.10 ng/mL         Strongly encourage             Strongly discourage   discontinuation of antibiotics    initiation of antibiotics    ----------------------------     -----------------------------       PCT 0.25 - 0.50 ng/mL            PCT 0.10 - 0.25 ng/mL               OR       >80% decrease in PCT            Discourage initiation of                                            antibiotics      Encourage discontinuation           of antibiotics    ----------------------------     -----------------------------         PCT >= 0.50 ng/mL              PCT 0.26 - 0.50 ng/mL               AND        <80% decrease in PCT             Encourage initiation of                                             antibiotics       Encourage continuation           of antibiotics    ----------------------------     -----------------------------        PCT >= 0.50 ng/mL                  PCT > 0.50 ng/mL               AND         increase in PCT                  Strongly encourage                                      initiation of antibiotics    Strongly encourage escalation           of antibiotics                                     -----------------------------  PCT <= 0.25 ng/mL                                                 OR                                        > 80% decrease in PCT                                     Discontinue / Do not initiate                                             antibiotics Performed at [facility], [address],  [place], [place] [address]   Lactate dehydrogenase     Status: Abnormal   Collection Time: 11/20/18  3:55 PM  Result Value Ref Range   LDH 293 (H) 98 - 192 U/L    Comment: Performed at [facility], [address] [place], [place] [address]  Ferritin     Status: None   Collection Time: 11/20/18  3:55 PM  Result Value Ref Range   Ferritin 301 11 - 307 ng/mL    Comment: Performed at [facility], [address] [place], [place] [address]  Fibrinogen     Status: Abnormal   Collection Time: 11/20/18  3:55 PM  Result Value Ref Range   Fibrinogen 648 (H) 210 - 475 mg/dL    Comment: Performed at [facility], [address] [place], [place] [address]  C-reactive protein     Status: Abnormal   Collection Time: 11/20/18  3:55 PM  Result Value Ref Range   CRP 10.7 (H) <1.0 mg/dL    Comment: Performed at [facility], [address], [place], [place] [address]  Hepatic function panel     Status: Abnormal   Collection Time: 11/20/18  3:55 PM  Result Value Ref Range   Total Protein 7.0 6.5 - 8.1 g/dL   Albumin 3.0 (L) 3.5 - 5.0 g/dL   AST 36 15 - 41 U/L   ALT 22 0 - 44 U/L   Alkaline Phosphatase 70 38 - 126 U/L   Total Bilirubin 0.7 0.3 - 1.2 mg/dL   Bilirubin, Direct 0.2 0.0 - 0.2 mg/dL   Indirect Bilirubin 0.5 0.3 - 0.9 mg/dL    Comment: Performed at [facility], [address], [place], [place] [address]  Triglycerides     Status: None   Collection Time: 11/20/18  3:55 PM  Result Value Ref Range   Triglycerides 100 <150 mg/dL    Comment: Performed at [facility], [address], [place], [place] [address]  Troponin I (High Sensitivity)     Status: None   Collection Time: 11/20/18  3:55 PM  Result Value Ref Range   Troponin I (High Sensitivity) 9 <18 ng/L    Comment: (NOTE) Elevated high sensitivity troponin I (hsTnI) values and significant  changes across serial measurements may suggest ACS but many other  chronic and  acute conditions are known  to elevate hsTnI results.  Refer to the "Links" section for chest pain algorithms and additional  guidance. Performed at [facility], [address], [place], [place] [address]   Lactic acid, plasma     Status: None   Collection Time: 11/20/18  6:54 PM  Result Value Ref Range   Lactic Acid, Venous 0.8 0.5 - 1.9 mmol/L    Comment: Performed at [facility], [address], [place], [place] [address]   Dg Chest 2 View  Result Date: 11/20/2018 CLINICAL DATA:  85 year old female with chest pain and shortness of breath EXAM: CHEST - 2 VIEW COMPARISON:  Chest CT 07/14/2015 and earlier. FINDINGS: Upright AP and lateral views. Sequelae of TAVR redemonstrated. Low lung volumes today. Asymmetric and indistinct opacity in the left mid lung (arrow). Pulmonary vascularity appears stable and within normal limits. No pneumothorax, pleural effusion, or other confluent pulmonary opacity. Visualized tracheal air column is within normal limits. Chronic spinal compression fractures. Stable visualized osseous structures. Calcified aortic atherosclerosis. Negative visible bowel gas pattern. IMPRESSION: 1. Lower lung volumes with patchy indistinct opacity in the left mid lung. This is nonspecific but suspicious for acute infection such as bronchopneumonia. Consider viral/atypical etiologies. No pleural effusion. 2.  Aortic Atherosclerosis (ICD10-I70.0).  Prior TAVR. Electronically Signed   By: Odessa Fleming M.D.   On: 11/20/2018 14:54    Pending Labs Unresulted Labs (From admission, onward)    Start     Ordered   11/20/18 1524  Blood Culture (routine x 2)  BLOOD CULTURE X 2,   STAT     11/20/18 1526          Vitals/Pain Today's Vitals   11/20/18 1800 11/20/18 1815 11/20/18 1830 11/20/18 1859  BP: (!) 154/67 (!) 176/60 (!) 164/63   Pulse: 78 79 78   Resp:      Temp:    (!) 101.9 F (38.8 C)  TempSrc:    Oral  SpO2: 99% 97% 97%   PainSc:        Isolation Precautions No active  isolations  Medications Medications  sodium chloride flush (NS) 0.9 % injection 3 mL (has no administration in time range)  sodium chloride 0.9 % bolus 500 mL (0 mLs Intravenous Stopped 11/20/18 2033)  potassium chloride SA (K-DUR) CR tablet 40 mEq (40 mEq Oral Given 11/20/18 1843)  magnesium oxide (MAG-OX) tablet 200 mg (200 mg Oral Given 11/20/18 1845)  acetaminophen (TYLENOL) tablet 650 mg (650 mg Oral Given 11/20/18 2026)    Mobility walks with device Moderate fall risk   Focused Assessments Pulmonary Assessment Handoff:  Lung sounds: Bilateral Breath Sounds: Diminished L Breath Sounds: Diminished R Breath Sounds: Diminished O2 Device: Nasal Cannula O2 Flow Rate (L/min): 2 L/min      R Recommendations: See Admitting Provider Note  Report given to:   Additional Notes: pt on 2L via [place]

## 2018-11-20 NOTE — ED Triage Notes (Signed)
Patient reports central, non-radiating chest pain, shortness of breath, cough, and fever x 1 week. At rest, resp e/u, but slightly labored with minimal talking. Also endorses generalized weakness. No known sick contacts.

## 2018-11-20 NOTE — ED Notes (Signed)
Pt ambulated with pulse ox of 91-92% on 2L  x1 person assist. Pt stated she felt dizzy and nauseous.

## 2018-11-20 NOTE — ED Notes (Signed)
Patient transported to X-ray 

## 2018-11-21 ENCOUNTER — Other Ambulatory Visit: Payer: Self-pay

## 2018-11-21 DIAGNOSIS — I5032 Chronic diastolic (congestive) heart failure: Secondary | ICD-10-CM | POA: Diagnosis present

## 2018-11-21 DIAGNOSIS — Z7982 Long term (current) use of aspirin: Secondary | ICD-10-CM | POA: Diagnosis not present

## 2018-11-21 DIAGNOSIS — I10 Essential (primary) hypertension: Secondary | ICD-10-CM

## 2018-11-21 DIAGNOSIS — Z91041 Radiographic dye allergy status: Secondary | ICD-10-CM | POA: Diagnosis not present

## 2018-11-21 DIAGNOSIS — U071 COVID-19: Principal | ICD-10-CM

## 2018-11-21 DIAGNOSIS — Z823 Family history of stroke: Secondary | ICD-10-CM | POA: Diagnosis not present

## 2018-11-21 DIAGNOSIS — J1289 Other viral pneumonia: Secondary | ICD-10-CM | POA: Diagnosis not present

## 2018-11-21 DIAGNOSIS — E871 Hypo-osmolality and hyponatremia: Secondary | ICD-10-CM | POA: Diagnosis not present

## 2018-11-21 DIAGNOSIS — E876 Hypokalemia: Secondary | ICD-10-CM | POA: Diagnosis not present

## 2018-11-21 DIAGNOSIS — I2510 Atherosclerotic heart disease of native coronary artery without angina pectoris: Secondary | ICD-10-CM | POA: Diagnosis present

## 2018-11-21 DIAGNOSIS — Z8249 Family history of ischemic heart disease and other diseases of the circulatory system: Secondary | ICD-10-CM | POA: Diagnosis not present

## 2018-11-21 DIAGNOSIS — E785 Hyperlipidemia, unspecified: Secondary | ICD-10-CM | POA: Diagnosis not present

## 2018-11-21 DIAGNOSIS — Z952 Presence of prosthetic heart valve: Secondary | ICD-10-CM | POA: Diagnosis not present

## 2018-11-21 DIAGNOSIS — Z8673 Personal history of transient ischemic attack (TIA), and cerebral infarction without residual deficits: Secondary | ICD-10-CM | POA: Diagnosis not present

## 2018-11-21 DIAGNOSIS — Z66 Do not resuscitate: Secondary | ICD-10-CM | POA: Diagnosis not present

## 2018-11-21 DIAGNOSIS — J069 Acute upper respiratory infection, unspecified: Secondary | ICD-10-CM

## 2018-11-21 DIAGNOSIS — I110 Hypertensive heart disease with heart failure: Secondary | ICD-10-CM | POA: Diagnosis present

## 2018-11-21 DIAGNOSIS — Z7984 Long term (current) use of oral hypoglycemic drugs: Secondary | ICD-10-CM | POA: Diagnosis not present

## 2018-11-21 DIAGNOSIS — Z955 Presence of coronary angioplasty implant and graft: Secondary | ICD-10-CM | POA: Diagnosis not present

## 2018-11-21 DIAGNOSIS — Z79899 Other long term (current) drug therapy: Secondary | ICD-10-CM | POA: Diagnosis not present

## 2018-11-21 DIAGNOSIS — K219 Gastro-esophageal reflux disease without esophagitis: Secondary | ICD-10-CM | POA: Diagnosis present

## 2018-11-21 DIAGNOSIS — I080 Rheumatic disorders of both mitral and aortic valves: Secondary | ICD-10-CM | POA: Diagnosis present

## 2018-11-21 DIAGNOSIS — M1990 Unspecified osteoarthritis, unspecified site: Secondary | ICD-10-CM | POA: Diagnosis present

## 2018-11-21 DIAGNOSIS — E1151 Type 2 diabetes mellitus with diabetic peripheral angiopathy without gangrene: Secondary | ICD-10-CM | POA: Diagnosis not present

## 2018-11-21 DIAGNOSIS — Z8349 Family history of other endocrine, nutritional and metabolic diseases: Secondary | ICD-10-CM | POA: Diagnosis not present

## 2018-11-21 DIAGNOSIS — J9601 Acute respiratory failure with hypoxia: Secondary | ICD-10-CM | POA: Diagnosis not present

## 2018-11-21 LAB — COMPREHENSIVE METABOLIC PANEL
ALT: 21 U/L (ref 0–44)
AST: 33 U/L (ref 15–41)
Albumin: 3.1 g/dL — ABNORMAL LOW (ref 3.5–5.0)
Alkaline Phosphatase: 71 U/L (ref 38–126)
Anion gap: 13 (ref 5–15)
BUN: 10 mg/dL (ref 8–23)
CO2: 23 mmol/L (ref 22–32)
Calcium: 8.5 mg/dL — ABNORMAL LOW (ref 8.9–10.3)
Chloride: 98 mmol/L (ref 98–111)
Creatinine, Ser: 0.55 mg/dL (ref 0.44–1.00)
GFR calc Af Amer: >60 mL/min (ref >60)
GFR calc non Af Amer: >60 mL/min (ref >60)
Glucose, Bld: 154 mg/dL — ABNORMAL HIGH (ref 70–99)
Potassium: 3.8 mmol/L (ref 3.5–5.1)
Sodium: 134 mmol/L — ABNORMAL LOW (ref 135–145)
Total Bilirubin: 0.4 mg/dL (ref 0.3–1.2)
Total Protein: 7 g/dL (ref 6.5–8.1)

## 2018-11-21 LAB — CBC WITH DIFFERENTIAL/PLATELET
Abs Immature Granulocytes: 0.03 10*3/uL (ref 0.00–0.07)
Basophils Absolute: 0 10*3/uL (ref 0.0–0.1)
Basophils Relative: 0 %
Eosinophils Absolute: 0.1 10*3/uL (ref 0.0–0.5)
Eosinophils Relative: 2 %
HCT: 37.5 % (ref 36.0–46.0)
Hemoglobin: 13 g/dL (ref 12.0–15.0)
Immature Granulocytes: 1 %
Lymphocytes Relative: 7 %
Lymphs Abs: 0.4 10*3/uL — ABNORMAL LOW (ref 0.7–4.0)
MCH: 32.2 pg (ref 26.0–34.0)
MCHC: 34.7 g/dL (ref 30.0–36.0)
MCV: 92.8 fL (ref 80.0–100.0)
Monocytes Absolute: 0.1 10*3/uL (ref 0.1–1.0)
Monocytes Relative: 2 %
Neutro Abs: 5.2 10*3/uL (ref 1.7–7.7)
Neutrophils Relative %: 88 %
Platelets: 260 10*3/uL (ref 150–400)
RBC: 4.04 MIL/uL (ref 3.87–5.11)
RDW: 14.3 % (ref 11.5–15.5)
WBC: 6 10*3/uL (ref 4.0–10.5)
nRBC: 0 % (ref 0.0–0.2)

## 2018-11-21 LAB — GLUCOSE, CAPILLARY
Glucose-Capillary: 103 mg/dL — ABNORMAL HIGH (ref 70–99)
Glucose-Capillary: 109 mg/dL — ABNORMAL HIGH (ref 70–99)
Glucose-Capillary: 154 mg/dL — ABNORMAL HIGH (ref 70–99)
Glucose-Capillary: 207 mg/dL — ABNORMAL HIGH (ref 70–99)
Glucose-Capillary: 146 mg/dL — ABNORMAL HIGH (ref 70–99)

## 2018-11-21 LAB — C-REACTIVE PROTEIN: CRP: 16.1 mg/dL — ABNORMAL HIGH (ref <1.0)

## 2018-11-21 LAB — FERRITIN: Ferritin: 348 ng/mL — ABNORMAL HIGH (ref 11–307)

## 2018-11-21 LAB — D-DIMER, QUANTITATIVE: D-Dimer, Quant: 1.39 ug/mL-FEU — ABNORMAL HIGH (ref 0.00–0.50)

## 2018-11-21 LAB — LACTATE DEHYDROGENASE: LDH: 274 U/L — ABNORMAL HIGH (ref 98–192)

## 2018-11-21 MED ORDER — INSULIN ASPART 100 UNIT/ML ~~LOC~~ SOLN
0.0000 [IU] | Freq: Three times a day (TID) | SUBCUTANEOUS | Status: DC
  Administered 2018-11-21: 3 [IU] via SUBCUTANEOUS
  Administered 2018-11-21 – 2018-11-22 (×3): 2 [IU] via SUBCUTANEOUS
  Administered 2018-11-22: 09:00:00 1 [IU] via SUBCUTANEOUS
  Administered 2018-11-23: 13:00:00 2 [IU] via SUBCUTANEOUS
  Administered 2018-11-23: 19:00:00 3 [IU] via SUBCUTANEOUS
  Administered 2018-11-23: 09:00:00 1 [IU] via SUBCUTANEOUS
  Administered 2018-11-24: 17:00:00 2 [IU] via SUBCUTANEOUS

## 2018-11-21 MED ORDER — METHYLPREDNISOLONE SODIUM SUCC 40 MG IJ SOLR
40.0000 mg | Freq: Two times a day (BID) | INTRAMUSCULAR | Status: DC
  Administered 2018-11-21: 40 mg via INTRAVENOUS
  Filled 2018-11-21: qty 1

## 2018-11-21 MED ORDER — METHYLPREDNISOLONE SODIUM SUCC 125 MG IJ SOLR
60.0000 mg | Freq: Every day | INTRAMUSCULAR | Status: DC
  Administered 2018-11-22 – 2018-11-24 (×3): 60 mg via INTRAVENOUS
  Filled 2018-11-21 (×3): qty 2

## 2018-11-21 MED ORDER — METHYLPREDNISOLONE SODIUM SUCC 40 MG IJ SOLR
20.0000 mg | Freq: Once | INTRAMUSCULAR | Status: AC
  Administered 2018-11-21: 20 mg via INTRAVENOUS
  Filled 2018-11-21: qty 1

## 2018-11-21 MED ORDER — ENOXAPARIN SODIUM 30 MG/0.3ML ~~LOC~~ SOLN
30.0000 mg | SUBCUTANEOUS | Status: DC
  Administered 2018-11-22 – 2018-11-24 (×3): 30 mg via SUBCUTANEOUS
  Filled 2018-11-21 (×4): qty 0.3

## 2018-11-21 NOTE — Evaluation (Signed)
Physical Therapy Evaluation Patient Details Name: Kara BrunnerBeatrice S Santiago MRN: [id number] DOB: 1925-08-28 Today's Date: 11/21/2018   History of Present Illness  85 y.o. female admitted on 11/20/18 for SOB, cough, fever.  Dx with acute respiratory disease due to COVID 19 (+) PNA, hypokalemia, hyponatremia.  Pt with other significant PMH of severe aortic stenosis, mitral regurgitation, HTN, DM, CVD, CHF, CAD, s/p TAVR.    Clinical Impression  This is a pretty remarkable, independent 85 y.o. lady who lives alone, drives, does some light yard work and housework and cooks for her family once a week.  She likes to go to the beach and does not use an assistive device at baseline.  Her O2 sats did drop to 85% with 3/4 DOE while walking to the bathroom and back to the chair.  She rebounded in <5 mins with 2 L O2 [place] applied and once she was in the 90s, I removed as she was 90% at rest on RA when I entered her room.  She is mildly unsteady on her feet, but I think she will respond well to acute and home therapy.   PT to follow acutely for deficits listed below.      Follow Up Recommendations Home health PT;Supervision - Intermittent    Equipment Recommendations  None recommended by PT    Recommendations for Other Services   NA     Precautions / Restrictions Precautions Precautions: Other (comment) Precaution Comments: monitor O2 sats      Mobility  Bed Mobility Overal bed mobility: Modified Independent                Transfers Overall transfer level: Needs assistance Equipment used: 1 person hand held assist Transfers: Sit to/from Stand Sit to Stand: Min guard         General transfer comment: min guard assist for safety, balance, and line management.   Ambulation/Gait Ambulation/Gait assistance: Min guard Gait Distance (Feet): 25 Feet Assistive device: 1 person hand held assist;None Gait Pattern/deviations: Step-through pattern;Staggering left;Staggering right     General Gait  Details: mildly staggering gait pattern requiring min guard assist for safety.          Balance Overall balance assessment: Needs assistance Sitting-balance support: Feet supported;No upper extremity supported Sitting balance-Leahy Scale: Good     Standing balance support: Bilateral upper extremity supported;No upper extremity supported;Single extremity supported Standing balance-Leahy Scale: Fair Standing balance comment: close supervision in static standing.                              Pertinent Vitals/Pain Pain Assessment: No/denies pain    Home Living Family/patient expects to be discharged to:: Private residence Living Arrangements: Alone Available Help at Discharge: Family;Available PRN/intermittently Type of Home: House Home Access: Stairs to enter     Home Layout: One level;Laundry or work area in [employer]basement Home Equipment: None      Prior Function Level of Independence: Independent         Comments: drives, cooks for family once a week, does some light yard work.      Hand Dominance   Dominant Hand: Right    Extremity/Trunk Assessment   Upper Extremity Assessment Upper Extremity Assessment: Defer to OT evaluation    Lower Extremity Assessment Lower Extremity Assessment: Generalized weakness    Cervical / Trunk Assessment Cervical / Trunk Assessment: Kyphotic  Communication   Communication: HOH(wears a hearing aid, but doesn't seem to help)  Cognition Arousal/Alertness: Awake/alert Behavior During Therapy: WFL for tasks assessed/performed Overall Cognitive Status: Within Functional Limits for tasks assessed                                        General Comments General comments (skin integrity, edema, etc.): Desat with ambulation to 85%, so put 2 L O2 [place] on for recovery period and then took it back off once she was in the 90s.  She was 90% at rest on RA when I entered the room.         Assessment/Plan    PT  Assessment Patient needs continued PT services  PT Problem List Decreased balance;Decreased activity tolerance;Decreased strength;Decreased mobility;Decreased knowledge of use of DME;Decreased knowledge of precautions;Cardiopulmonary status limiting activity       PT Treatment Interventions DME instruction;Gait training;Stair training;Functional mobility training;Therapeutic exercise;Therapeutic activities;Balance training;Patient/family education    PT Goals (Current goals can be found in the Care Plan section)  Acute Rehab PT Goals Patient Stated Goal: to get back home to cooking for her family PT Goal Formulation: With patient Time For Goal Achievement: 12/05/18 Potential to Achieve Goals: Good    Frequency Min 3X/week           AM-PAC PT "6 Clicks" Mobility  Outcome Measure Help needed turning from your back to your side while in a flat bed without using bedrails?: None Help needed moving from lying on your back to sitting on the side of a flat bed without using bedrails?: None Help needed moving to and from a bed to a chair (including a wheelchair)?: A Little Help needed standing up from a chair using your arms (e.g., wheelchair or bedside chair)?: A Little Help needed to walk in hospital room?: A Little Help needed climbing 3-5 steps with a railing? : A Little 6 Click Score: 20    End of Session Equipment Utilized During Treatment: Oxygen(2L O2 [place] for recovery from gait) Activity Tolerance: Patient limited by fatigue Patient left: in chair;with call bell/phone within reach Nurse Communication: Mobility status;Other (comment)(drop in sats) PT Visit Diagnosis: Muscle weakness (generalized) (M62.81);Difficulty in walking, not elsewhere classified (R26.2)    Time: 1400-1430 PT Time Calculation (min) (ACUTE ONLY): 30 min   Charges:       Wells Guiles B. Laddie Math, PT, DPT  Acute Rehabilitation [phone] pager #[phone] office  @ [place]:  [phone]   PT Evaluation $PT Eval Moderate Complexity: 1 Mod PT Treatments $Gait Training: 8-22 mins       11/21/2018, 6:01 PM

## 2018-11-21 NOTE — Progress Notes (Signed)
Pt's daughter called requesting an update. Provided update, questions asked and answered. Provided with the unit phone number. Educated on plan of care and nurse update times.

## 2018-11-21 NOTE — Progress Notes (Signed)
PROGRESS NOTE  Kara BrunnerBeatrice S Santiago  ION:[id number] DOB: 1926/02/12 DOA: 11/20/2018 PCP: Stefanie LibelNifong, Ted, MD  Brief Narrative: Kara BrunnerBeatrice S Santiago is a 85 y.o. female with a history of CAD s/p PCI, chronic HFpEF, T2DM, HTN, HLD, PVD, AS s/p TAVR 2016 who presented from home to the ED for shortness of breath, cough, and fever with pleuritic chest pain since developing chest congestion 6/21. Covid test was positive and CXR demonstrated hazy left-sided infiltrate not typical of lobar pneumonia. Due to exertional hypoxia, supplemental oxygen was provided. Inflammatory markers were elevated, PCT undetectable, ECG unchanged and high sensitivity troponin was reassuring at 9 x2. Steroids were started and [facility]   Assessment & Plan: Principal Problem:   Acute respiratory failure with hypoxia (HCC) Active Problems:   HYPERTENSION, BENIGN   Pneumonia due to COVID-19 virus   Hyponatremia   Hypokalemia  Acute respiratory disease due to covid-19 pneumonia: Felt to be at high risk of progression to ARDS based on age alone. CXR not currently completely typical for covid pneumonia, primarily ill-defined infiltrate in left midlung.  - Repeat CXR in AM. - Will continue IV steroids. Decrease 40mg  BID to 60mg  daily (~1.5mg /kg/day).  - Remdesivir not currently available. - With mild hypoxia and infiltrates at this time, do not believe tocilizumab would be of benefit, but will continue monitoring. Does not have exclusion criteria.  - Continue airborne, contact precautions. PPE including surgical gown, gloves, cap, shoe covers, and CAPR used during this encounter in a negative pressure room.  - Check daily labs: CBC w/diff, CMP, d-dimer, ferritin, CRP, LDH. Not done yet today, will reorder.  - Enoxaparin prophylactic dose. D-dimer 1.26.  - Maintain euvolemia/net negative. Euvolemic currently - Avoid NSAIDs - Recommend proning and aggressive use of incentive spirometry if able. - Antitussive prn  ordered.  - Goals of care were discussed, confirmed DNR/DNI. Prognosis is guarded.   T2DM:  - Update HbA1c.  - SSI  CAD s/p PCI: Chest pain is typical of pleuritic etiology. hs-Tn reassuring, ECG unchanged.  - Continue ASA, BB, statin  Chronic HFpEF: Euvolemic. LVEF 60-65% on Echo March 2020. - Continue medications as above  AS s/p TAVR 2016: Normal transvalvular gradient noted on recent echocardiogram.   Hypokalemia: - Supplemented, recheck  Hyponatremia:  - Given NS, recheck metabolic panel today.   HTN:  - Continue norvasc, metoprolol  HLD:  - Continue statin  EtOH use: - If CIWA scores remain low and no withdrawal develops in next 24 hours, would DC CIWA.   DVT prophylaxis: Lovenox 30mg  q24h (was given 40mg  this AM, though with small habitus, this is nearly 1mg /kg and Cr 0.73 indicates CrCl near 6830ml/min) Code Status: DNR Family Communication: Daughter by phone today Disposition Plan: Uncertain. Previously completely independent, driving, shopping, performing iADLs. Do not suspect she could go back to that environment at this time. Will get PT and OT involved.   Consultants:   None  Procedures:   None  Antimicrobials:  None   Subjective: Primary complaint is hacking, constant, severe, nonproductive cough associated with central chest pain which is absent when not coughing. Taking antitussive now. Also has difficulty catching her breath when talking or coughing. No chest pain currently. No palpitations or leg swelling or orthopnea.   Objective: Vitals:   11/21/18 0400 11/21/18 0500 11/21/18 0741 11/21/18 0800  BP: 137/63 (!) 143/60 (!) 177/63 (!) 160/60  Pulse: 70 68  76  Resp: 19 20  18   Temp:  99.2 F (37.3  C) 99.3 F (37.4 C)   TempSrc:  Oral Oral   SpO2: 100% 97%  97%  Weight:      Height:        Intake/Output Summary (Last 24 hours) at 11/21/2018 1051 Last data filed at 11/21/2018 0400 Gross per 24 hour  Intake 240 ml  Output -  Net 240 ml    Filed Weights   11/21/18 0142  Weight: 46.2 kg   Gen: Frail, elderly female  Pulm: Coughing violently, increased respiratory effort but SpO2 89-94% on room air. Crackles on left. CV: Regular rate and rhythm. No murmur, rub, or gallop. No JVD, no pedal edema. GI: Abdomen soft, non-tender, non-distended, with normoactive bowel sounds. No organomegaly or masses felt. Ext: Warm, no deformities Skin: No rashes, lesions or ulcers Neuro: Alert and oriented. No focal neurological deficits. Psych: Judgement and insight appear intact. Mood & affect appropriate.   Data Reviewed: I have personally reviewed following labs and imaging studies  CBC: Recent Labs  Lab 11/20/18 1359  WBC 4.9  HGB 12.5  HCT 36.5  MCV 92.4  PLT 222   Basic Metabolic Panel: Recent Labs  Lab 11/20/18 1359  NA 133*  K 3.3*  CL 98  CO2 24  GLUCOSE 105*  BUN 11  CREATININE 0.73  CALCIUM 8.5*   GFR: Estimated Creatinine Clearance: 31.2 mL/min (by C-G formula based on SCr of 0.73 mg/dL). Liver Function Tests: Recent Labs  Lab 11/20/18 1555  AST 36  ALT 22  ALKPHOS 70  BILITOT 0.7  PROT 7.0  ALBUMIN 3.0*   No results for input(s): LIPASE, AMYLASE in the last 168 hours. No results for input(s): AMMONIA in the last 168 hours. Coagulation Profile: No results for input(s): INR, PROTIME in the last 168 hours. Cardiac Enzymes: No results for input(s): CKTOTAL, CKMB, CKMBINDEX, TROPONINI in the last 168 hours. BNP (last 3 results) No results for input(s): PROBNP in the last 8760 hours. HbA1C: No results for input(s): HGBA1C in the last 72 hours. CBG: Recent Labs  Lab 11/21/18 0739 11/21/18 0817  GLUCAP 103* 109*   Lipid Profile: Recent Labs    11/20/18 1555  TRIG 100   Thyroid Function Tests: No results for input(s): TSH, T4TOTAL, FREET4, T3FREE, THYROIDAB in the last 72 hours. Anemia Panel: Recent Labs    11/20/18 1555  FERRITIN 301   Urine analysis:    Component Value Date/Time    COLORURINE YELLOW 11/27/2014 0731   APPEARANCEUR HAZY (A) 11/27/2014 0731   LABSPEC 1.023 11/27/2014 0731   PHURINE 5.0 11/27/2014 0731   GLUCOSEU NEGATIVE 11/27/2014 0731   HGBUR MODERATE (A) 11/27/2014 0731   BILIRUBINUR NEGATIVE 11/27/2014 0731   KETONESUR NEGATIVE 11/27/2014 0731   PROTEINUR NEGATIVE 11/27/2014 0731   UROBILINOGEN 0.2 11/27/2014 0731   NITRITE NEGATIVE 11/27/2014 0731   LEUKOCYTESUR NEGATIVE 11/27/2014 0731   Recent Results (from the past 240 hour(s))  Blood Culture (routine x 2)     Status: None (Preliminary result)   Collection Time: 11/20/18  3:31 PM   Specimen: BLOOD RIGHT WRIST  Result Value Ref Range Status   Specimen Description BLOOD RIGHT WRIST  Final   Special Requests   Final    BOTTLES DRAWN AEROBIC AND ANAEROBIC Blood Culture adequate volume   Culture   Final    NO GROWTH < 24 HOURS Performed at [facility], [address] [place], [place] [address]    Report Status PENDING  Incomplete  SARS Coronavirus 2 (CEPHEID- Performed  in [place] lab), Hosp Order     Status: Abnormal   Collection Time: 11/20/18  3:33 PM   Specimen: Nasopharyngeal Swab  Result Value Ref Range Status   SARS Coronavirus 2 POSITIVE (A) NEGATIVE Final    Comment: RESULT CALLED TO, READ BACK BY AND VERIFIED WITH: Kandace Parkins RN 17:15 11/20/18 ([email or web address]) (NOTE) If result is NEGATIVE SARS-CoV-2 target nucleic acids are NOT DETECTED. The SARS-CoV-2 RNA is generally detectable in upper and lower  respiratory specimens during the acute phase of infection. The lowest  concentration of SARS-CoV-2 viral copies this assay can detect is 250  copies / mL. A negative result does not preclude SARS-CoV-2 infection  and should not be used as the sole basis for treatment or other  patient management decisions.  A negative result may occur with  improper specimen collection / handling, submission of specimen other  than nasopharyngeal swab, presence of viral mutation(s)  within the  areas targeted by this assay, and inadequate number of viral copies  (<250 copies / mL). A negative result must be combined with clinical  observations, patient history, and epidemiological information. If result is POSITIVE SARS-CoV-2 target nucleic acids are DETECTED. T he SARS-CoV-2 RNA is generally detectable in upper and lower  respiratory specimens during the acute phase of infection.  Positive  results are indicative of active infection with SARS-CoV-2.  Clinical  correlation with patient history and other diagnostic information is  necessary to determine patient infection status.  Positive results do  not rule out bacterial infection or co-infection with other viruses. If result is PRESUMPTIVE POSTIVE SARS-CoV-2 nucleic acids MAY BE PRESENT.   A presumptive positive result was obtained on the submitted specimen  and confirmed on repeat testing.  While 2019 novel coronavirus  (SARS-CoV-2) nucleic acids may be present in the submitted sample  additional confirmatory testing may be necessary for epidemiological  and / or clinical management purposes  to differentiate between  SARS-CoV-2 and other Sarbecovirus currently known to infect humans.  If clinically indicated additional testing with an alternate test  methodology [id number]) is  advised. The SARS-CoV-2 RNA is generally  detectable in upper and lower respiratory specimens during the acute  phase of infection. The expected result is Negative. Fact Sheet for Patients:  StrictlyIdeas.no Fact Sheet for Healthcare Providers: BankingDealers.co.za This test is not yet approved or cleared by the [place] FDA and has been authorized for detection and/or diagnosis of SARS-CoV-2 by FDA under an Emergency Use Authorization (EUA).  This EUA will remain in effect (meaning this test can be used) for the duration of the COVID-19 declaration under Section 564(b)(1) of the Act,  21 U.S.C. section 360bbb-3(b)(1), unless the authorization is terminated or revoked sooner. Performed at [facility], [place] [address], [place], [place] [address]   Blood Culture (routine x 2)     Status: None (Preliminary result)   Collection Time: 11/20/18  3:40 PM   Specimen: BLOOD  Result Value Ref Range Status   Specimen Description BLOOD RIGHT ANTECUBITAL  Final   Special Requests   Final    BOTTLES DRAWN AEROBIC AND ANAEROBIC Blood Culture adequate volume   Culture   Final    NO GROWTH < 12 HOURS Performed at [facility], [place] [address] [place], [place] [address]    Report Status PENDING  Incomplete      Radiology Studies: Dg Chest 2 View  Result Date: 11/20/2018 CLINICAL DATA:  85 year old female with chest pain  and shortness of breath EXAM: CHEST - 2 VIEW COMPARISON:  Chest CT 07/14/2015 and earlier. FINDINGS: Upright AP and lateral views. Sequelae of TAVR redemonstrated. Low lung volumes today. Asymmetric and indistinct opacity in the left mid lung (arrow). Pulmonary vascularity appears stable and within normal limits. No pneumothorax, pleural effusion, or other confluent pulmonary opacity. Visualized tracheal air column is within normal limits. Chronic spinal compression fractures. Stable visualized osseous structures. Calcified aortic atherosclerosis. Negative visible bowel gas pattern. IMPRESSION: 1. Lower lung volumes with patchy indistinct opacity in the left mid lung. This is nonspecific but suspicious for acute infection such as bronchopneumonia. Consider viral/atypical etiologies. No pleural effusion. 2.  Aortic Atherosclerosis (ICD10-I70.0).  Prior TAVR. Electronically Signed   By: Odessa FlemingH  Hall M.D.   On: 11/20/2018 14:54    Scheduled Meds: . amLODipine  5 mg Oral Daily  . aspirin  81 mg Oral Daily  . atorvastatin  80 mg Oral q1800  . enoxaparin (LOVENOX) injection  40 mg Subcutaneous Q24H  . folic acid  1 mg Oral Daily  . insulin aspart  0-9 Units  Subcutaneous TID WC  . methylPREDNISolone (SOLU-MEDROL) injection  40 mg Intravenous Q12H  . metoprolol tartrate  25 mg Oral BID  . multivitamin with minerals  1 tablet Oral Daily  . thiamine  100 mg Oral Daily   Or  . thiamine  100 mg Intravenous Daily   Continuous Infusions:   LOS: 0 days   Time spent: 35 minutes.  Tyrone Nineyan B Isidore Margraf, MD [facility] [email or web address] Password [email or web address] 11/21/2018, 10:51 AM

## 2018-11-21 NOTE — Plan of Care (Signed)
Discussed with patient plan of care for the evening, pain management, incentive spirometry and admission questions and procedures with some teach back displayed

## 2018-11-22 ENCOUNTER — Inpatient Hospital Stay (HOSPITAL_COMMUNITY): Payer: Medicare Other

## 2018-11-22 LAB — COMPREHENSIVE METABOLIC PANEL
ALT: 19 U/L (ref 0–44)
AST: 28 U/L (ref 15–41)
Albumin: 2.9 g/dL — ABNORMAL LOW (ref 3.5–5.0)
Alkaline Phosphatase: 63 U/L (ref 38–126)
Anion gap: 9 (ref 5–15)
BUN: 16 mg/dL (ref 8–23)
CO2: 27 mmol/L (ref 22–32)
Calcium: 8.8 mg/dL — ABNORMAL LOW (ref 8.9–10.3)
Chloride: 99 mmol/L (ref 98–111)
Creatinine, Ser: 0.61 mg/dL (ref 0.44–1.00)
GFR calc Af Amer: >60 mL/min (ref >60)
GFR calc non Af Amer: >60 mL/min (ref >60)
Glucose, Bld: 143 mg/dL — ABNORMAL HIGH (ref 70–99)
Potassium: 5.1 mmol/L (ref 3.5–5.1)
Sodium: 135 mmol/L (ref 135–145)
Total Bilirubin: 0.4 mg/dL (ref 0.3–1.2)
Total Protein: 6.7 g/dL (ref 6.5–8.1)

## 2018-11-22 LAB — CBC WITH DIFFERENTIAL/PLATELET
Abs Immature Granulocytes: 0.01 10*3/uL (ref 0.00–0.07)
Basophils Absolute: 0 10*3/uL (ref 0.0–0.1)
Basophils Relative: 0 %
Eosinophils Absolute: 0 10*3/uL (ref 0.0–0.5)
Eosinophils Relative: 0 %
HCT: 38.5 % (ref 36.0–46.0)
Hemoglobin: 12.5 g/dL (ref 12.0–15.0)
Immature Granulocytes: 0 %
Lymphocytes Relative: 18 %
Lymphs Abs: 0.7 10*3/uL (ref 0.7–4.0)
MCH: 30.6 pg (ref 26.0–34.0)
MCHC: 32.5 g/dL (ref 30.0–36.0)
MCV: 94.4 fL (ref 80.0–100.0)
Monocytes Absolute: 0.2 10*3/uL (ref 0.1–1.0)
Monocytes Relative: 6 %
Neutro Abs: 2.7 10*3/uL (ref 1.7–7.7)
Neutrophils Relative %: 76 %
Platelets: 282 10*3/uL (ref 150–400)
RBC: 4.08 MIL/uL (ref 3.87–5.11)
RDW: 14.3 % (ref 11.5–15.5)
WBC: 3.6 10*3/uL — ABNORMAL LOW (ref 4.0–10.5)
nRBC: 0 % (ref 0.0–0.2)

## 2018-11-22 LAB — GLUCOSE, CAPILLARY
Glucose-Capillary: 141 mg/dL — ABNORMAL HIGH (ref 70–99)
Glucose-Capillary: 188 mg/dL — ABNORMAL HIGH (ref 70–99)
Glucose-Capillary: 181 mg/dL — ABNORMAL HIGH (ref 70–99)
Glucose-Capillary: 214 mg/dL — ABNORMAL HIGH (ref 70–99)

## 2018-11-22 LAB — HEMOGLOBIN A1C
Hgb A1c MFr Bld: 6.1 % — ABNORMAL HIGH (ref 4.8–5.6)
Mean Plasma Glucose: 128.37 mg/dL

## 2018-11-22 LAB — C-REACTIVE PROTEIN: CRP: 13.9 mg/dL — ABNORMAL HIGH (ref <1.0)

## 2018-11-22 LAB — D-DIMER, QUANTITATIVE: D-Dimer, Quant: 1.02 ug/mL-FEU — ABNORMAL HIGH (ref 0.00–0.50)

## 2018-11-22 LAB — LACTATE DEHYDROGENASE: LDH: 263 U/L — ABNORMAL HIGH (ref 98–192)

## 2018-11-22 LAB — FERRITIN: Ferritin: 344 ng/mL — ABNORMAL HIGH (ref 11–307)

## 2018-11-22 NOTE — Progress Notes (Signed)
Family updated. Daughter plans to stay with the patient after discharge. Anxious to have Kara Santiago back at home.

## 2018-11-22 NOTE — Progress Notes (Signed)
PROGRESS NOTE  Kara Santiago  NGE:[id number] DOB: Feb 05, 1926 DOA: 11/20/2018 PCP: Salley Slaughter, MD  Brief Narrative:  Kara Santiago is a 85 y.o. female with a history of CAD s/p PCI, chronic HFpEF, T2DM, HTN, HLD, PVD, AS s/p TAVR 2016 who presented from home to the ED for shortness of breath, cough, and fever with pleuritic chest pain since developing chest congestion 6/21. Covid test was positive and CXR demonstrated hazy left-sided infiltrate not typical of lobar pneumonia. Due to exertional hypoxia, supplemental oxygen was provided. Inflammatory markers were elevated, PCT undetectable, ECG unchanged and high sensitivity troponin was reassuring at 9 x2. Steroids were started and the patient was admitted to [facility].   Subjective: Complains cough today, reports it is mildly productive, otherwise denies any chest pain, palpitation or orthopnea .  Assessment & Plan: Principal Problem:   Acute respiratory failure with hypoxia (HCC) Active Problems:   HYPERTENSION, BENIGN   Pneumonia due to COVID-19 virus   Hyponatremia   Hypokalemia   Acute respiratory disease due to COVID-19 virus  Acute respiratory disease due to covid-19 pneumonia: - Felt to be at high risk of progression to ARDS based on age alone.  Initial chest x-ray done completely typical for COVID-19 for pneumonia, repeat chest x-ray this morning more typical with bilateral opacities/infiltrate . -Continue with IV Solu-Medrol . -Normal procalcitonin, no indication for IV antibiotics. -Given patient does not require oxygen today, I will held on starting Remdesivir, but will keep closely monitoring, with low threshold Remdesivir treatment, especially with significantly elevated inflammatory markers and worsening chest x-ray. - Enoxaparin prophylactic dose. D-dimer 1.26.  - Maintain euvolemia/net negative. Euvolemic currently - Avoid NSAIDs - Recommend proning and aggressive use of incentive spirometry if able. - Antitussive prn  ordered.  - Goals of care were discussed, confirmed DNR/DNI. Prognosis is guarded.   T2DM:  -BG's are controlled on insulin sliding scale  CAD s/p PCI: Chest pain is typical of pleuritic etiology. hs-Tn reassuring, ECG unchanged.  - Continue ASA, BB, statin  Chronic HFpEF: Euvolemic. LVEF 60-65% on Echo March 2020. - Continue medications as above  AS s/p TAVR 2016: Normal transvalvular gradient noted on recent echocardiogram.   Hypokalemia: - Supplemented, recheck  Hyponatremia:  - Given NS, recheck metabolic panel today.   HTN:  - Continue norvasc, metoprolol  HLD:  - Continue statin  EtOH use: - If CIWA scores remain low and no withdrawal develops in next 24 hours, would DC CIWA.   DVT prophylaxis: Lovenox  Code Status: DNR Family Communication: Will discuss with daughter by phone today Disposition Plan: Home  Consultants:   None  Procedures:   None  Antimicrobials:  None    Objective: Vitals:   11/22/18 0520 11/22/18 0752 11/22/18 0900 11/22/18 0920  BP:  (!) 153/51    Pulse:   70 75  Resp:   18 20  Temp: 98.6 F (37 C) 98.2 F (36.8 C)    TempSrc: Oral Oral    SpO2:   93% 94%  Weight:      Height:        Intake/Output Summary (Last 24 hours) at 11/22/2018 1318 Last data filed at 11/22/2018 0900 Gross per 24 hour  Intake 480 ml  Output -  Net 480 ml   Filed Weights   11/21/18 0142  Weight: 46.2 kg   Awake Alert, Oriented X 3, frail, sitting at chair eating breakfast, no new F.N deficits, Normal affect Symmetrical Chest wall movement, Good air movement bilaterally, CTAB  RRR,No Gallops,Rubs or new Murmurs, No Parasternal Heave +ve B.Sounds, Abd Soft, No tenderness, No rebound - guarding or rigidity. No Cyanosis, Clubbing or edema, No new Rash or bruise    Data Reviewed: I have personally reviewed following labs and imaging studies  CBC: Recent Labs  Lab 11/20/18 1359 11/21/18 1200 11/22/18 0210  WBC 4.9 6.0 3.6*  NEUTROABS  --  5.2  2.7  HGB 12.5 13.0 12.5  HCT 36.5 37.5 38.5  MCV 92.4 92.8 94.4  PLT 222 260 282   Basic Metabolic Panel: Recent Labs  Lab 11/20/18 1359 11/21/18 1200 11/22/18 0210  NA 133* 134* 135  K 3.3* 3.8 5.1  CL 98 98 99  CO2 24 23 27   GLUCOSE 105* 154* 143*  BUN 11 10 16   CREATININE 0.73 0.55 0.61  CALCIUM 8.5* 8.5* 8.8*   GFR: Estimated Creatinine Clearance: 31.2 mL/min (by C-G formula based on SCr of 0.61 mg/dL). Liver Function Tests: Recent Labs  Lab 11/20/18 1555 11/21/18 1200 11/22/18 0210  AST 36 33 28  ALT 22 21 19   ALKPHOS 70 71 63  BILITOT 0.7 0.4 0.4  PROT 7.0 7.0 6.7  ALBUMIN 3.0* 3.1* 2.9*   No results for input(s): LIPASE, AMYLASE in the last 168 hours. No results for input(s): AMMONIA in the last 168 hours. Coagulation Profile: No results for input(s): INR, PROTIME in the last 168 hours. Cardiac Enzymes: No results for input(s): CKTOTAL, CKMB, CKMBINDEX, TROPONINI in the last 168 hours. BNP (last 3 results) No results for input(s): PROBNP in the last 8760 hours. HbA1C: Recent Labs    11/22/18 0210  HGBA1C 6.1*   CBG: Recent Labs  Lab 11/21/18 1209 11/21/18 1548 11/21/18 2138 11/22/18 0750 11/22/18 1206  GLUCAP 154* 207* 146* 141* 188*   Lipid Profile: Recent Labs    11/20/18 1555  TRIG 100   Thyroid Function Tests: No results for input(s): TSH, T4TOTAL, FREET4, T3FREE, THYROIDAB in the last 72 hours. Anemia Panel: Recent Labs    11/21/18 1200 11/22/18 0210  FERRITIN 348* 344*   Urine analysis:    Component Value Date/Time   COLORURINE YELLOW 11/27/2014 0731   APPEARANCEUR HAZY (A) 11/27/2014 0731   LABSPEC 1.023 11/27/2014 0731   PHURINE 5.0 11/27/2014 0731   GLUCOSEU NEGATIVE 11/27/2014 0731   HGBUR MODERATE (A) 11/27/2014 0731   BILIRUBINUR NEGATIVE 11/27/2014 0731   KETONESUR NEGATIVE 11/27/2014 0731   PROTEINUR NEGATIVE 11/27/2014 0731   UROBILINOGEN 0.2 11/27/2014 0731   NITRITE NEGATIVE 11/27/2014 0731   LEUKOCYTESUR  NEGATIVE 11/27/2014 0731   Recent Results (from the past 240 hour(s))  Blood Culture (routine x 2)     Status: None (Preliminary result)   Collection Time: 11/20/18  3:31 PM   Specimen: BLOOD RIGHT WRIST  Result Value Ref Range Status   Specimen Description BLOOD RIGHT WRIST  Final   Special Requests   Final    BOTTLES DRAWN AEROBIC AND ANAEROBIC Blood Culture adequate volume   Culture   Final    NO GROWTH 2 DAYS Performed at [facility], [address] [place], [place] [address]    Report Status PENDING  Incomplete  SARS Coronavirus 2 (CEPHEID- Performed in [facility] hospital lab), Hosp Order     Status: Abnormal   Collection Time: 11/20/18  3:33 PM   Specimen: Nasopharyngeal Swab  Result Value Ref Range Status   SARS Coronavirus 2 POSITIVE (A) NEGATIVE Final    Comment: RESULT CALLED TO, READ BACK BY AND  VERIFIED WITH: Gracy BruinsL. Meeks RN 17:15 11/20/18 ([email or web address]) (NOTE) If result is NEGATIVE SARS-CoV-2 target nucleic acids are NOT DETECTED. The SARS-CoV-2 RNA is generally detectable in upper and lower  respiratory specimens during the acute phase of infection. The lowest  concentration of SARS-CoV-2 viral copies this assay can detect is 250  copies / mL. A negative result does not preclude SARS-CoV-2 infection  and should not be used as the sole basis for treatment or other  patient management decisions.  A negative result may occur with  improper specimen collection / handling, submission of specimen other  than nasopharyngeal swab, presence of viral mutation(s) within the  areas targeted by this assay, and inadequate number of viral copies  (<250 copies / mL). A negative result must be combined with clinical  observations, patient history, and epidemiological information. If result is POSITIVE SARS-CoV-2 target nucleic acids are DETECTED. T he SARS-CoV-2 RNA is generally detectable in upper and lower  respiratory specimens during the acute phase of infection.  Positive   results are indicative of active infection with SARS-CoV-2.  Clinical  correlation with patient history and other diagnostic information is  necessary to determine patient infection status.  Positive results do  not rule out bacterial infection or co-infection with other viruses. If result is PRESUMPTIVE POSTIVE SARS-CoV-2 nucleic acids MAY BE PRESENT.   A presumptive positive result was obtained on the submitted specimen  and confirmed on repeat testing.  While 2019 novel coronavirus  (SARS-CoV-2) nucleic acids may be present in the submitted sample  additional confirmatory testing may be necessary for epidemiological  and / or clinical management purposes  to differentiate between  SARS-CoV-2 and other Sarbecovirus currently known to infect humans.  If clinically indicated additional testing with an alternate test  methodology [phone] is  advised. The SARS-CoV-2 RNA is generally  detectable in upper and lower respiratory specimens during the acute  phase of infection. The expected result is Negative. Fact Sheet for Patients:  BoilerBrush.com.cyhttps://www.fda.gov/media/136312/download Fact Sheet for Healthcare Providers: https://pope.com/https://www.fda.gov/media/136313/download This test is not yet approved or cleared by the [place] FDA and has been authorized for detection and/or diagnosis of SARS-CoV-2 by FDA under an Emergency Use Authorization (EUA).  This EUA will remain in effect (meaning this test can be used) for the duration of the COVID-19 declaration under Section 564(b)(1) of the Act, 21 U.S.C. section 360bbb-3(b)(1), unless the authorization is terminated or revoked sooner. Performed at [facility], [address] [place], [place] [address]   Blood Culture (routine x 2)     Status: None (Preliminary result)   Collection Time: 11/20/18  3:40 PM   Specimen: BLOOD  Result Value Ref Range Status   Specimen Description BLOOD RIGHT ANTECUBITAL  Final   Special Requests   Final    BOTTLES  DRAWN AEROBIC AND ANAEROBIC Blood Culture adequate volume   Culture   Final    NO GROWTH 2 DAYS Performed at [facility], [address] [place], [place] [address]    Report Status PENDING  Incomplete      Radiology Studies: Dg Chest 2 View  Result Date: 11/20/2018 CLINICAL DATA:  85 year old female with chest pain and shortness of breath EXAM: CHEST - 2 VIEW COMPARISON:  Chest CT 07/14/2015 and earlier. FINDINGS: Upright AP and lateral views. Sequelae of TAVR redemonstrated. Low lung volumes today. Asymmetric and indistinct opacity in the left mid lung (arrow). Pulmonary vascularity appears stable and within normal limits. No pneumothorax, pleural effusion, or  other confluent pulmonary opacity. Visualized tracheal air column is within normal limits. Chronic spinal compression fractures. Stable visualized osseous structures. Calcified aortic atherosclerosis. Negative visible bowel gas pattern. IMPRESSION: 1. Lower lung volumes with patchy indistinct opacity in the left mid lung. This is nonspecific but suspicious for acute infection such as bronchopneumonia. Consider viral/atypical etiologies. No pleural effusion. 2.  Aortic Atherosclerosis (ICD10-I70.0).  Prior TAVR. Electronically Signed   By: Odessa FlemingH  Hall M.D.   On: 11/20/2018 14:54   Dg Chest Port 1 View  Result Date: 11/22/2018 CLINICAL DATA:  Acute respiratory disease, COVID-19 EXAM: PORTABLE CHEST 1 VIEW COMPARISON:  Portable exam 0546 hours compared to 11/20/2018 FINDINGS: Normal heart size post TAVR and coronary stenting. Mediastinal contours and pulmonary vascularity normal. Atherosclerotic calcification aorta. Hazy infiltrates at the lung bases greater on LEFT, slightly increased particularly on RIGHT since previous study. Minimal infiltrate RIGHT upper lobe. No pleural effusion or pneumothorax. IMPRESSION: BILATERAL pulmonary infiltrates, slightly increased especially on RIGHT. Electronically Signed   By: Ulyses SouthwardMark  Boles M.D.   On:  11/22/2018 08:42    Scheduled Meds: . amLODipine  5 mg Oral Daily  . aspirin  81 mg Oral Daily  . atorvastatin  80 mg Oral q1800  . enoxaparin (LOVENOX) injection  30 mg Subcutaneous Q24H  . folic acid  1 mg Oral Daily  . insulin aspart  0-9 Units Subcutaneous TID WC  . methylPREDNISolone (SOLU-MEDROL) injection  60 mg Intravenous Daily  . metoprolol tartrate  25 mg Oral BID  . multivitamin with minerals  1 tablet Oral Daily  . thiamine  100 mg Oral Daily   Or  . thiamine  100 mg Intravenous Daily   Continuous Infusions:   LOS: 1 day   Time spent: 25 minutes.  Huey Bienenstockawood Kiran Lapine, MD [facility] [email or web address] Password [email or web address] 11/22/2018, 1:18 PM

## 2018-11-22 NOTE — Evaluation (Signed)
Occupational Therapy Evaluation Patient Details Name: Kara Santiago MRN: [id number] DOB: 15-Aug-1925 Today's Date: 11/22/2018    History of Present Illness 85 y.o. female admitted on 11/20/18 for SOB, cough, fever.  Dx with acute respiratory disease due to COVID 19 (+) PNA, hypokalemia, hyponatremia.  Pt with other significant PMH of severe aortic stenosis, mitral regurgitation, HTN, DM, CVD, CHF, CAD, s/p TAVR.     Clinical Impression   PTA Pt independent in all aspects of mobility and ADL/IADL - even still mowing her yard and cooking dinner for her family 1/wk. Today Pt was on RA throughout session, ambualted with one person HHA at min A. she did desaturate to 85% with ambulation to bathroom for toileting and with standing grooming at sink level at min guard level. She was able to recover with seated rest breaks and pursed lip breathing. She managed her own underwear, and was able to get in a prone position independently at the end of session. She will benefit from skilled OT in the acute setting as well as afterwards at the [facility] level to maximize safety and independence in ADL AND IADL. Next session take HEP with theraband, perform energy conservation education and talk about shower safety.     Follow Up Recommendations  No OT follow up;Supervision/Assistance - 24 hour(initially)    Equipment Recommendations  Tub/shower seat    Recommendations for Other Services       Precautions / Restrictions Precautions Precautions: Other (comment) Precaution Comments: monitor O2 sats Restrictions Weight Bearing Restrictions: No      Mobility Bed Mobility Overal bed mobility: Modified Independent                Transfers Overall transfer level: Needs assistance Equipment used: 1 person hand held assist Transfers: Sit to/from Stand Sit to Stand: Min guard;Min assist         General transfer comment: min guard assist for safety and balance    Balance Overall balance  assessment: Needs assistance Sitting-balance support: Feet supported;No upper extremity supported Sitting balance-Leahy Scale: Good     Standing balance support: Bilateral upper extremity supported;No upper extremity supported;Single extremity supported Standing balance-Leahy Scale: Fair Standing balance comment: close supervision in static standing, HHA for dynamic balance                           ADL either performed or assessed with clinical judgement   ADL Overall ADL's : Needs assistance/impaired Eating/Feeding: Independent   Grooming: Min guard;Wash/dry hands;Standing Grooming Details (indicate cue type and reason): decreased activity tolerance and DOE at sink Upper Body Bathing: Set up;Sitting   Lower Body Bathing: Min guard;Sitting/lateral leans   Upper Body Dressing : Set up;Sitting   Lower Body Dressing: Set up;Sitting/lateral leans Lower Body Dressing Details (indicate cue type and reason): able to don/doff socks sitting in chair without assist Toilet Transfer: Minimal assistance;Ambulation(HHA)   Toileting- Water quality scientist and Hygiene: Min guard;Sit to/from stand       Functional mobility during ADLs: Minimal assistance General ADL Comments: DOE 2/4, decreased activity tolerance, generalized weakness     Vision Patient Visual Report: No change from baseline       Perception     Praxis      Pertinent Vitals/Pain Pain Assessment: No/denies pain     Hand Dominance Right   Extremity/Trunk Assessment Upper Extremity Assessment Upper Extremity Assessment: Generalized weakness   Lower Extremity Assessment Lower Extremity Assessment: Generalized weakness   Cervical /  Trunk Assessment Cervical / Trunk Assessment: Kyphotic   Communication Communication Communication: HOH(wears a hearing aid, but doesn't seem to help)   Cognition Arousal/Alertness: Awake/alert Behavior During Therapy: WFL for tasks assessed/performed Overall Cognitive  Status: Within Functional Limits for tasks assessed                                     General Comments  Initially Pt at 93% on RA, desaturated to 87% with activity and DOE 2/4, remained on RA, and quickly returns to 90 or above with seated rest break and pursed lip breathing. At end of session, Pt prone on bed and saturations at 94% on RA    Exercises     Shoulder Instructions      Home Living Family/patient expects to be discharged to:: Private residence Living Arrangements: Alone Available Help at Discharge: Family;Available PRN/intermittently Type of Home: House Home Access: Stairs to enter     Home Layout: One level;Laundry or work area in basement     Foot LockerBathroom Shower/Tub: Chief Strategy OfficerTub/shower unit   Bathroom Toilet: Hewlett[employer]ardStandard     Home Equipment: None          Prior Functioning/Environment Level of Independence: Independent        Comments: drives, cooks for family once a week, does some light yard work.         OT Problem List: Decreased activity tolerance;Cardiopulmonary status limiting activity;Decreased knowledge of use of DME or AE      OT Treatment/Interventions: Self-care/ADL training;Therapeutic exercise;Energy conservation;DME and/or AE instruction;Therapeutic activities;Patient/family education;Balance training    OT Goals(Current goals can be found in the care plan section) Acute Rehab OT Goals Patient Stated Goal: to get back home to cooking for her family OT Goal Formulation: With patient Time For Goal Achievement: 12/06/18 Potential to Achieve Goals: Good ADL Goals Pt Will Perform Grooming: with modified independence;standing Pt Will Transfer to Toilet: with modified independence;ambulating Pt Will Perform Toileting - Clothing Manipulation and hygiene: with modified independence;sit to/from stand Pt Will Perform Tub/Shower Transfer: Tub transfer;with supervision;ambulating;shower seat Pt/caregiver will Perform Home Exercise Program:  Both right and left upper extremity;Independently;With theraband;With written HEP provided Additional ADL Goal #1: Pt will recall 3 ways of conserving energy while performing ADL at independent level  OT Frequency: Min 3X/week   Barriers to D/C:            Co-evaluation              AM-PAC OT "6 Clicks" Daily Activity     Outcome Measure Help from another person eating meals?: None Help from another person taking care of personal grooming?: A Little Help from another person toileting, which includes using toliet, bedpan, or urinal?: A Little Help from another person bathing (including washing, rinsing, drying)?: A Little Help from another person to put on and taking off regular upper body clothing?: A Little Help from another person to put on and taking off regular lower body clothing?: A Little 6 Click Score: 19   End of Session Equipment Utilized During Treatment: Gait belt Nurse Communication: Mobility status(in prone)  Activity Tolerance: Patient tolerated treatment well Patient left: in bed;with call bell/phone within reach  OT Visit Diagnosis: Unsteadiness on feet (R26.81);Muscle weakness (generalized) (M62.81);Other (comment)(decreased activity tolerance)                Time: [id number] OT Time Calculation (min): 25 min Charges:  OT General Charges $OT  Visit: 1 Visit OT Evaluation $OT Eval Moderate Complexity: 1 Mod OT Treatments $Self Care/Home Management : 8-22 mins  Sherryl MangesLaura Senie Lanese OTR/L Acute Rehabilitation Services Pager: [phone] Office: [phone]  Evern BioLaura J Zenda Herskowitz 11/22/2018, 4:58 PM

## 2018-11-23 LAB — COMPREHENSIVE METABOLIC PANEL
ALT: 24 U/L (ref 0–44)
AST: 32 U/L (ref 15–41)
Albumin: 3.1 g/dL — ABNORMAL LOW (ref 3.5–5.0)
Alkaline Phosphatase: 65 U/L (ref 38–126)
Anion gap: 10 (ref 5–15)
BUN: 20 mg/dL (ref 8–23)
CO2: 29 mmol/L (ref 22–32)
Calcium: 8.9 mg/dL (ref 8.9–10.3)
Chloride: 97 mmol/L — ABNORMAL LOW (ref 98–111)
Creatinine, Ser: 0.56 mg/dL (ref 0.44–1.00)
GFR calc Af Amer: >60 mL/min (ref >60)
GFR calc non Af Amer: >60 mL/min (ref >60)
Glucose, Bld: 138 mg/dL — ABNORMAL HIGH (ref 70–99)
Potassium: 4.5 mmol/L (ref 3.5–5.1)
Sodium: 136 mmol/L (ref 135–145)
Total Bilirubin: 0.4 mg/dL (ref 0.3–1.2)
Total Protein: 7 g/dL (ref 6.5–8.1)

## 2018-11-23 LAB — CBC WITH DIFFERENTIAL/PLATELET
Abs Immature Granulocytes: 0.03 10*3/uL (ref 0.00–0.07)
Basophils Absolute: 0 10*3/uL (ref 0.0–0.1)
Basophils Relative: 0 %
Eosinophils Absolute: 0 10*3/uL (ref 0.0–0.5)
Eosinophils Relative: 0 %
HCT: 39.4 % (ref 36.0–46.0)
Hemoglobin: 13 g/dL (ref 12.0–15.0)
Immature Granulocytes: 0 %
Lymphocytes Relative: 10 %
Lymphs Abs: 0.7 10*3/uL (ref 0.7–4.0)
MCH: 30.9 pg (ref 26.0–34.0)
MCHC: 33 g/dL (ref 30.0–36.0)
MCV: 93.6 fL (ref 80.0–100.0)
Monocytes Absolute: 0.5 10*3/uL (ref 0.1–1.0)
Monocytes Relative: 8 %
Neutro Abs: 5.6 10*3/uL (ref 1.7–7.7)
Neutrophils Relative %: 82 %
Platelets: 366 10*3/uL (ref 150–400)
RBC: 4.21 MIL/uL (ref 3.87–5.11)
RDW: 14.3 % (ref 11.5–15.5)
WBC: 6.8 10*3/uL (ref 4.0–10.5)
nRBC: 0 % (ref 0.0–0.2)

## 2018-11-23 LAB — C-REACTIVE PROTEIN: CRP: 6.2 mg/dL — ABNORMAL HIGH (ref <1.0)

## 2018-11-23 LAB — GLUCOSE, CAPILLARY
Glucose-Capillary: 127 mg/dL — ABNORMAL HIGH (ref 70–99)
Glucose-Capillary: 168 mg/dL — ABNORMAL HIGH (ref 70–99)
Glucose-Capillary: 212 mg/dL — ABNORMAL HIGH (ref 70–99)
Glucose-Capillary: 176 mg/dL — ABNORMAL HIGH (ref 70–99)

## 2018-11-23 LAB — LACTATE DEHYDROGENASE: LDH: 295 U/L — ABNORMAL HIGH (ref 98–192)

## 2018-11-23 LAB — FERRITIN: Ferritin: 372 ng/mL — ABNORMAL HIGH (ref 11–307)

## 2018-11-23 LAB — D-DIMER, QUANTITATIVE: D-Dimer, Quant: 0.87 ug/mL-FEU — ABNORMAL HIGH (ref 0.00–0.50)

## 2018-11-23 NOTE — Plan of Care (Signed)

## 2018-11-23 NOTE — Progress Notes (Signed)
Updated pt's family member Enid Derry on pt's current plan of care. Enid Derry requested to bring batteries for pt's hearing aid batteries. Explained to her how to drop them off and notified AC.

## 2018-11-23 NOTE — Progress Notes (Signed)
Physical Therapy Treatment Patient Details Name: Kara Santiago MRN: [id number] DOB: December 25, 1925 Today's Date: 11/23/2018    History of Present Illness 85 y.o. female admitted on 11/20/18 for SOB, cough, fever.  Dx with acute respiratory disease due to COVID 19 (+) PNA, hypokalemia, hyponatremia.  Pt with other significant PMH of severe aortic stenosis, mitral regurgitation, HTN, DM, CVD, CHF, CAD, s/p TAVR.      PT Comments    Patient reports feeling badly today. Noted frequent coughing. Patient ambulated on RA, Pleth not  Picking up SaO2 while ambulating. 92% after return to room and on monitor.Continue PT and monitoring of VS.   Follow Up Recommendations  Home health PT;Supervision - Intermittent     Equipment Recommendations  None recommended by PT    Recommendations for Other Services       Precautions / Restrictions Precautions Precaution Comments: monitor O2 sats    Mobility  Bed Mobility               General bed mobility comments: oob  Transfers Overall transfer level: Needs assistance Equipment used: Rolling walker (2 wheeled) Transfers: Sit to/from Stand Sit to Stand: Min guard            Ambulation/Gait Ambulation/Gait assistance: Min guard Gait Distance (Feet): 100 Feet Assistive device: Rolling walker (2 wheeled) Gait Pattern/deviations: Step-through pattern Gait velocity: decr   General Gait Details: slow  gait speed.   Stairs             Wheelchair Mobility    Modified Rankin (Stroke Patients Only)       Balance   Sitting-balance support: Feet supported;No upper extremity supported Sitting balance-Leahy Scale: Good     Standing balance support: Bilateral upper extremity supported;No upper extremity supported;Single extremity supported Standing balance-Leahy Scale: Fair                              Cognition Arousal/Alertness: Awake/alert Behavior During Therapy: Restless                                    General Comments: pt states that she does not feel well      Exercises      General Comments        Pertinent Vitals/Pain Pain Assessment: Faces Faces Pain Scale: Hurts little more Pain Location: rubs sternum Pain Descriptors / Indicators: Discomfort Pain Intervention(s): Monitored during session    Home Living                      Prior Function            PT Goals (current goals can now be found in the care plan section) Progress towards PT goals: Progressing toward goals    Frequency    Min 3X/week      PT Plan Current plan remains appropriate    Co-evaluation              AM-PAC PT "6 Clicks" Mobility   Outcome Measure  Help needed turning from your back to your side while in a flat bed without using bedrails?: None Help needed moving from lying on your back to sitting on the side of a flat bed without using bedrails?: None Help needed moving to and from a bed to a chair (including a wheelchair)?: A Little Help needed standing up  from a chair using your arms (e.g., wheelchair or bedside chair)?: A Little Help needed to walk in hospital room?: A Little Help needed climbing 3-5 steps with a railing? : A Little 6 Click Score: 20    End of Session   Activity Tolerance: Patient limited by fatigue Patient left: in chair;with call bell/phone within reach Nurse Communication: Mobility status;Other (comment) PT Visit Diagnosis: Muscle weakness (generalized) (M62.81);Difficulty in walking, not elsewhere classified (R26.2)     Time: [id number] PT Time Calculation (min) (ACUTE ONLY): 28 min  Charges:  $Gait Training: 23-37 mins                     Blanchard KelchKaren Reinaldo Helt PT Acute Rehabilitation Services [phone] Office [phone]    Rada HayHill, Jimeka Balan Elizabeth 11/23/2018, 2:25 PM

## 2018-11-23 NOTE — Progress Notes (Signed)
PROGRESS NOTE  Kara BrunnerBeatrice S Santiago  RUE:[id number] DOB: 24-Feb-1926 DOA: 11/20/2018 PCP: Stefanie LibelNifong, Ted, MD  Brief Narrative:  Kara BrunnerBeatrice S Santiago is a 85 y.o. female with a history of CAD s/p PCI, chronic HFpEF, T2DM, HTN, HLD, PVD, AS s/p TAVR 2016 who presented from home to the ED for shortness of breath, cough, and fever with pleuritic chest pain since developing chest congestion 6/21. Covid test was positive and CXR demonstrated hazy left-sided infiltrate not typical of lobar pneumonia. Due to exertional hypoxia, supplemental oxygen was provided. Inflammatory markers were elevated, PCT undetectable, ECG unchanged and high sensitivity troponin was reassuring at 9 x2. Steroids were started and [facility]   Subjective: Reports she is not feeling well today, she is feeling bad, and this is due to cough,which prevented her from appropriate sleeping  Assessment & Plan: Principal Problem:   Acute respiratory failure with hypoxia (HCC) Active Problems:   HYPERTENSION, BENIGN   Pneumonia due to COVID-19 virus   Hyponatremia   Hypokalemia   Acute respiratory disease due to COVID-19 virus  Acute respiratory disease due to covid-19 pneumonia: - Felt to be at high risk of progression to ARDS based on age alone.  Initial chest x-ray done completely typical for COVID-19 for pneumonia, repeat chest x-ray this morning more typical with bilateral opacities/infiltrate . -Continue with IV Solu-Medrol .  CRP trending down which is reassuring -Normal procalcitonin, no indication for IV antibiotics. -Remains with no oxygen requirement, so far no indication for Remdesivir , - Enoxaparin prophylactic dose. D-dimer 1.26.  - Maintain euvolemia/net negative. Euvolemic currently - Avoid NSAIDs - Recommend proning and aggressive use of incentive spirometry if able. - Antitussive prn ordered.  - Goals of care were discussed, confirmed DNR/DNI. Prognosis is guarded.   T2DM:  -BG's are  controlled on insulin sliding scale  CAD s/p PCI: Chest pain is typical of pleuritic etiology. hs-Tn reassuring, ECG unchanged.  - Continue ASA, BB, statin  Chronic HFpEF: Euvolemic. LVEF 60-65% on Echo March 2020. - Continue medications as above  AS s/p TAVR 2016: Normal transvalvular gradient noted on recent echocardiogram.   Hypokalemia: - Supplemented, recheck  Hyponatremia:  -Resolved with normal saline  HTN:  - Continue norvasc, metoprolol  HLD:  - Continue statin  EtOH use: -disussed with the patient, she denies heavy alcohol use, she reports she might drink 1 glass of wine every week, denies of withdrawals, will discontinue CIWA protocol.  DVT prophylaxis: Lovenox  Code Status: DNR Family Communication: discussed with daughter by phone 7/1 Disposition Plan: Home  Consultants:   None  Procedures:   None  Antimicrobials:  None    Objective: Vitals:   11/22/18 1550 11/22/18 2000 11/23/18 0500 11/23/18 0828  BP: (!) 134/55 (!) 144/85 (!) 151/62 (!) 157/68  Pulse:  62 (!) 57   Resp:  (!) 22 (!) 23   Temp: 98.6 F (37 C) 98.4 F (36.9 C) 98.5 F (36.9 C) 98.7 F (37.1 C)  TempSrc: Oral Oral Oral Oral  SpO2:  95% 97%   Weight:      Height:        Intake/Output Summary (Last 24 hours) at 11/23/2018 1421 Last data filed at 11/23/2018 0900 Gross per 24 hour  Intake 480 ml  Output -  Net 480 ml   Filed Weights   11/21/18 0142  Weight: 46.2 kg   Awake Alert, Oriented X 3, No new F.N deficits, Normal affect, frail, Symmetrical Chest wall movement, Good air movement  bilaterally, CTAB RRR,No Gallops,Rubs or new Murmurs, No Parasternal Heave +ve B.Sounds, Abd Soft, No tenderness, No rebound - guarding or rigidity. No Cyanosis, Clubbing or edema, No new Rash or bruise     Data Reviewed: I have personally reviewed following labs and imaging studies  CBC: Recent Labs  Lab 11/20/18 1359 11/21/18 1200 11/22/18 0210 11/23/18 0300  WBC 4.9 6.0 3.6*  6.8  NEUTROABS  --  5.2 2.7 5.6  HGB 12.5 13.0 12.5 13.0  HCT 36.5 37.5 38.5 39.4  MCV 92.4 92.8 94.4 93.6  PLT 222 260 282 952   Basic Metabolic Panel: Recent Labs  Lab 11/20/18 1359 11/21/18 1200 11/22/18 0210 11/23/18 0300  NA 133* 134* 135 136  K 3.3* 3.8 5.1 4.5  CL 98 98 99 97*  CO2 24 23 27 29   GLUCOSE 105* 154* 143* 138*  BUN 11 10 16 20   CREATININE 0.73 0.55 0.61 0.56  CALCIUM 8.5* 8.5* 8.8* 8.9   GFR: Estimated Creatinine Clearance: 31.2 mL/min (by C-G formula based on SCr of 0.56 mg/dL). Liver Function Tests: Recent Labs  Lab 11/20/18 1555 11/21/18 1200 11/22/18 0210 11/23/18 0300  AST 36 33 28 32  ALT 22 21 19 24   ALKPHOS 70 71 63 65  BILITOT 0.7 0.4 0.4 0.4  PROT 7.0 7.0 6.7 7.0  ALBUMIN 3.0* 3.1* 2.9* 3.1*   No results for input(s): LIPASE, AMYLASE in the last 168 hours. No results for input(s): AMMONIA in the last 168 hours. Coagulation Profile: No results for input(s): INR, PROTIME in the last 168 hours. Cardiac Enzymes: No results for input(s): CKTOTAL, CKMB, CKMBINDEX, TROPONINI in the last 168 hours. BNP (last 3 results) No results for input(s): PROBNP in the last 8760 hours. HbA1C: Recent Labs    11/22/18 0210  HGBA1C 6.1*   CBG: Recent Labs  Lab 11/22/18 1206 11/22/18 1704 11/22/18 2109 11/23/18 0830 11/23/18 1124  GLUCAP 188* 181* 214* 127* 168*   Lipid Profile: Recent Labs    11/20/18 1555  TRIG 100   Thyroid Function Tests: No results for input(s): TSH, T4TOTAL, FREET4, T3FREE, THYROIDAB in the last 72 hours. Anemia Panel: Recent Labs    11/22/18 0210 11/23/18 0317  FERRITIN 344* 372*   Urine analysis:    Component Value Date/Time   COLORURINE YELLOW 11/27/2014 0731   APPEARANCEUR HAZY (A) 11/27/2014 0731   LABSPEC 1.023 11/27/2014 0731   PHURINE 5.0 11/27/2014 0731   GLUCOSEU NEGATIVE 11/27/2014 0731   HGBUR MODERATE (A) 11/27/2014 0731   BILIRUBINUR NEGATIVE 11/27/2014 0731   KETONESUR NEGATIVE  11/27/2014 0731   PROTEINUR NEGATIVE 11/27/2014 0731   UROBILINOGEN 0.2 11/27/2014 0731   NITRITE NEGATIVE 11/27/2014 0731   LEUKOCYTESUR NEGATIVE 11/27/2014 0731   Recent Results (from the past 240 hour(s))  Blood Culture (routine x 2)     Status: None (Preliminary result)   Collection Time: 11/20/18  3:31 PM   Specimen: BLOOD RIGHT WRIST  Result Value Ref Range Status   Specimen Description BLOOD RIGHT WRIST  Final   Special Requests   Final    BOTTLES DRAWN AEROBIC AND ANAEROBIC Blood Culture adequate volume   Culture   Final    NO GROWTH 3 DAYS Performed at [facility], [place] [address], [place], [place] [address]    Report Status PENDING  Incomplete  SARS Coronavirus 2 (CEPHEID- Performed in [place] hospital lab), Hosp Order     Status: Abnormal   Collection Time: 11/20/18  3:33 PM  Specimen: Nasopharyngeal Swab  Result Value Ref Range Status   SARS Coronavirus 2 POSITIVE (A) NEGATIVE Final    Comment: RESULT CALLED TO, READ BACK BY AND VERIFIED WITH: Gracy BruinsL. Meeks RN 17:15 11/20/18 ([email or web address]) (NOTE) If result is NEGATIVE SARS-CoV-2 target nucleic acids are NOT DETECTED. The SARS-CoV-2 RNA is generally detectable in upper and lower  respiratory specimens during the acute phase of infection. The lowest  concentration of SARS-CoV-2 viral copies this assay can detect is 250  copies / mL. A negative result does not preclude SARS-CoV-2 infection  and should not be used as the sole basis for treatment or other  patient management decisions.  A negative result may occur with  improper specimen collection / handling, submission of specimen other  than nasopharyngeal swab, presence of viral mutation(s) within the  areas targeted by this assay, and inadequate number of viral copies  (<250 copies / mL). A negative result must be combined with clinical  observations, patient history, and epidemiological information. If result is POSITIVE SARS-CoV-2 target nucleic acids are  DETECTED. T he SARS-CoV-2 RNA is generally detectable in upper and lower  respiratory specimens during the acute phase of infection.  Positive  results are indicative of active infection with SARS-CoV-2.  Clinical  correlation with patient history and other diagnostic information is  necessary to determine patient infection status.  Positive results do  not rule out bacterial infection or co-infection with other viruses. If result is PRESUMPTIVE POSTIVE SARS-CoV-2 nucleic acids MAY BE PRESENT.   A presumptive positive result was obtained on the submitted specimen  and confirmed on repeat testing.  While 2019 novel coronavirus  (SARS-CoV-2) nucleic acids may be present in the submitted sample  additional confirmatory testing may be necessary for epidemiological  and / or clinical management purposes  to differentiate between  SARS-CoV-2 and other Sarbecovirus currently known to infect humans.  If clinically indicated additional testing with an alternate test  methodology [phone]([id number]) is  advised. The SARS-CoV-2 RNA is generally  detectable in upper and lower respiratory specimens during the acute  phase of infection. The expected result is Negative. Fact Sheet for Patients:  BoilerBrush.com.cyhttps://www.fda.gov/media/136312/download Fact Sheet for Healthcare Providers: https://pope.com/https://www.fda.gov/media/136313/download This test is not yet approved or cleared by the [place] FDA and has been authorized for detection and/or diagnosis of SARS-CoV-2 by FDA under an Emergency Use Authorization (EUA).  This EUA will remain in effect (meaning this test can be used) for the duration of the COVID-19 declaration under Section 564(b)(1) of the Act, 21 U.S.C. section 360bbb-3(b)(1), unless the authorization is terminated or revoked sooner. Performed at [facility], [address] [place], [place] [address]   Blood Culture (routine x 2)     Status: None (Preliminary result)   Collection Time: 11/20/18  3:40  PM   Specimen: BLOOD  Result Value Ref Range Status   Specimen Description BLOOD RIGHT ANTECUBITAL  Final   Special Requests   Final    BOTTLES DRAWN AEROBIC AND ANAEROBIC Blood Culture adequate volume   Culture   Final    NO GROWTH 3 DAYS Performed at [facility], [address] [place], [place] [address]    Report Status PENDING  Incomplete      Radiology Studies: Dg Chest Port 1 View  Result Date: 11/22/2018 CLINICAL DATA:  Acute respiratory disease, COVID-19 EXAM: PORTABLE CHEST 1 VIEW COMPARISON:  Portable exam 0546 hours compared to 11/20/2018 FINDINGS: Normal heart size post TAVR and coronary stenting. Mediastinal  contours and pulmonary vascularity normal. Atherosclerotic calcification aorta. Hazy infiltrates at the lung bases greater on LEFT, slightly increased particularly on RIGHT since previous study. Minimal infiltrate RIGHT upper lobe. No pleural effusion or pneumothorax. IMPRESSION: BILATERAL pulmonary infiltrates, slightly increased especially on RIGHT. Electronically Signed   By: Ulyses SouthwardMark  Boles M.D.   On: 11/22/2018 08:42    Scheduled Meds: . amLODipine  5 mg Oral Daily  . aspirin  81 mg Oral Daily  . atorvastatin  80 mg Oral q1800  . enoxaparin (LOVENOX) injection  30 mg Subcutaneous Q24H  . folic acid  1 mg Oral Daily  . insulin aspart  0-9 Units Subcutaneous TID WC  . methylPREDNISolone (SOLU-MEDROL) injection  60 mg Intravenous Daily  . metoprolol tartrate  25 mg Oral BID  . multivitamin with minerals  1 tablet Oral Daily  . thiamine  100 mg Oral Daily   Or  . thiamine  100 mg Intravenous Daily   Continuous Infusions:   LOS: 2 days   Time spent: 25 minutes.  Huey Bienenstockawood Lucie Friedlander, MD [facility] [email or web address] Password [email or web address] 11/23/2018, 2:21 PM

## 2018-11-24 LAB — CBC WITH DIFFERENTIAL/PLATELET
Abs Immature Granulocytes: 0.03 10*3/uL (ref 0.00–0.07)
Basophils Absolute: 0 10*3/uL (ref 0.0–0.1)
Basophils Relative: 0 %
Eosinophils Absolute: 0 10*3/uL (ref 0.0–0.5)
Eosinophils Relative: 0 %
HCT: 39.4 % (ref 36.0–46.0)
Hemoglobin: 13.3 g/dL (ref 12.0–15.0)
Immature Granulocytes: 1 %
Lymphocytes Relative: 11 %
Lymphs Abs: 0.7 10*3/uL (ref 0.7–4.0)
MCH: 31.4 pg (ref 26.0–34.0)
MCHC: 33.8 g/dL (ref 30.0–36.0)
MCV: 92.9 fL (ref 80.0–100.0)
Monocytes Absolute: 0.7 10*3/uL (ref 0.1–1.0)
Monocytes Relative: 11 %
Neutro Abs: 4.7 10*3/uL (ref 1.7–7.7)
Neutrophils Relative %: 77 %
Platelets: 404 10*3/uL — ABNORMAL HIGH (ref 150–400)
RBC: 4.24 MIL/uL (ref 3.87–5.11)
RDW: 14.2 % (ref 11.5–15.5)
WBC: 6.1 10*3/uL (ref 4.0–10.5)
nRBC: 0 % (ref 0.0–0.2)

## 2018-11-24 LAB — COMPREHENSIVE METABOLIC PANEL
ALT: 26 U/L (ref 0–44)
AST: 30 U/L (ref 15–41)
Albumin: 2.9 g/dL — ABNORMAL LOW (ref 3.5–5.0)
Alkaline Phosphatase: 70 U/L (ref 38–126)
Anion gap: 11 (ref 5–15)
BUN: 17 mg/dL (ref 8–23)
CO2: 28 mmol/L (ref 22–32)
Calcium: 8.9 mg/dL (ref 8.9–10.3)
Chloride: 96 mmol/L — ABNORMAL LOW (ref 98–111)
Creatinine, Ser: 0.55 mg/dL (ref 0.44–1.00)
GFR calc Af Amer: >60 mL/min (ref >60)
GFR calc non Af Amer: >60 mL/min (ref >60)
Glucose, Bld: 115 mg/dL — ABNORMAL HIGH (ref 70–99)
Potassium: 3.9 mmol/L (ref 3.5–5.1)
Sodium: 135 mmol/L (ref 135–145)
Total Bilirubin: 0.3 mg/dL (ref 0.3–1.2)
Total Protein: 6.6 g/dL (ref 6.5–8.1)

## 2018-11-24 LAB — C-REACTIVE PROTEIN: CRP: 2.6 mg/dL — ABNORMAL HIGH (ref <1.0)

## 2018-11-24 LAB — LACTATE DEHYDROGENASE: LDH: 282 U/L — ABNORMAL HIGH (ref 98–192)

## 2018-11-24 LAB — GLUCOSE, CAPILLARY
Glucose-Capillary: 100 mg/dL — ABNORMAL HIGH (ref 70–99)
Glucose-Capillary: 105 mg/dL — ABNORMAL HIGH (ref 70–99)
Glucose-Capillary: 162 mg/dL — ABNORMAL HIGH (ref 70–99)

## 2018-11-24 LAB — D-DIMER, QUANTITATIVE: D-Dimer, Quant: 0.62 ug/mL-FEU — ABNORMAL HIGH (ref 0.00–0.50)

## 2018-11-24 LAB — FERRITIN: Ferritin: 323 ng/mL — ABNORMAL HIGH (ref 11–307)

## 2018-11-24 MED ORDER — DEXAMETHASONE 6 MG PO TABS: 6.0000 mg | ORAL_TABLET | Freq: Every day | ORAL | 0 refills | Status: AC

## 2018-11-24 MED ORDER — ACETAMINOPHEN 325 MG PO TABS: 650.0000 mg | ORAL_TABLET | Freq: Four times a day (QID) | ORAL | Status: DC | PRN

## 2018-11-24 MED ORDER — FAMOTIDINE 20 MG PO TABS: 20.0000 mg | ORAL_TABLET | Freq: Two times a day (BID) | ORAL | 0 refills | Status: DC

## 2018-11-24 NOTE — Discharge Instructions (Signed)
Person Under Monitoring Name: Kara Santiago  Location: [address] [place] [address]   Infection Prevention Recommendations for Individuals Confirmed to have, or Being Evaluated for, 2019 Novel Coronavirus (COVID-19) Infection Who Receive Care at Home  Individuals who are confirmed to have, or are being evaluated for, COVID-19 should follow the prevention steps below until a healthcare provider or local or state health department says they can return to normal activities.  Stay home except to get medical care You should restrict activities outside your home, except for getting medical care. Do not go to work, school, or public areas, and do not use public transportation or taxis.  Call ahead before visiting your doctor Before your medical appointment, call the healthcare provider and tell them that you have, or are being evaluated for, COVID-19 infection. This will help the healthcare providers office take steps to keep other people from getting infected. Ask your healthcare provider to call the local or state health department.  Monitor your symptoms Seek prompt medical attention if your illness is worsening (e.g., difficulty breathing). Before going to your medical appointment, call the healthcare provider and tell them that you have, or are being evaluated for, COVID-19 infection. Ask your healthcare provider to call the local or state health department.  Wear a facemask You should wear a facemask that covers your nose and mouth when you are in the same room with other people and when you visit a healthcare provider. People who live with or visit you should also wear a facemask while they are in the same room with you.  Separate yourself from other people in your home As much as possible, you should stay in a different room from other people in your home. Also, you should use a separate bathroom, if available.  Avoid sharing household items You should not  share dishes, drinking glasses, cups, eating utensils, towels, bedding, or other items with other people in your home. After using these items, you should wash them thoroughly with soap and water.  Cover your coughs and sneezes Cover your mouth and nose with a tissue when you cough or sneeze, or you can cough or sneeze into your sleeve. Throw used tissues in a lined trash can, and immediately wash your hands with soap and water for at least 20 seconds or use an alcohol-based hand rub.  Wash your [facility] your hands often and thoroughly with soap and water for at least 20 seconds. You can use an alcohol-based hand sanitizer if soap and water are not available and if your hands are not visibly dirty. Avoid touching your eyes, nose, and mouth with unwashed hands.   Prevention Steps for Caregivers and Household Members of Individuals Confirmed to have, or Being Evaluated for, COVID-19 Infection Being Cared for in the Home  If you live with, or provide care at home for, a person confirmed to have, or being evaluated for, COVID-19 infection please follow these guidelines to prevent infection:  Follow healthcare providers instructions Make sure that you understand and can help the patient follow any healthcare provider instructions for all care.  Provide for the patients basic needs You should help the patient with basic needs in the home and provide support for getting groceries, prescriptions, and other personal needs.  Monitor the patients symptoms If they are getting sicker, call his or her medical provider and tell them that the patient has, or is being evaluated for, COVID-19 infection. This will help the healthcare providers office  take steps to keep other people from getting infected. Ask the healthcare provider to call the local or state health department.  Limit the number of people who have contact with the patient  If possible, have only one caregiver for the  patient.  Other household members should stay in another home or place of residence. If this is not possible, they should stay  in another room, or be separated from the patient as much as possible. Use a separate bathroom, if available.  Restrict visitors who do not have an essential need to be in the home.  Keep older adults, very young children, and other sick people away from the patient Keep older adults, very young children, and those who have compromised immune systems or chronic health conditions away from the patient. This includes people with chronic heart, lung, or kidney conditions, diabetes, and cancer.  Ensure good ventilation Make sure that shared spaces in the home have good air flow, such as from an air conditioner or an opened window, weather permitting.  Wash your hands often  Wash your hands often and thoroughly with soap and water for at least 20 seconds. You can use an alcohol based hand sanitizer if soap and water are not available and if your hands are not visibly dirty.  Avoid touching your eyes, nose, and mouth with unwashed hands.  Use disposable paper towels to dry your hands. If not available, use dedicated cloth towels and replace them when they become wet.  Wear a facemask and gloves  Wear a disposable facemask at all times in the room and gloves when you touch or have contact with the patients blood, body fluids, and/or secretions or excretions, such as sweat, saliva, sputum, nasal mucus, vomit, urine, or feces.  Ensure the mask fits over your nose and mouth tightly, and do not touch it during use.  Throw out disposable facemasks and gloves after using them. Do not reuse.  Wash your hands immediately after removing your facemask and gloves.  If your personal clothing becomes contaminated, carefully remove clothing and launder. Wash your hands after handling contaminated clothing.  Place all used disposable facemasks, gloves, and other waste in a lined  container before disposing them with other household waste.  Remove gloves and wash your hands immediately after handling these items.  Do not share dishes, glasses, or other household items with the patient  Avoid sharing household items. You should not share dishes, drinking glasses, cups, eating utensils, towels, bedding, or other items with a patient who is confirmed to have, or being evaluated for, COVID-19 infection.  After the person uses these items, you should wash them thoroughly with soap and water.  Wash laundry thoroughly  Immediately remove and wash clothes or bedding that have blood, body fluids, and/or secretions or excretions, such as sweat, saliva, sputum, nasal mucus, vomit, urine, or feces, on them.  Wear gloves when handling laundry from the patient.  Read and follow directions on labels of laundry or clothing items and detergent. In general, wash and dry with the warmest temperatures recommended on the label.  Clean all areas the individual has used often  Clean all touchable surfaces, such as counters, tabletops, doorknobs, bathroom fixtures, toilets, phones, keyboards, tablets, and bedside tables, every day. Also, clean any surfaces that may have blood, body fluids, and/or secretions or excretions on them.  Wear gloves when cleaning surfaces the patient has come in contact with.  Use a diluted bleach solution (e.g., dilute bleach with 1 part  bleach and 10 parts water) or a household disinfectant with a label that says EPA-registered for coronaviruses. To make a bleach solution at home, add 1 tablespoon of bleach to 1 quart (4 cups) of water. For a larger supply, add  cup of bleach to 1 gallon (16 cups) of water.  Read labels of cleaning products and follow recommendations provided on product labels. Labels contain instructions for safe and effective use of the cleaning product including precautions you should take when applying the product, such as wearing gloves or  eye protection and making sure you have good ventilation during use of the product.  Remove gloves and wash hands immediately after cleaning.  Monitor yourself for signs and symptoms of illness Caregivers and household members are considered close contacts, should monitor their health, and will be asked to limit movement outside of the home to the extent possible. Follow the monitoring steps for close contacts listed on the symptom monitoring form.   ? If you have additional questions, contact your local health department or call the epidemiologist on call at [phone] (available 24/7). ? This guidance is subject to change. For the most up-to-date guidance from [facility], please refer to their website: [email or web address]

## 2018-11-24 NOTE — Discharge Summary (Signed)
Kara Santiago Vorhees, is a 85 y.o. female  DOB 1925/06/07  MRN [id number].  Admission date:  11/20/2018  Admitting Physician  John GiovanniVasundhra Rathore, MD  Discharge Date:  11/24/2018   Primary MD  Stefanie LibelNifong, Ted, MD  Recommendations for primary care physician for things to follow:  - please check CBC, CMP, during next visit   Admission Diagnosis  Hypoxia [R09.02] Pneumonia due to COVID-19 virus [U07.1, J12.89]   Discharge Diagnosis  Hypoxia [R09.02] Pneumonia due to COVID-19 virus [U07.1, J12.89]    Principal Problem:   Acute respiratory failure with hypoxia (HCC) Active Problems:   HYPERTENSION, BENIGN   Pneumonia due to COVID-19 virus   Hyponatremia   Hypokalemia   Acute respiratory disease due to COVID-19 virus      Past Medical History:  Diagnosis Date   Aortic insufficiency    Arthritis    told that she has OA- pt. reports that she doesn't have any pain from it but she reports that she has back pain from time to time    CAD (coronary artery disease)    a. 09/2014 Cath: patent LAD/LCX stents, otw nonobs dzs.   Carotid artery occlusion    CHF (congestive heart failure) (HCC)    CVD (cerebrovascular disease)    Diabetes mellitus type 2, controlled (HCC)    GERD (gastroesophageal reflux disease)    History of hiatal hernia    Hyperlipidemia    Hypertension    Mitral regurgitation    PVD (peripheral vascular disease) (HCC)    Severe aortic stenosis    a. 09/2014 Echo: EF 40%, gr2 DD, sev AS (mean 34 mmHg, peak 57 mmHg), mod MR, mild LAE, nl RV, mild TR, triv PI, PASP 41 mmHg;  b. 11/2014 TAVR: Randa EvensEdwards Sapien 3 THV (size 23mm) via L femoral cutdown;  c. 11/2014 postop Echo: EF 45-50%, no rwma, Gr 1 DD, minimally increased tranvsvalvular velocity, no AI/AS, mild MR, mildly dil LA, PASP 41mmHg.   Shortness of breath dyspnea     Past Surgical History:  Procedure Laterality Date    ABDOMINAL HYSTERECTOMY     vaginal approach, partial    CARDIAC CATHETERIZATION  04/07/07   By Dr. Excell Seltzerooper -- 1. Severe LAD stenosis with successful PCI using a drug-eluting stent. 2. Severe left circumflex stenosis with successful PCI using a single  drug-eluting stent.   CARDIAC CATHETERIZATION N/A 10/07/2014   Procedure: Right/Left Heart Cath and Coronary Angiography;  Surgeon: Tonny BollmanMichael Cooper, MD;  [facility];  Service: Cardiovascular;  Laterality: N/A;   EYE SURGERY     bilateral cataracts removed & /w IOL   stents  2008   TEE WITHOUT CARDIOVERSION N/A 11/26/2014   Procedure: TRANSESOPHAGEAL ECHOCARDIOGRAM (TEE);  Surgeon: Tonny BollmanMichael Cooper, MD;  [facility];  Service: Open Heart Surgery;  Laterality: N/A;   TRANSCATHETER AORTIC VALVE REPLACEMENT, TRANSFEMORAL N/A 11/26/2014   Procedure: TRANSCATHETER AORTIC VALVE REPLACEMENT, TRANSFEMORAL;  Surgeon: Tonny BollmanMichael Cooper, MD;  [facility];  Service: Open Heart Surgery;  Laterality: N/A;  TYMPANOMASTOIDECTOMY  2002   right       History of present illness and  Hospital Course:     Kindly see H&P for history of present illness and admission details, please review complete Labs, Consult reports and Test reports for all details in brief  HPI  from the history and physical done on the day of admission 11/20/2018  HPI: Kara Santiago Holdren is a 85 y.o. female with medical history significant of CAD status post PCI, CHF, CVD, type 2 diabetes, hypertension, hyperlipidemia, GERD, PVD, moderate mitral regurgitation, severe aortic stenosis status TAVR in July 2016 presenting to the hospital for evaluation of shortness of breath, cough, and fever.  Patient reports 4-day history of shortness of breath, productive cough, and fevers.  States her chest feels sore after coughing, no chest pain otherwise.  States she has not been eating or drinking much for the past few days.  No abdominal pain, vomiting, or diarrhea.  Denies any recent  known sick contacts.  ED Course: COVID-19 rapid test positive.  SPO2 88% on room air with ambulation and placed on 2 L supplemental oxygen.  Temperature 101.9 F.  No tachycardia or tachypnea.  No hypotension.  No leukocytosis.  Lactic acid normal x2.  Sodium 133, potassium 3.3.  High-sensitivity troponin 9 >9.  EKG without acute ischemic changes.  D-dimer 1.26.  Procalcitonin <0.10.  LDH 293.  Ferritin within normal range.  Fibrinogen 648.  CRP 10.7.  LFTs normal.  Blood culture x2 pending.  Chest x-ray showing patchy indistinct opacity in the left midlung suspicious for acute infection such as bronchopneumonia, viral versus atypical etiology.  Patient received a 500 cc fluid bolus, Tylenol, magnesium oxide 200 mg, and K. Dur 40 mEq in the ED.  Hospital Course    Acute respiratory disease due to covid-19 pneumonia: - Felt to be at high risk of progression to ARDS based on age alone.  Initial chest x-ray done completely typical for COVID-19 for pneumonia, repeat chest x-ray this morning more typical with bilateral opacities/infiltrate . -  CRP trending down which is reassuring -Normal procalcitonin, no indication for IV antibiotics. -Remains with no oxygen requirement, far no indication for Remdesivir , - Maintain euvolemia/net negative. Euvolemic currently - Avoid NSAIDs - Goals of care were discussed, confirmed DNR/DNI. Prognosis is guarded.  -Was treated with IV Solu-Medrol during hospital stay, she will be discharged on another 5 days of oral Decadron .  T2DM:  -CBG'Santiago are controlled on insulin sliding scale during hospital stay.  CAD Santiago/p PCI: Chest pain is typical of pleuritic etiology. hs-Tn reassuring, ECG unchanged.  - Continue ASA, BB, statin  Chronic HFpEF: Euvolemic. LVEF 60-65% on Echo March 2020. - Continue medications as above  AS Santiago/p TAVR 2016: Normal transvalvular gradient noted on recent echocardiogram.   Hypokalemia: - Supplemented, recheck  Hyponatremia:    -Resolved with normal saline  HTN:  - Continue norvasc, metoprolol  HLD:  - Continue statin  EtOH use: -disussed with the patient, she denies heavy alcohol use, she reports she might drink 1 glass of wine every week, no evidance  of withdrawals,I have stopped CIWA protocol.   Discharge Condition:  stable   Follow UP  Follow-up Information    Nifong, Ted, MD Follow up in 1 week(Santiago).   Specialty: Family Medicine Contact information: [address] [place] [address] [phone]             Discharge Instructions  and  Discharge Medications    Discharge  Instructions    Discharge instructions   Complete by: As directed    Follow with Primary MD Stefanie LibelNifong, Ted, MD in 7 days   Get CBC, CMP,  checked  by Primary MD next visit.    Activity: As tolerated with Full fall precautions use walker/cane & assistance as needed   Disposition Home    Diet: Heart Healthy , Carb modified , with feeding assistance and aspiration precautions.  For Heart failure patients - Check your Weight same time everyday, if you gain over 2 pounds, or you develop in leg swelling, experience more shortness of breath or chest pain, call your Primary MD immediately. Follow Cardiac Low Salt Diet and 1.5 lit/day fluid restriction.   On your next visit with your primary care physician please Get Medicines reviewed and adjusted.   Please request your Prim.MD to go over all Hospital Tests and Procedure/Radiological results at the follow up, please get all Hospital records sent to your Prim MD by signing hospital release before you go home.   If you experience worsening of your admission symptoms, develop shortness of breath, life threatening emergency, suicidal or homicidal thoughts you must seek medical attention immediately by calling 911 or calling your MD immediately  if symptoms less severe.  You Must read complete instructions/literature along with all the possible adverse  reactions/side effects for all the Medicines you take and that have been prescribed to you. Take any new Medicines after you have completely understood and accpet all the possible adverse reactions/side effects.   Do not drive, operating heavy machinery, perform activities at heights, swimming or participation in water activities or provide baby sitting services if your were admitted for syncope or siezures until you have seen by Primary MD or a Neurologist and advised to do so again.  Do not drive when taking Pain medications.    Do not take more than prescribed Pain, Sleep and Anxiety Medications  Special Instructions: If you have smoked or chewed Tobacco  in the last 2 yrs please stop smoking, stop any regular Alcohol  and or any Recreational drug use.  Wear Seat belts while driving.   Please note  You were cared for by a hospitalist during your hospital stay. If you have any questions about your discharge medications or the care you received while you were in the hospital after you are discharged, you can call the unit and asked to speak with the hospitalist on call if the hospitalist that took care of you is not available. Once you are discharged, your primary care physician will handle any further medical issues. Please note that NO REFILLS for any discharge medications will be authorized once you are discharged, as it is imperative that you return to your primary care physician (or establish a relationship with a primary care physician if you do not have one) for your aftercare needs so that they can reassess your need for medications and monitor your lab values.   Increase activity slowly   Complete by: As directed      Allergies as of 11/24/2018      Reactions   Contrast Media [iodinated Diagnostic Agents] Rash   Blister'Santiago      Medication List    STOP taking these medications   ibuprofen 200 MG tablet Commonly known as: ADVIL     TAKE these medications   acetaminophen 325 MG  tablet Commonly known as: TYLENOL Take 2 tablets (650 mg total) by mouth every 6 (six) hours as needed for fever.  What changed:   medication strength  how much to take  reasons to take this   amLODipine 5 MG tablet Commonly known as: NORVASC TAKE 1 TABLET BY MOUTH EVERY DAY   aspirin 81 MG tablet Take 81 mg by mouth daily.   atorvastatin 80 MG tablet Commonly known as: LIPITOR TAKE 1 TABLET BY MOUTH EVERY DAY What changed: when to take this   dexamethasone 6 MG tablet Commonly known as: DECADRON Take 1 tablet (6 mg total) by mouth daily for 5 days. Please take 1 tablet daily for 5 days then stop Start taking on: November 25, 2018   famotidine 20 MG tablet Commonly known as: PEPCID Take 1 tablet (20 mg total) by mouth 2 (two) times daily for 7 days. Please take for 1 week   glipiZIDE 5 MG tablet Commonly known as: GLUCOTROL Take 5 mg by mouth daily.   guaiFENesin 600 MG 12 hr tablet Commonly known as: MUCINEX Take 1,200 mg by mouth 2 (two) times daily as needed for cough or to loosen phlegm.   lisinopril 40 MG tablet Commonly known as: ZESTRIL Take 1 tablet (40 mg total) by mouth daily.   metoprolol tartrate 25 MG tablet Commonly known as: LOPRESSOR Take 1 tablet (25 mg total) by mouth 2 (two) times daily. Please keep upcoming appt for further refills.         Diet and Activity recommendation: See Discharge Instructions above   Consults obtained -  none   Major procedures and Radiology Reports - PLEASE review detailed and final reports for all details, in brief -     Dg Chest 2 View  Result Date: 11/20/2018 CLINICAL DATA:  85 year old female with chest pain and shortness of breath EXAM: CHEST - 2 VIEW COMPARISON:  Chest CT 07/14/2015 and earlier. FINDINGS: Upright AP and lateral views. Sequelae of TAVR redemonstrated. Low lung volumes today. Asymmetric and indistinct opacity in the left mid lung (arrow). Pulmonary vascularity appears stable and within  normal limits. No pneumothorax, pleural effusion, or other confluent pulmonary opacity. Visualized tracheal air column is within normal limits. Chronic spinal compression fractures. Stable visualized osseous structures. Calcified aortic atherosclerosis. Negative visible bowel gas pattern. IMPRESSION: 1. Lower lung volumes with patchy indistinct opacity in the left mid lung. This is nonspecific but suspicious for acute infection such as bronchopneumonia. Consider viral/atypical etiologies. No pleural effusion. 2.  Aortic Atherosclerosis (ICD10-I70.0).  Prior TAVR. Electronically Signed   By: Genevie Ann M.D.   On: 11/20/2018 14:54   Dg Chest Port 1 View  Result Date: 11/22/2018 CLINICAL DATA:  Acute respiratory disease, COVID-19 EXAM: PORTABLE CHEST 1 VIEW COMPARISON:  Portable exam 0546 hours compared to 11/20/2018 FINDINGS: Normal heart size post TAVR and coronary stenting. Mediastinal contours and pulmonary vascularity normal. Atherosclerotic calcification aorta. Hazy infiltrates at the lung bases greater on LEFT, slightly increased particularly on RIGHT since previous study. Minimal infiltrate RIGHT upper lobe. No pleural effusion or pneumothorax. IMPRESSION: BILATERAL pulmonary infiltrates, slightly increased especially on RIGHT. Electronically Signed   By: Lavonia Dana M.D.   On: 11/22/2018 08:42    Micro Results   Recent Results (from the past 240 hour(Santiago))  Blood Culture (routine x 2)     Status: None (Preliminary result)   Collection Time: 11/20/18  3:31 PM   Specimen: BLOOD RIGHT WRIST  Result Value Ref Range Status   Specimen Description BLOOD RIGHT WRIST  Final   Special Requests   Final    BOTTLES DRAWN AEROBIC AND ANAEROBIC  Blood Culture adequate volume   Culture   Final    NO GROWTH 4 DAYS Performed at [facility], [address], [place], [place] [address]    Report Status PENDING  Incomplete  SARS Coronavirus 2 (CEPHEID- Performed in [facility] hospital lab), Hosp Order      Status: Abnormal   Collection Time: 11/20/18  3:33 PM   Specimen: Nasopharyngeal Swab  Result Value Ref Range Status   SARS Coronavirus 2 POSITIVE (A) NEGATIVE Final    Comment: RESULT CALLED TO, READ BACK BY AND VERIFIED WITH: Gracy Bruins RN 17:15 11/20/18 ([email or web address]) (NOTE) If result is NEGATIVE SARS-CoV-2 target nucleic acids are NOT DETECTED. The SARS-CoV-2 RNA is generally detectable in upper and lower  respiratory specimens during the acute phase of infection. The lowest  concentration of SARS-CoV-2 viral copies this assay can detect is 250  copies / mL. A negative result does not preclude SARS-CoV-2 infection  and should not be used as the sole basis for treatment or other  patient management decisions.  A negative result may occur with  improper specimen collection / handling, submission of specimen other  than nasopharyngeal swab, presence of viral mutation(Santiago) within the  areas targeted by this assay, and inadequate number of viral copies  (<250 copies / mL). A negative result must be combined with clinical  observations, patient history, and epidemiological information. If result is POSITIVE SARS-CoV-2 target nucleic acids are DETECTED. T he SARS-CoV-2 RNA is generally detectable in upper and lower  respiratory specimens during the acute phase of infection.  Positive  results are indicative of active infection with SARS-CoV-2.  Clinical  correlation with patient history and other diagnostic information is  necessary to determine patient infection status.  Positive results do  not rule out bacterial infection or co-infection with other viruses. If result is PRESUMPTIVE POSTIVE SARS-CoV-2 nucleic acids MAY BE PRESENT.   A presumptive positive result was obtained on the submitted specimen  and confirmed on repeat testing.  While 2019 novel coronavirus  (SARS-CoV-2) nucleic acids may be present in the submitted sample  additional confirmatory testing may be necessary for  epidemiological  and / or clinical management purposes  to differentiate between  SARS-CoV-2 and other Sarbecovirus currently known to infect humans.  If clinically indicated additional testing with an alternate test  methodology [phone]) is  advised. The SARS-CoV-2 RNA is generally  detectable in upper and lower respiratory specimens during the acute  phase of infection. The expected result is Negative. Fact Sheet for Patients:  BoilerBrush.com.cy Fact Sheet for Healthcare Providers: https://pope.com/ This test is not yet approved or cleared by the [place] FDA and has been authorized for detection and/or diagnosis of SARS-CoV-2 by FDA under an Emergency Use Authorization (EUA).  This EUA will remain in effect (meaning this test can be used) for the duration of the COVID-19 declaration under Section 564(b)(1) of the Act, 21 [place].C. section 360bbb-3(b)(1), unless the authorization is terminated or revoked sooner. Performed at [facility], [address] [place], [place] [address]   Blood Culture (routine x 2)     Status: None (Preliminary result)   Collection Time: 11/20/18  3:40 PM   Specimen: BLOOD  Result Value Ref Range Status   Specimen Description BLOOD RIGHT ANTECUBITAL  Final   Special Requests   Final    BOTTLES DRAWN AEROBIC AND ANAEROBIC Blood Culture adequate volume   Culture   Final    NO GROWTH 4 DAYS Performed  at [facility], [address] [place], [place] [address]    Report Status PENDING  Incomplete       Today   Subjective:   Floretta Petro today has no headache,no chest or abdominal pain, she is excited about going home today  Objective:   Blood pressure (!) 153/76, pulse (!) 59, temperature 97.8 F (36.6 C), temperature source Oral, resp. rate (!) 22, height  (1.499 m), weight 46.2 kg, SpO2 96 %.   Intake/Output Summary (Last 24 hours) at 11/24/2018 1354 Last data filed  at 11/23/2018 2000 Gross per 24 hour  Intake 360 ml  Output --  Net 360 ml    Exam Awake Alert, Oriented x 3, No new F.N deficits, Normal affect, frail Symmetrical Chest wall movement, Good air movement bilaterally, CTAB RRR,No Gallops,Rubs or new Murmurs, No Parasternal Heave +ve B.Sounds, Abd Soft, Non tender,No rebound -guarding or rigidity. No Cyanosis, Clubbing or edema, No new Rash or bruise  Data Review   CBC w Diff:  Lab Results  Component Value Date   WBC 6.1 11/24/2018   HGB 13.3 11/24/2018   HCT 39.4 11/24/2018   PLT 404 (H) 11/24/2018   LYMPHOPCT 11 11/24/2018   MONOPCT 11 11/24/2018   EOSPCT 0 11/24/2018   BASOPCT 0 11/24/2018    CMP:  Lab Results  Component Value Date   NA 135 11/24/2018   NA 142 07/28/2018   K 3.9 11/24/2018   CL 96 (L) 11/24/2018   CO2 28 11/24/2018   BUN 17 11/24/2018   BUN 16 07/28/2018   CREATININE 0.55 11/24/2018   CREATININE 0.81 07/07/2015   PROT 6.6 11/24/2018   PROT 7.2 07/28/2018   ALBUMIN 2.9 (L) 11/24/2018   ALBUMIN 4.3 07/28/2018   BILITOT 0.3 11/24/2018   BILITOT 0.3 07/28/2018   ALKPHOS 70 11/24/2018   AST 30 11/24/2018   ALT 26 11/24/2018  .   Total Time in preparing paper work, data evaluation and todays exam - 35 minutes  Huey Bienenstock M.D on 11/24/2018 at 1:54 PM  [facility]   Office  [phone]

## 2018-11-24 NOTE — Care Management Important Message (Signed)
Important Message  Patient Details  Name: Kara Santiago MRN: [id number] Date of Birth: 13-Mar-1926   Medicare Important Message Given:  Yes - Important Message mailed due to current National Emergency    Verbal consent obtained due to current National Emergency  Relationship to patient: Other Realative (must comment) Contact Name: [name] Call Date: 11/24/18  Time: 1520 Phone: [phone] Outcome: Spoke with contact Important Message mailed to: Patient address on file    Baruch [address] 11/24/2018, 3:21 PM

## 2018-11-24 NOTE — Progress Notes (Signed)
Patient's daughter Enid Derry was called and updated on patient status. Daughter is aware that patient should be getting discharged today, and that we will call her when we have a set time for her to come and get her mother. Daughter was educated on practicing self-care such as hand hygiene and wearing a mask when in contact with the patient.

## 2018-11-24 NOTE — Progress Notes (Signed)
Patient's daughter will be arriving at 1830 to pick patient up for discharge! Instructions for discharge were reviewed with patient as well as daughter

## 2018-11-24 NOTE — TOC Transition Note (Signed)
Transition of Care [facility]) - CM/SW Discharge Note   Patient Details  Name: Kara Santiago MRN: [id number] Date of Birth: 01-29-1926  [facility] CM/SW Contact:  [name], RN Phone Number: [phone] (working remotely) 11/24/2018, 3:14 PM   Clinical Narrative:   85 yr old female admitted and treated for COVID 19. Thankfully, patient has improved enough to discharge home. Case manager contacted patient's [phone], [name] concerning discharge plan. Choice for [place] was offered, referral was called to Adela Lank, [facility] Liaison. Patient will have family support at discharge.  Final next level of care: [place]     Patient Goals and CMS Choice   CMS Medicare.gov Compare Post Acute Care list provided to:: Patient Represenative (must comment)(Verbally offered choice to patient's daughter [name] [phone]) Choice offered to / list presented to : Adult Children  Discharge Placement                       Discharge Plan and Services                DME Arranged: N/A         HH Arranged: PT   Date HH Agency Contacted: 11/24/18 Time HH Agency Contacted: 1500 Representative spoke with at [place]: Adela Lank  Social Determinants of Health ([employer]) Interventions     Readmission Risk Interventions No flowsheet data found.

## 2018-11-24 NOTE — Care Management (Signed)
ED CM received call from [facility] [place] on floor concerning patient being discharged tonight. CM reviewed record CM arranged [place] [facility] patient is ready for discharge. ED CM will also send message to [place] at [facility] to inform of late discharge.

## 2018-11-25 LAB — CULTURE, BLOOD (ROUTINE X 2)
Culture: NO GROWTH
Culture: NO GROWTH
Special Requests: ADEQUATE
Special Requests: ADEQUATE

## 2018-12-28 ENCOUNTER — Other Ambulatory Visit: Payer: Self-pay | Admitting: Cardiovascular Disease

## 2018-12-28 DIAGNOSIS — I10 Essential (primary) hypertension: Secondary | ICD-10-CM

## 2018-12-28 DIAGNOSIS — I359 Nonrheumatic aortic valve disorder, unspecified: Secondary | ICD-10-CM

## 2018-12-28 NOTE — Telephone Encounter (Signed)
Dr. [place] Breed is the primary cardiologist now. Please address

## 2019-05-15 ENCOUNTER — Other Ambulatory Visit: Payer: Self-pay | Admitting: Cardiology

## 2019-07-16 ENCOUNTER — Ambulatory Visit (HOSPITAL_COMMUNITY)
Admission: RE | Admit: 2019-07-16 | Discharge: 2019-07-16 | Disposition: A | Discharge status: Home / Self Care | Payer: Medicare Other | Source: Ambulatory Visit | Attending: Surgery | Admitting: Surgery

## 2019-07-16 ENCOUNTER — Other Ambulatory Visit: Payer: Self-pay

## 2019-07-16 ENCOUNTER — Ambulatory Visit (INDEPENDENT_AMBULATORY_CARE_PROVIDER_SITE_OTHER): Payer: Medicare Other | Admitting: Physician Assistant

## 2019-07-16 VITALS — BP 144/78 | HR 52 | Temp 97.3°F | Resp 14 | Ht 59.0 in | Wt 105.0 lb

## 2019-07-16 DIAGNOSIS — I6523 Occlusion and stenosis of bilateral carotid arteries: Secondary | ICD-10-CM | POA: Insufficient documentation

## 2019-07-16 NOTE — Progress Notes (Signed)
Office Note     CC:  follow up Requesting Provider:  Stefanie Libel, MD  HPI: Kara Santiago is a 86 y.o. (01-27-1926) female who presents for follow up for mild extracranial carotid artery stenosis. She is here today for her annual surveillance ultrasound.   She has no history of TIA or stroke symptoms. She has not had any changes in vision, facial drooping, slurred speech, weakness of upper or lower extremities.  She remains very active and lives independently.  She had COVID back in July 2020 and was hospitalized for 5 days but has since recovered well  The pt on a statin for cholesterol management.  The pt on a daily aspirin.   Other AC:  none The pt  on metoprolol and lisinopril for hypertension.   The pt is diabetic. She is not currently on any medications Tobacco hx:  Former smoker, quit in 2008  Past Medical History:  Diagnosis Date  . Aortic insufficiency   . Arthritis    told that she has OA- pt. reports that she doesn't have any pain from it but she reports that she has back pain from time to time   . CAD (coronary artery disease)    a. 09/2014 Cath: patent LAD/LCX stents, otw nonobs dzs.  . Carotid artery occlusion   . CHF (congestive heart failure) (HCC)   . CVD (cerebrovascular disease)   . Diabetes mellitus type 2, controlled (HCC)   . GERD (gastroesophageal reflux disease)   . History of hiatal hernia   . Hyperlipidemia   . Hypertension   . Mitral regurgitation   . PVD (peripheral vascular disease) (HCC)   . Severe aortic stenosis    a. 09/2014 Echo: EF 40%, gr2 DD, sev AS (mean 34 mmHg, peak 57 mmHg), mod MR, mild LAE, nl RV, mild TR, triv PI, PASP 41 mmHg;  b. 11/2014 TAVR: Randa Evens Sapien 3 THV (size 57mm) via L femoral cutdown;  c. 11/2014 postop Echo: EF 45-50%, no rwma, Gr 1 DD, minimally increased tranvsvalvular velocity, no AI/AS, mild MR, mildly dil LA, PASP .  Marland Kitchen Shortness of breath dyspnea     Past Surgical History:  Procedure Laterality Date   . ABDOMINAL HYSTERECTOMY     vaginal approach, partial   . CARDIAC CATHETERIZATION  04/07/07   By Dr. Excell Seltzer -- 1. Severe LAD stenosis with successful PCI using a drug-eluting stent. 2. Severe left circumflex stenosis with successful PCI using a single  drug-eluting stent.  Marland Kitchen CARDIAC CATHETERIZATION N/A 10/07/2014   Procedure: Right/Left Heart Cath and Coronary Angiography;  Surgeon: Tonny Bollman, MD;  Location: [facility] INVASIVE CV LAB;  Service: Cardiovascular;  Laterality: N/A;  . EYE SURGERY     bilateral cataracts removed & /w IOL  . stents  2008  . TEE WITHOUT CARDIOVERSION N/A 11/26/2014   Procedure: TRANSESOPHAGEAL ECHOCARDIOGRAM (TEE);  Surgeon: Tonny Bollman, MD;  Location: [facility] OR;  Service: Open Heart Surgery;  Laterality: N/A;  . TRANSCATHETER AORTIC VALVE REPLACEMENT, TRANSFEMORAL N/A 11/26/2014   Procedure: TRANSCATHETER AORTIC VALVE REPLACEMENT, TRANSFEMORAL;  Surgeon: Tonny Bollman, MD;  Location: [facility] OR;  Service: Open Heart Surgery;  Laterality: N/A;  . TYMPANOMASTOIDECTOMY  2002   right    Social History   Socioeconomic History  . Marital status: Widowed    Spouse name: Not on file  . Number of children: Not on file  . Years of education: Not on file  . Highest education level: Not on file  Occupational History  . 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 Smoking status: Former Smoker    Packs/day: 0.40    Years: 5.00    Pack years: 2.00    Types: Cigarettes    Quit date: 05/24/2006    Years since quitting: 13.1  . Smokeless tobacco: Never Used  Substance and Sexual Activity  . Alcohol use: Yes    Alcohol/week: 0.0 standard drinks    Comment: wine- Glass/ per day   . Drug use: No  . Sexual activity: Not on file  Other Topics Concern  . Not on file  Social History Narrative  . Not on file   Social Determinants of Health   Financial Resource Strain:   . Difficulty of Paying Living Expenses: Not on file  Food Insecurity:   . Worried  About Programme researcher, broadcasting/film/video in the Last Year: Not on file  . Ran Out of Food in the Last Year: Not on file  Transportation Needs:   . Lack of Transportation (Medical): Not on file  . Lack of Transportation (Non-Medical): Not on file  Physical Activity:   . Days of Exercise per Week: Not on file  . Minutes of Exercise per Session: Not on file  Stress:   . Feeling of Stress : Not on file  Social Connections:   . Frequency of Communication with Friends and Family: Not on file  . Frequency of Social Gatherings with Friends and Family: Not on file  . Attends Religious Services: Not on file  . Active Member of Clubs or Organizations: Not on file  . Attends Banker Meetings: Not on file  . Marital Status: Not on file  Intimate Partner Violence:   . Fear of Current or Ex-Partner: Not on file  . Emotionally Abused: Not on file  . Physically Abused: Not on file  . Sexually Abused: Not on file   Family History  Problem Relation Age of Onset  . Heart attack Mother   . Heart disease Mother        Before age 41  . Hyperlipidemia Mother   . Hypertension Mother   . Heart disease Sister        Before age 50  . Hyperlipidemia Sister   . Hypertension Sister   . Heart disease Brother        Before age 56  . Hyperlipidemia Brother   . Heart attack Brother   . Heart disease Brother        Before age 36-  Stents - CHF  . Pneumonia Other   . Heart disease Son 34       After age 46  . Heart attack Sister   . Heart attack Son   . Stroke Maternal Uncle     Current Outpatient Medications  Medication Sig Dispense Refill  . acetaminophen (TYLENOL) 325 MG tablet Take 2 tablets (650 mg total) by mouth every 6 (six) hours as needed for fever.    Marland Kitchen amLODipine (NORVASC) 5 MG tablet TAKE 1 TABLET BY MOUTH EVERY DAY (Patient taking differently: Take 5 mg by mouth daily. ) 90 tablet 3  . aspirin 81 MG tablet Take 81 mg by mouth daily.      Marland Kitchen atorvastatin (LIPITOR) 80 MG tablet TAKE 1 TABLET  BY MOUTH EVERY DAY 90 tablet 3  . lisinopril (PRINIVIL,ZESTRIL) 40 MG tablet Take 1 tablet (40 mg total) by mouth daily. 30 tablet 11  . metoprolol tartrate (LOPRESSOR) 25 MG tablet TAKE 1  TAB (25MG  TOTAL) BY MOUTH 2 (TWO) TIMES DAILY. PLEASE KEEP UPCOMING APPT FOR FURTHER REFILLS. 180 tablet 1  . famotidine (PEPCID) 20 MG tablet Take 1 tablet (20 mg total) by mouth 2 (two) times daily for 7 days. Please take for 1 week 14 tablet 0  . guaiFENesin (MUCINEX) 600 MG 12 hr tablet Take 1,200 mg by mouth 2 (two) times daily as needed for cough or to loosen phlegm.     No current facility-administered medications for this visit.    Allergies  Allergen Reactions  . Contrast Media [Iodinated Diagnostic Agents] Rash    Blister's  . Other Rash    Blister's     REVIEW OF SYSTEMS:  [X]  denotes positive finding, [ ]  denotes negative finding Cardiac  Comments:  Chest pain or chest pressure:    Shortness of breath upon exertion:    Short of breath when lying flat:    Irregular heart rhythm:        Vascular    Pain in calf, thigh, or hip brought on by ambulation:    Pain in feet at night that wakes you up from your sleep:     Blood clot in your veins:    Leg swelling:         Pulmonary    Oxygen at home:    Productive cough:     Wheezing:         Neurologic    Sudden weakness in arms or legs:     Sudden numbness in arms or legs:     Sudden onset of difficulty speaking or slurred speech:    Temporary loss of vision in one eye:     Problems with dizziness:         Gastrointestinal    Blood in stool:     Vomited blood:         Genitourinary    Burning when urinating:     Blood in urine:        Psychiatric    Major depression:         Hematologic    Bleeding problems:    Problems with blood clotting too easily:        Skin    Rashes or ulcers:        Constitutional    Fever or chills:      PHYSICAL EXAMINATION:  Vitals:   07/16/19 0934 07/16/19 0938  BP: 140/60 (!)  144/78  Pulse: (!) 52 (!) 52  Resp: 14   Temp: (!) 97.3 F (36.3 C)   TempSrc: Temporal   SpO2: 98%   Weight: 105 lb (47.6 kg)   Height: 4\' 11"  (1.499 m)     General:  Elderly, pleasant, well nourished Gait: Normal HENT: WNL, normocephalic Pulmonary: normal non-labored breathing , without Rales, rhonchi,  wheezing Cardiac: regular HR, without  Murmurs without carotid bruit Abdomen: soft, NT, no masses, normal bowel sounds, no palpable AAA Skin: without rashes Vascular Exam/Pulses:  Right Left  Radial 2+ (normal) 2+ (normal)  Ulnar 2+ (normal) 2+ (normal)  Femoral 2+ (normal) 2+ (normal)  Popliteal 2+ (normal) 2+ (normal)  DP 2+ (normal) 2+ (normal)  PT 1+ (weak) 1+ (weak)   Extremities: without ischemic changes, without Gangrene , without cellulitis; without open wounds; trophic nail changes bilaterally Musculoskeletal: no muscle wasting or atrophy  Neurologic: A&O X 3;  No focal weakness or paresthesias are detected Psychiatric:  The pt has Normal affect.   Non-Invasive Vascular Imaging:  07/16/19: Bilateral carotid duplex Right carotid with velocities consistent with 1-39% stenosis. The ECA remains >50% stenosed.  Left carotid with velocities consistent with 40-59% stenosis. Bilateral vertebral arteries demonstrate antegrade flow. Left subclavian artery stenosis, right normal flow   ASSESSMENT/PLAN:: 86 y.o. female here for follow up for bilateral extracranial carotid stenosis. She remains asymptomatic. Carotid duplex stable from last year. Spoke with her daughter on the phone and she did not express any concerns regarding her mother and feels that she is doing very well  - she will continue her Aspirin and statin - discussed strict blood pressure control - Advised her if she develops and signs and symptoms of TIA/Stoke she should present immediately to the emergency room - she will follow up in 1 year with a carotid duplex   Graceann Congress, PA-C [facility] [phone]  Clinic MD:  Dr. Myra Gianotti

## 2019-07-17 ENCOUNTER — Other Ambulatory Visit: Payer: Self-pay | Admitting: *Deleted

## 2019-07-17 DIAGNOSIS — I6523 Occlusion and stenosis of bilateral carotid arteries: Secondary | ICD-10-CM

## 2019-07-24 NOTE — Progress Notes (Signed)
HPI: Follow-up coronary disease and aortic valve replacement. Patient had cardiac catheterization May 2016 that showed patent stents in the LAD and circumflex and no other obstructive disease. Ejection fraction 50%. Moderate mitral regurgitation and severe aortic stenosis. She underwent TAVR 7/16. She also has cerebrovascular disease followed by vascular surgery.Echo March 2020 showed normal LV systolic function, grade 1 diastolic dysfunction, moderate left atrial enlargement, mean gradient across aortic valve 8 mmHg with no aortic insufficiency, moderate mitral regurgitation.  Carotid Dopplers February 2021 showed 1 to 39% right and 40 to 59% left stenosis.  There was left subclavian stenosis.  Since last seen,the patient denies any dyspnea on exertion, orthopnea, PND, pedal edema, palpitations, syncope or chest pain.   Current Outpatient Medications  Medication Sig Dispense Refill  . acetaminophen (TYLENOL) 325 MG tablet Take 2 tablets (650 mg total) by mouth every 6 (six) hours as needed for fever.    Marland Kitchen amLODipine (NORVASC) 5 MG tablet TAKE 1 TABLET BY MOUTH EVERY DAY (Patient taking differently: Take 5 mg by mouth daily. ) 90 tablet 3  . aspirin 81 MG tablet Take 81 mg by mouth daily.      . Aspirin-Calcium Carbonate 81-777 MG TABS Take by mouth.    Marland Kitchen atorvastatin (LIPITOR) 80 MG tablet TAKE 1 TABLET BY MOUTH EVERY DAY 90 tablet 3  . guaiFENesin (MUCINEX) 600 MG 12 hr tablet Take 1,200 mg by mouth 2 (two) times daily as needed for cough or to loosen phlegm.    Marland Kitchen lisinopril (PRINIVIL,ZESTRIL) 40 MG tablet Take 1 tablet (40 mg total) by mouth daily. 30 tablet 11  . metoprolol tartrate (LOPRESSOR) 25 MG tablet TAKE 1 TAB (25MG  TOTAL) BY MOUTH 2 (TWO) TIMES DAILY. PLEASE KEEP UPCOMING APPT FOR FURTHER REFILLS. 180 tablet 1  . famotidine (PEPCID) 20 MG tablet Take 1 tablet (20 mg total) by mouth 2 (two) times daily for 7 days. Please take for 1 week 14 tablet 0   No current  facility-administered medications for this visit.     Past Medical History:  Diagnosis Date  . Aortic insufficiency   . Arthritis    told that she has OA- pt. reports that she doesn't have any pain from it but she reports that she has back pain from time to time   . CAD (coronary artery disease)    a. 09/2014 Cath: patent LAD/LCX stents, otw nonobs dzs.  . Carotid artery occlusion   . CHF (congestive heart failure) (HCC)   . CVD (cerebrovascular disease)   . Diabetes mellitus type 2, controlled (HCC)   . GERD (gastroesophageal reflux disease)   . History of hiatal hernia   . Hyperlipidemia   . Hypertension   . Mitral regurgitation   . PVD (peripheral vascular disease) (HCC)   . Severe aortic stenosis    a. 09/2014 Echo: EF 40%, gr2 DD, sev AS (mean 34 mmHg, peak 57 mmHg), mod MR, mild LAE, nl RV, mild TR, triv PI, PASP 41 mmHg;  b. 11/2014 TAVR: 12/2014 Sapien 3 THV (size 70mm) via L femoral cutdown;  c. 11/2014 postop Echo: EF 45-50%, no rwma, Gr 1 DD, minimally increased tranvsvalvular velocity, no AI/AS, mild MR, mildly dil LA, PASP 12/2014.  Shortness of breath dyspnea     Past Surgical History:  Procedure Laterality Date  . ABDOMINAL HYSTERECTOMY     vaginal approach, partial   . CARDIAC CATHETERIZATION  04/07/07   By Dr. 04/09/07 -- 1. Severe LAD stenosis with  successful PCI using a drug-eluting stent. 2. Severe left circumflex stenosis with successful PCI using a single  drug-eluting stent.  Marland Kitchen CARDIAC CATHETERIZATION N/A 10/07/2014   Procedure: Right/Left Heart Cath and Coronary Angiography;  Surgeon: Sherren Mocha, MD;  Location: [facility];  Service: Cardiovascular;  Laterality: N/A;  . EYE SURGERY     bilateral cataracts removed & /w IOL  . stents  2008  . TEE WITHOUT CARDIOVERSION N/A 11/26/2014   Procedure: TRANSESOPHAGEAL ECHOCARDIOGRAM (TEE);  Surgeon: Sherren Mocha, MD;  Location: [facility];  Service: Open Heart Surgery;  Laterality: N/A;  . TRANSCATHETER AORTIC  VALVE REPLACEMENT, TRANSFEMORAL N/A 11/26/2014   Procedure: TRANSCATHETER AORTIC VALVE REPLACEMENT, TRANSFEMORAL;  Surgeon: Sherren Mocha, MD;  Location: [place];  Service: Open Heart Surgery;  Laterality: N/A;  . TYMPANOMASTOIDECTOMY  2002   right    Social History   Socioeconomic History  . Marital status: Widowed    Spouse name: Not on file  . Number of children: Not on file  . Years of education: Not on file  . Highest education level: Not on file  Occupational History  . 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 Smoking status: Former Smoker    Packs/day: 0.40    Years: 5.00    Pack years: 2.00    Types: Cigarettes    Quit date: 05/24/2006    Years since quitting: 13.2  . Smokeless tobacco: Never Used  Substance and Sexual Activity  . Alcohol use: Yes    Alcohol/week: 0.0 standard drinks    Comment: wine- Glass/ per day   . Drug use: No  . Sexual activity: Not on file  Other Topics Concern  . Not on file  Social History Narrative  . Not on file   Social Determinants of Health   Financial Resource Strain:   . Difficulty of Paying Living Expenses:   Food Insecurity:   . Worried About Charity fundraiser in the Last Year:   . Arboriculturist in the Last Year:   Transportation Needs:   . Film/video editor (Medical):   Marland Kitchen Lack of Transportation (Non-Medical):   Physical Activity:   . Days of Exercise per Week:   . Minutes of Exercise per Session:   Stress:   . Feeling of Stress :   Social Connections:   . Frequency of Communication with Friends and Family:   . Frequency of Social Gatherings with Friends and Family:   . Attends Religious Services:   . Active Member of Clubs or Organizations:   . Attends Archivist Meetings:   Marland Kitchen Marital Status:   Intimate Partner Violence:   . Fear of Current or Ex-Partner:   . Emotionally Abused:   Marland Kitchen Physically Abused:   . Sexually Abused:     Family History  Problem Relation Age of  Onset  . Heart attack Mother   . Heart disease Mother        Before age 77  . Hyperlipidemia Mother   . Hypertension Mother   . Heart disease Sister        Before age 88  . Hyperlipidemia Sister   . Hypertension Sister   . Heart disease Brother        Before age 86  . Hyperlipidemia Brother   . Heart attack Brother   . Heart disease Brother        Before age 38-  Stents - CHF  . Pneumonia  Other   . Heart disease Son 32       After age 41  . Heart attack Sister   . Heart attack Son   . Stroke Maternal Uncle     ROS: no fevers or chills, productive cough, hemoptysis, dysphasia, odynophagia, melena, hematochezia, dysuria, hematuria, rash, seizure activity, orthopnea, PND, pedal edema, claudication. Remaining systems are negative.  Physical Exam: Well-developed well-nourished in no acute distress.  Skin is warm and dry.  HEENT is normal.  Neck is supple.  Chest is clear to auscultation with normal expansion.  Cardiovascular exam is regular rate and rhythm.  1/6 systolic murmur left sternal border.  No diastolic murmur. Abdominal exam nontender or distended. No masses palpated. Extremities show no edema. neuro grossly intact  A/P  1 CAD-patient has not had chest pain; continue aspirin and statin.  2 prior aortic valve replacement-continue SBE prophylaxis.  Repeat echocardiogram.  3 hypertension-blood pressure controlled.  Continue present medical regimen.  Check potassium and renal function.  4 hyperlipidemia-continue statin.  Check lipids and liver.  5 carotid artery disease-continue aspirin and statin.  Olga Millers, MD

## 2019-07-31 ENCOUNTER — Telehealth: Payer: Self-pay | Admitting: Cardiology

## 2019-07-31 NOTE — Telephone Encounter (Signed)
Patient's daughter, [name], requesting to come with her mother to her appt on 08/02/19 with Dr. Jens Som. She states that due to age and her mom not hearing well, she will need to be present.

## 2019-07-31 NOTE — Telephone Encounter (Signed)
Spoke with patient's daughter. Daughter okay to accompany patient to appointment.

## 2019-08-02 ENCOUNTER — Encounter: Payer: Self-pay | Admitting: *Deleted

## 2019-08-02 ENCOUNTER — Ambulatory Visit (INDEPENDENT_AMBULATORY_CARE_PROVIDER_SITE_OTHER): Payer: Medicare Other | Admitting: Cardiology

## 2019-08-02 ENCOUNTER — Encounter: Payer: Self-pay | Admitting: Cardiology

## 2019-08-02 ENCOUNTER — Other Ambulatory Visit: Payer: Self-pay

## 2019-08-02 VITALS — BP 138/80 | HR 55 | Temp 97.3°F | Ht 59.0 in | Wt 107.0 lb

## 2019-08-02 DIAGNOSIS — I2510 Atherosclerotic heart disease of native coronary artery without angina pectoris: Secondary | ICD-10-CM

## 2019-08-02 DIAGNOSIS — I359 Nonrheumatic aortic valve disorder, unspecified: Secondary | ICD-10-CM | POA: Diagnosis not present

## 2019-08-02 DIAGNOSIS — I10 Essential (primary) hypertension: Secondary | ICD-10-CM

## 2019-08-02 DIAGNOSIS — E7800 Pure hypercholesterolemia, unspecified: Secondary | ICD-10-CM

## 2019-08-02 DIAGNOSIS — I6523 Occlusion and stenosis of bilateral carotid arteries: Secondary | ICD-10-CM | POA: Diagnosis not present

## 2019-08-02 LAB — COMPREHENSIVE METABOLIC PANEL
ALT: 13 IU/L (ref 0–32)
AST: 20 IU/L (ref 0–40)
Albumin/Globulin Ratio: 1.7 (ref 1.2–2.2)
Albumin: 4.3 g/dL (ref 3.5–4.6)
Alkaline Phosphatase: 110 IU/L (ref 39–117)
BUN/Creatinine Ratio: 16 (ref 12–28)
BUN: 13 mg/dL (ref 10–36)
Bilirubin Total: 0.4 mg/dL (ref 0.0–1.2)
CO2: 24 mmol/L (ref 20–29)
Calcium: 9.6 mg/dL (ref 8.7–10.3)
Chloride: 99 mmol/L (ref 96–106)
Creatinine, Ser: 0.79 mg/dL (ref 0.57–1.00)
GFR calc Af Amer: 76 mL/min/{1.73_m2} (ref >59)
GFR calc non Af Amer: 66 mL/min/{1.73_m2} (ref >59)
Globulin, Total: 2.6 g/dL (ref 1.5–4.5)
Glucose: 102 mg/dL — ABNORMAL HIGH (ref 65–99)
Potassium: 4.8 mmol/L (ref 3.5–5.2)
Sodium: 137 mmol/L (ref 134–144)
Total Protein: 6.9 g/dL (ref 6.0–8.5)

## 2019-08-02 LAB — LIPID PANEL
Chol/HDL Ratio: 3.2 ratio (ref 0.0–4.4)
Cholesterol, Total: 203 mg/dL — ABNORMAL HIGH (ref 100–199)
HDL: 64 mg/dL (ref >39)
LDL Chol Calc (NIH): 120 mg/dL — ABNORMAL HIGH (ref 0–99)
Triglycerides: 106 mg/dL (ref 0–149)
VLDL Cholesterol Cal: 19 mg/dL (ref 5–40)

## 2019-08-02 NOTE — Patient Instructions (Signed)
Medication Instructions:  NO CHANGE *If you need a refill on your cardiac medications before your next appointment, please call your pharmacy*   Lab Work: Your physician recommends that you HAVE LAB WORK TODAY If you have labs (blood work) drawn today and your tests are completely normal, you will receive your results only by: Marland Kitchen MyChart Message (if you have MyChart) OR . A paper copy in the mail If you have any lab test that is abnormal or we need to change your treatment, we will call you to review the results.   Testing/Procedures: Your physician has requested that you have an echocardiogram. Echocardiography is a painless test that uses sound waves to create images of your heart. It provides your doctor with information about the size and shape of your heart and how well your heart's chambers and valves are working. This procedure takes approximately one hour. There are no restrictions for this procedure.[address]  Follow-Up: At [facility], you and your health needs are our priority.  As part of our continuing mission to provide you with exceptional heart care, we have created designated Provider Care Teams.  These Care Teams include your primary Cardiologist (physician) and Advanced Practice Providers (APPs -  Physician Assistants and Nurse Practitioners) who all work together to provide you with the care you need, when you need it.  We recommend signing up for the patient portal called "MyChart".  Sign up information is provided on this After Visit Summary.  MyChart is used to connect with patients for Virtual Visits (Telemedicine).  Patients are able to view lab/test results, encounter notes, upcoming appointments, etc.  Non-urgent messages can be sent to your provider as well.   To learn more about what you can do with MyChart, go to [email or web address].    Your next appointment:   12 month(s)  The format for your next appointment:   Either In Person or  Virtual  Provider:   You may see Olga Millers, MD or one of the following Advanced Practice Providers on your designated Care Team:    Corine Shelter, PA-C  [place], [place]  Edd Fabian, [place]

## 2019-08-20 ENCOUNTER — Ambulatory Visit (HOSPITAL_BASED_OUTPATIENT_CLINIC_OR_DEPARTMENT_OTHER)
Admission: RE | Admit: 2019-08-20 | Discharge: 2019-08-20 | Disposition: A | Discharge status: Home / Self Care | Payer: Medicare Other | Source: Ambulatory Visit | Attending: Cardiology | Admitting: Cardiology

## 2019-08-20 ENCOUNTER — Other Ambulatory Visit: Payer: Self-pay

## 2019-08-20 DIAGNOSIS — I359 Nonrheumatic aortic valve disorder, unspecified: Secondary | ICD-10-CM | POA: Insufficient documentation

## 2019-08-20 NOTE — Progress Notes (Signed)
  Echocardiogram 2D Echocardiogram has been performed.  Kara Santiago 08/20/2019, 8:59 AM

## 2019-09-13 DIAGNOSIS — H65491 Other chronic nonsuppurative otitis media, right ear: Secondary | ICD-10-CM | POA: Insufficient documentation

## 2019-09-13 DIAGNOSIS — H7491 Unspecified disorder of right middle ear and mastoid: Secondary | ICD-10-CM | POA: Insufficient documentation

## 2019-09-13 DIAGNOSIS — H6981 Other specified disorders of Eustachian tube, right ear: Secondary | ICD-10-CM | POA: Insufficient documentation

## 2019-09-13 DIAGNOSIS — H918X9 Other specified hearing loss, unspecified ear: Secondary | ICD-10-CM | POA: Insufficient documentation

## 2019-10-08 ENCOUNTER — Other Ambulatory Visit: Payer: Self-pay | Admitting: Cardiology

## 2020-03-14 DIAGNOSIS — Z9089 Acquired absence of other organs: Secondary | ICD-10-CM | POA: Insufficient documentation

## 2020-08-15 ENCOUNTER — Other Ambulatory Visit: Payer: Self-pay | Admitting: *Deleted

## 2020-08-15 DIAGNOSIS — I6523 Occlusion and stenosis of bilateral carotid arteries: Secondary | ICD-10-CM

## 2020-08-19 ENCOUNTER — Other Ambulatory Visit: Payer: Self-pay

## 2020-08-19 ENCOUNTER — Ambulatory Visit (HOSPITAL_COMMUNITY)
Admission: RE | Admit: 2020-08-19 | Discharge: 2020-08-19 | Disposition: A | Discharge status: Home / Self Care | Payer: Medicare Other | Source: Ambulatory Visit | Attending: Vascular Surgery | Admitting: Vascular Surgery

## 2020-08-19 ENCOUNTER — Ambulatory Visit (INDEPENDENT_AMBULATORY_CARE_PROVIDER_SITE_OTHER): Payer: Medicare Other | Admitting: Physician Assistant

## 2020-08-19 VITALS — BP 160/59 | HR 60 | Temp 97.7°F | Resp 20 | Ht 59.0 in | Wt 93.9 lb

## 2020-08-19 DIAGNOSIS — I6523 Occlusion and stenosis of bilateral carotid arteries: Secondary | ICD-10-CM | POA: Diagnosis not present

## 2020-08-19 NOTE — Progress Notes (Signed)
Office Note     CC:  follow up Requesting Provider:  Stefanie Libel, MD  HPI: Kara Santiago is a 87 y.o. ([birth year]) female who presents for her annual follow up for carotid stenosis. She has had stable bilateral extracranial carotid stenosis of 40-59% that we have been following since 2015. She has no history of TIA or stroke symptoms. She has not had any changes in vision, facial drooping, slurred speech, weakness of upper or lower extremities. She does says she has some "brain fog" from time to time. She also reports a "catch" in her neck on the left side when she turns certain ways which is very painful.  She remains very active and lives independently. She continues to drive. She reports no new medical issues since her last visit.   The pt on a statin for cholesterol management.  The pt on a daily aspirin.   Other AC:  none The pt  on metoprolol and lisinopril for hypertension.   The pt is diabetic. She is not currently on any medications Tobacco hx:  Former smoker, quit in 2008  Past Medical History:  Diagnosis Date  . Aortic insufficiency   . Arthritis    told that she has OA- pt. reports that she doesn't have any pain from it but she reports that she has back pain from time to time   . CAD (coronary artery disease)    a. 09/2014 Cath: patent LAD/LCX stents, otw nonobs dzs.  . Carotid artery occlusion   . CHF (congestive heart failure) (HCC)   . CVD (cerebrovascular disease)   . Diabetes mellitus type 2, controlled (HCC)   . GERD (gastroesophageal reflux disease)   . History of hiatal hernia   . Hyperlipidemia   . Hypertension   . Mitral regurgitation   . PVD (peripheral vascular disease) (HCC)   . Severe aortic stenosis    a. 09/2014 Echo: EF 40%, gr2 DD, sev AS (mean 34 mmHg, peak 57 mmHg), mod MR, mild LAE, nl RV, mild TR, triv PI, PASP 41 mmHg;  b. 11/2014 TAVR: Randa Evens Sapien 3 THV (size 41mm) via L femoral cutdown;  c. 11/2014 postop Echo: EF 45-50%, no rwma, Gr 1  DD, minimally increased tranvsvalvular velocity, no AI/AS, mild MR, mildly dil LA, PASP .  Marland Kitchen Shortness of breath dyspnea     Past Surgical History:  Procedure Laterality Date  . ABDOMINAL HYSTERECTOMY     vaginal approach, partial   . CARDIAC CATHETERIZATION  04/07/07   By Dr. Excell Seltzer -- 1. Severe LAD stenosis with successful PCI using a drug-eluting stent. 2. Severe left circumflex stenosis with successful PCI using a single  drug-eluting stent.  Marland Kitchen CARDIAC CATHETERIZATION N/A 10/07/2014   Procedure: Right/Left Heart Cath and Coronary Angiography;  Surgeon: Tonny Bollman, MD;  Location: [facility] INVASIVE CV LAB;  Service: Cardiovascular;  Laterality: N/A;  . EYE SURGERY     bilateral cataracts removed & /w IOL  . stents  2008  . TEE WITHOUT CARDIOVERSION N/A 11/26/2014   Procedure: TRANSESOPHAGEAL ECHOCARDIOGRAM (TEE);  Surgeon: Tonny Bollman, MD;  [facility];  Service: Open Heart Surgery;  Laterality: N/A;  . TRANSCATHETER AORTIC VALVE REPLACEMENT, TRANSFEMORAL N/A 11/26/2014   Procedure: TRANSCATHETER AORTIC VALVE REPLACEMENT, TRANSFEMORAL;  Surgeon: Tonny Bollman, MD;  Location: [facility] OR;  Service: Open Heart Surgery;  Laterality: N/A;  . TYMPANOMASTOIDECTOMY  2002   right    Social History   Socioeconomic History  . Marital status: Widowed    Spouse  name: Not on file  . Number of children: Not on file  . Years of education: Not on file  . Highest education level: Not on file  Occupational History  . 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 Smoking status: Former Smoker    Packs/day: 0.40    Years: 5.00    Pack years: 2.00    Types: Cigarettes    Quit date: 05/24/2006    Years since quitting: 14.2  . Smokeless tobacco: Never Used  Substance and Sexual Activity  . Alcohol use: Yes    Alcohol/week: 0.0 standard drinks    Comment: wine- Glass/ per day   . Drug use: No  . Sexual activity: Not on file  Other Topics Concern  . Not on file  Social  History Narrative  . Not on file   Social Determinants of Health   Financial Resource Strain: Not on file  Food Insecurity: Not on file  Transportation Needs: Not on file  Physical Activity: Not on file  Stress: Not on file  Social Connections: Not on file  Intimate Partner Violence: Not on file    Family History  Problem Relation Age of Onset  . Heart attack Mother   . Heart disease Mother        Before age 50  . Hyperlipidemia Mother   . Hypertension Mother   . Heart disease Sister        Before age 76  . Hyperlipidemia Sister   . Hypertension Sister   . Heart disease Brother        Before age 52  . Hyperlipidemia Brother   . Heart attack Brother   . Heart disease Brother        Before age 96-  Stents - CHF  . Pneumonia Other   . Heart disease Son 80       After age 7  . Heart attack Sister   . Heart attack Son   . Stroke Maternal Uncle     Current Outpatient Medications  Medication Sig Dispense Refill  . acetaminophen (TYLENOL) 325 MG tablet Take 2 tablets (650 mg total) by mouth every 6 (six) hours as needed for fever.    Marland Kitchen amLODipine (NORVASC) 5 MG tablet TAKE 1 TABLET BY MOUTH EVERY DAY 90 tablet 3  . aspirin 81 MG tablet Take 81 mg by mouth daily.    . Aspirin-Calcium Carbonate 81-777 MG TABS Take by mouth.    Marland Kitchen atorvastatin (LIPITOR) 80 MG tablet TAKE 1 TABLET BY MOUTH EVERY DAY 90 tablet 3  . guaiFENesin (MUCINEX) 600 MG 12 hr tablet Take 1,200 mg by mouth 2 (two) times daily as needed for cough or to loosen phlegm.    Marland Kitchen lisinopril (ZESTRIL) 40 MG tablet TAKE 1 TABLET BY MOUTH EVERY DAY 90 tablet 3  . metoprolol tartrate (LOPRESSOR) 25 MG tablet TAKE 1 TAB (25MG  TOTAL) BY MOUTH 2 (TWO) TIMES DAILY. PLEASE KEEP UPCOMING APPT FOR FURTHER REFILLS. 180 tablet 1  . famotidine (PEPCID) 20 MG tablet Take 1 tablet (20 mg total) by mouth 2 (two) times daily for 7 days. Please take for 1 week 14 tablet 0   No current facility-administered medications for this visit.     Allergies  Allergen Reactions  . Contrast Media [Iodinated Diagnostic Agents] Rash    Blister's  . Other Rash    Blister's     REVIEW OF SYSTEMS:  [X]  denotes positive finding, [ ]  denotes negative finding  Cardiac  Comments:  Chest pain or chest pressure:    Shortness of breath upon exertion: X   Short of breath when lying flat:    Irregular heart rhythm:        Vascular    Pain in calf, thigh, or hip brought on by ambulation:    Pain in feet at night that wakes you up from your sleep:     Blood clot in your veins:    Leg swelling:         Pulmonary    Oxygen at home:    Productive cough:     Wheezing:         Neurologic    Sudden weakness in arms or legs:     Sudden numbness in arms or legs:     Sudden onset of difficulty speaking or slurred speech:    Temporary loss of vision in one eye:     Problems with dizziness:         Gastrointestinal    Blood in stool:     Vomited blood:         Genitourinary    Burning when urinating:     Blood in urine:        Psychiatric    Major depression:         Hematologic    Bleeding problems:    Problems with blood clotting too easily:        Skin    Rashes or ulcers:        Constitutional    Fever or chills:      PHYSICAL EXAMINATION:  Vitals:   08/19/20 1140 08/19/20 1142  BP: (!) 156/62 (!) 160/59  Pulse: 60   Resp: 20   Temp: 97.7 F (36.5 C)   TempSrc: Temporal   SpO2: 100%   Weight: 93 lb 14.4 oz (42.6 kg)   Height: 4\' 11"  (1.499 m)     General:  WDWN in NAD; vital signs documented above Gait: Normal HENT: WNL, normocephalic Pulmonary: normal non-labored breathing , without wheezing Cardiac: regular HR, without  Murmurs with carotid bruit bilaterally Abdomen: soft, NT, no masses Skin: with rashes Vascular Exam/Pulses:  Right Left  Radial 2+ (normal) 2+ (normal)  Femoral 2+ (normal) 2+ (normal)  Popliteal 2+ (normal) 2+ (normal)  DP 2+ (normal) 2+ (normal)  PT 2+ (normal) 2+ (normal)    Extremities: without ischemic changes, without Gangrene , without cellulitis; without open wounds;  Musculoskeletal: no muscle wasting or atrophy  Neurologic: A&O X 3;  No focal weakness or paresthesias are detected Psychiatric:  The pt has Normal affect.   Non-Invasive Vascular Imaging:   Right Carotid: Velocities in the right ICA are consistent with a 40-59% stenosis. The ECA appears >50% stenosed.   Left Carotid: Velocities in the left ICA are consistent with a 40-59% stenosis.   Vertebrals: Bilateral vertebral arteries demonstrate antegrade flow.  Subclavians: Left subclavian artery was stenotic. Normal flow hemodynamics were seen in the right subclavian artery.   ASSESSMENT/PLAN:: 87 y.o. female here for follow up for extracranial carotid artery stenosis. Her carotids have had 40-59% stenosis since we started following her in 2015. She remains asymptomatic. Her duplex today shows continued 40-59% stenosis bilaterally - discussed extending out her follow up with stable disease especially since she thinks she would not ever want surgery however she would like to keep her annual follow up - she will continue her aspirin and statin - again discussed adequate blood pressure control.  BP elevated today but she says she did not take her medication this morning. Discussed importance of good blood pressure control with her - Advised her if she develops any signs and symptoms of TIA/Stoke she should present immediately to the emergency room -She would like to follow up in 1 year with repeat carotid duplex   Graceann Congressorrina Beena Catano, PA-C [facility] [phone]  Clinic MD:  Steve RattlerHawken/ [facility]

## 2020-08-26 ENCOUNTER — Ambulatory Visit: Payer: Medicare Other | Admitting: Cardiology

## 2020-09-24 NOTE — Progress Notes (Signed)
HPI: Follow-up coronary disease and aortic valve replacement. Patient had cardiac catheterization May 2016that showed patent stents in the LAD and circumflex and no other obstructive disease. Ejection fraction 50%. Moderate mitral regurgitation and severe aortic stenosis. She underwent TAVR 7/16. She also has cerebrovascular disease followed by vascular surgery. Echocardiogram March 2021 showed normal LV function, grade 1 diastolic dysfunction, mild to moderate left atrial enlargement, moderate mitral regurgitation, TAVR with no aortic insufficiency.  Carotid Dopplers March 2022 showed 40 to 59% bilateral stenosis.  There was left subclavian stenosis.  Since last seen,she notes increased dyspnea on exertion.  There is no orthopnea, PND, pedal edema, chest pain, palpitations or syncope.  Current Outpatient Medications  Medication Sig Dispense Refill  . acetaminophen (TYLENOL) 325 MG tablet Take 2 tablets (650 mg total) by mouth every 6 (six) hours as needed for fever.    Marland Kitchen amLODipine (NORVASC) 5 MG tablet TAKE 1 TABLET BY MOUTH EVERY DAY 90 tablet 3  . aspirin 81 MG tablet Take 81 mg by mouth daily.    Marland Kitchen atorvastatin (LIPITOR) 80 MG tablet TAKE 1 TABLET BY MOUTH EVERY DAY 90 tablet 3  . lisinopril (ZESTRIL) 40 MG tablet TAKE 1 TABLET BY MOUTH EVERY DAY 90 tablet 3  . metoprolol tartrate (LOPRESSOR) 25 MG tablet TAKE 1 TAB (25MG  TOTAL) BY MOUTH 2 (TWO) TIMES DAILY. PLEASE KEEP UPCOMING APPT FOR FURTHER REFILLS. 180 tablet 1  . Aspirin-Calcium Carbonate 81-777 MG TABS Take by mouth. (Patient not taking: Reported on 10/06/2020)    . guaiFENesin (MUCINEX) 600 MG 12 hr tablet Take 1,200 mg by mouth 2 (two) times daily as needed for cough or to loosen phlegm.     No current facility-administered medications for this visit.     Past Medical History:  Diagnosis Date  . Aortic insufficiency   . Arthritis    told that she has OA- pt. reports that she doesn't have any pain from it but she  reports that she has back pain from time to time   . CAD (coronary artery disease)    a. 09/2014 Cath: patent LAD/LCX stents, otw nonobs dzs.  . Carotid artery occlusion   . CHF (congestive heart failure) (HCC)   . CVD (cerebrovascular disease)   . Diabetes mellitus type 2, controlled (HCC)   . GERD (gastroesophageal reflux disease)   . History of hiatal hernia   . Hyperlipidemia   . Hypertension   . Mitral regurgitation   . PVD (peripheral vascular disease) (HCC)   . Severe aortic stenosis    a. 09/2014 Echo: EF 40%, gr2 DD, sev AS (mean 34 mmHg, peak 57 mmHg), mod MR, mild LAE, nl RV, mild TR, triv PI, PASP 41 mmHg;  b. 11/2014 TAVR: 12/2014 Sapien 3 THV (size 41mm) via L femoral cutdown;  c. 11/2014 postop Echo: EF 45-50%, no rwma, Gr 1 DD, minimally increased tranvsvalvular velocity, no AI/AS, mild MR, mildly dil LA, PASP 12/2014.  Shortness of breath dyspnea     Past Surgical History:  Procedure Laterality Date  . ABDOMINAL HYSTERECTOMY     vaginal approach, partial   . CARDIAC CATHETERIZATION  04/07/07   By Dr. 04/09/07 -- 1. Severe LAD stenosis with successful PCI using a drug-eluting stent. 2. Severe left circumflex stenosis with successful PCI using a single  drug-eluting stent.  Excell Seltzer CARDIAC CATHETERIZATION N/A 10/07/2014   Procedure: Right/Left Heart Cath and Coronary Angiography;  Surgeon: 10/09/2014, MD;  Location[facility] INVASIVE CV LAB;  Service: Cardiovascular;  Laterality: N/A;  . EYE SURGERY     bilateral cataracts removed & /w IOL  . stents  2008  . TEE WITHOUT CARDIOVERSION N/A 11/26/2014   Procedure: TRANSESOPHAGEAL ECHOCARDIOGRAM (TEE);  Surgeon: Tonny Bollman, MD;  Location: [facility] OR;  Service: Open Heart Surgery;  Laterality: N/A;  . TRANSCATHETER AORTIC VALVE REPLACEMENT, TRANSFEMORAL N/A 11/26/2014   Procedure: TRANSCATHETER AORTIC VALVE REPLACEMENT, TRANSFEMORAL;  Surgeon: Tonny Bollman, MD;  Location: [facility] OR;  Service: Open Heart Surgery;  Laterality: N/A;  .  TYMPANOMASTOIDECTOMY  2002   right    Social History   Socioeconomic History  . Marital status: Widowed    Spouse name: Not on file  . Number of children: Not on file  . Years of education: Not on file  . Highest education level: Not on file  Occupational History  . 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 Smoking status: Former Smoker    Packs/day: 0.40    Years: 5.00    Pack years: 2.00    Types: Cigarettes    Quit date: 05/24/2006    Years since quitting: 14.3  . Smokeless tobacco: Never Used  Substance and Sexual Activity  . Alcohol use: Yes    Alcohol/week: 0.0 standard drinks    Comment: wine- Glass/ per day   . Drug use: No  . Sexual activity: Not on file  Other Topics Concern  . Not on file  Social History Narrative  . Not on file   Social Determinants of Health   Financial Resource Strain: Not on file  Food Insecurity: Not on file  Transportation Needs: Not on file  Physical Activity: Not on file  Stress: Not on file  Social Connections: Not on file  Intimate Partner Violence: Not on file    Family History  Problem Relation Age of Onset  . Heart attack Mother   . Heart disease Mother        Before age 75  . Hyperlipidemia Mother   . Hypertension Mother   . Heart disease Sister        Before age 81  . Hyperlipidemia Sister   . Hypertension Sister   . Heart disease Brother        Before age 84  . Hyperlipidemia Brother   . Heart attack Brother   . Heart disease Brother        Before age 59-  Stents - CHF  . Pneumonia Other   . Heart disease Son 55       After age 70  . Heart attack Sister   . Heart attack Son   . Stroke Maternal Uncle     ROS: no fevers or chills, productive cough, hemoptysis, dysphasia, odynophagia, melena, hematochezia, dysuria, hematuria, rash, seizure activity, orthopnea, PND, pedal edema, claudication. Remaining systems are negative.  Physical Exam: Well-developed well-nourished in no acute  distress.  Skin is warm and dry.  HEENT is normal.  Neck is supple.  Chest is clear to auscultation with normal expansion.  Cardiovascular exam is regular rate and rhythm.  2/6 systolic murmur left sternal border.  No diastolic murmur. Abdominal exam nontender or distended. No masses palpated. Extremities show no edema. neuro grossly intact  ECG-sinus bradycardia at a rate of 51, left ventricular hypertrophy with QRS widening.  Personally reviewed  A/P  1 coronary artery disease-patient denies recurrent chest pain.  Continue medical therapy with aspirin and statin.  2 previous aortic valve replacement-continue SBE prophylaxis.  Most recent echocardiogram showed no aortic insufficiency.  3 hypertension-blood pressure elevated; however controlled at home.  We will continue to follow.. Check potassium and renal function.  4 hyperlipidemia-continue statin.  Check lipids and liver.  5 carotid artery disease-plan follow-up carotid Dopplers March 2023.  Followed by vascular surgery.  Continue aspirin and statin.  6 dyspnea-etiology unclear.  She does not appear to be volume overloaded.  We will check PA and lateral chest x-ray, BNP and repeat echocardiogram.  Olga Millers, MD

## 2020-10-06 ENCOUNTER — Other Ambulatory Visit: Payer: Self-pay

## 2020-10-06 ENCOUNTER — Ambulatory Visit (INDEPENDENT_AMBULATORY_CARE_PROVIDER_SITE_OTHER): Payer: Medicare Other | Admitting: Cardiology

## 2020-10-06 ENCOUNTER — Encounter: Payer: Self-pay | Admitting: Cardiology

## 2020-10-06 VITALS — BP 170/62 | HR 51 | Ht 59.0 in | Wt 91.4 lb

## 2020-10-06 DIAGNOSIS — I359 Nonrheumatic aortic valve disorder, unspecified: Secondary | ICD-10-CM | POA: Diagnosis not present

## 2020-10-06 DIAGNOSIS — E7800 Pure hypercholesterolemia, unspecified: Secondary | ICD-10-CM

## 2020-10-06 DIAGNOSIS — I2510 Atherosclerotic heart disease of native coronary artery without angina pectoris: Secondary | ICD-10-CM | POA: Diagnosis not present

## 2020-10-06 DIAGNOSIS — I10 Essential (primary) hypertension: Secondary | ICD-10-CM | POA: Diagnosis not present

## 2020-10-06 DIAGNOSIS — R0609 Other forms of dyspnea: Secondary | ICD-10-CM

## 2020-10-06 DIAGNOSIS — R0600 Dyspnea, unspecified: Secondary | ICD-10-CM

## 2020-10-06 DIAGNOSIS — I6523 Occlusion and stenosis of bilateral carotid arteries: Secondary | ICD-10-CM | POA: Diagnosis not present

## 2020-10-06 NOTE — Addendum Note (Signed)
Addended by: Freddi Starr on: 10/06/2020 02:07 PM   Modules accepted: Orders

## 2020-10-06 NOTE — Patient Instructions (Signed)
  Lab Work:  Your physician recommends that you return for lab work in: FASTING  If you have labs (blood work) drawn today and your tests are completely normal, you will receive your results only by: Marland Kitchen MyChart Message (if you have MyChart) OR . A paper copy in the mail If you have any lab test that is abnormal or we need to change your treatment, we will call you to review the results.   Testing/Procedures:  Your physician has requested that you have an echocardiogram. Echocardiography is a painless test that uses sound waves to create images of your heart. It provides your doctor with information about the size and shape of your heart and how well your heart's chambers and valves are working. This procedure takes approximately one hour. There are no restrictions for this procedure.[address]   A chest x-ray takes a picture of the organs and structures inside the chest, including the heart, lungs, and blood vessels. This test can show several things, including, whether the heart is enlarges; whether fluid is building up in the lungs; and whether pacemaker / defibrillator leads are still in place. [facility]=[address]   Follow-Up: At [facility], you and your health needs are our priority.  As part of our continuing mission to provide you with exceptional heart care, we have created designated Provider Care Teams.  These Care Teams include your primary Cardiologist (physician) and Advanced Practice Providers (APPs -  Physician Assistants and Nurse Practitioners) who all work together to provide you with the care you need, when you need it.  We recommend signing up for the patient portal called "MyChart".  Sign up information is provided on this After Visit Summary.  MyChart is used to connect with patients for Virtual Visits (Telemedicine).  Patients are able to view lab/test results, encounter notes, upcoming appointments, etc.  Non-urgent messages can be sent  to your provider as well.   To learn more about what you can do with MyChart, go to [email or web address].    Your next appointment:   6 month(s)  The format for your next appointment:   In Person  Provider:   Olga Millers, MD

## 2020-10-07 ENCOUNTER — Ambulatory Visit (HOSPITAL_BASED_OUTPATIENT_CLINIC_OR_DEPARTMENT_OTHER)
Admission: RE | Admit: 2020-10-07 | Discharge: 2020-10-07 | Disposition: A | Discharge status: Home / Self Care | Payer: Medicare Other | Source: Ambulatory Visit | Attending: Cardiology | Admitting: Cardiology

## 2020-10-07 DIAGNOSIS — R0600 Dyspnea, unspecified: Secondary | ICD-10-CM | POA: Diagnosis present

## 2020-10-07 DIAGNOSIS — R0609 Other forms of dyspnea: Secondary | ICD-10-CM

## 2020-10-08 LAB — COMPREHENSIVE METABOLIC PANEL
ALT: 7 IU/L (ref 0–32)
AST: 15 IU/L (ref 0–40)
Albumin/Globulin Ratio: 1.4 (ref 1.2–2.2)
Albumin: 3.9 g/dL (ref 3.5–4.6)
Alkaline Phosphatase: 130 IU/L — ABNORMAL HIGH (ref 44–121)
BUN/Creatinine Ratio: 16 (ref 12–28)
BUN: 13 mg/dL (ref 10–36)
Bilirubin Total: 0.3 mg/dL (ref 0.0–1.2)
CO2: 24 mmol/L (ref 20–29)
Calcium: 9.3 mg/dL (ref 8.7–10.3)
Chloride: 102 mmol/L (ref 96–106)
Creatinine, Ser: 0.81 mg/dL (ref 0.57–1.00)
Globulin, Total: 2.8 g/dL (ref 1.5–4.5)
Glucose: 92 mg/dL (ref 65–99)
Potassium: 4.9 mmol/L (ref 3.5–5.2)
Sodium: 142 mmol/L (ref 134–144)
Total Protein: 6.7 g/dL (ref 6.0–8.5)
eGFR: 68 mL/min/{1.73_m2} (ref >59)

## 2020-10-08 LAB — LIPID PANEL
Chol/HDL Ratio: 2.6 ratio (ref 0.0–4.4)
Cholesterol, Total: 170 mg/dL (ref 100–199)
HDL: 65 mg/dL (ref >39)
LDL Chol Calc (NIH): 86 mg/dL (ref 0–99)
Triglycerides: 108 mg/dL (ref 0–149)
VLDL Cholesterol Cal: 19 mg/dL (ref 5–40)

## 2020-10-08 LAB — PRO B NATRIURETIC PEPTIDE: NT-Pro BNP: 393 pg/mL (ref 0–738)

## 2020-10-10 ENCOUNTER — Encounter: Payer: Self-pay | Admitting: *Deleted

## 2020-10-30 ENCOUNTER — Telehealth: Payer: Self-pay | Admitting: Cardiology

## 2020-10-30 ENCOUNTER — Other Ambulatory Visit: Payer: Self-pay

## 2020-10-30 NOTE — Telephone Encounter (Signed)
Attempted to return call to patients daughter (okay per DPR). Left message for patients daughter to call back when able.

## 2020-10-30 NOTE — Telephone Encounter (Signed)
[name], Daughter of the patient called. She is not able to get into the patient's MyChart, so she would like a Nurse to call her to go over the patient's recent labs.

## 2020-10-31 ENCOUNTER — Telehealth: Payer: Self-pay | Admitting: Cardiology

## 2020-10-31 ENCOUNTER — Ambulatory Visit (HOSPITAL_COMMUNITY): Payer: Medicare Other | Attending: Cardiology

## 2020-10-31 DIAGNOSIS — I359 Nonrheumatic aortic valve disorder, unspecified: Secondary | ICD-10-CM

## 2020-10-31 LAB — ECHOCARDIOGRAM COMPLETE
AV Mean grad: 9.5 mmHg
AV Peak grad: 17 mmHg
Ao pk vel: 2.06 m/s
Area-P 1/2: 3.08 cm2
S' Lateral: 3.1 cm

## 2020-10-31 NOTE — Telephone Encounter (Signed)
Left message for pt daughter, copy of lab results was placed in the mail to the patient. She is to call back with questions.

## 2020-10-31 NOTE — Telephone Encounter (Signed)
Left mesaage for daughter to call back if she has questions regarding the paperwork she received.

## 2020-10-31 NOTE — Telephone Encounter (Signed)
Pt was calling to let our office know that she received the Chest Xray and Labwork in the mail yesterday.Please advise

## 2020-11-11 NOTE — Telephone Encounter (Signed)
Lab results mailed to patient per previous message.  Will remove call from triage.

## 2021-09-11 ENCOUNTER — Other Ambulatory Visit: Payer: Self-pay

## 2021-09-11 DIAGNOSIS — I6523 Occlusion and stenosis of bilateral carotid arteries: Secondary | ICD-10-CM

## 2021-09-22 ENCOUNTER — Ambulatory Visit: Payer: Medicare Other

## 2021-09-22 ENCOUNTER — Encounter (HOSPITAL_COMMUNITY): Payer: Medicare Other

## 2021-09-24 NOTE — Progress Notes (Signed)
? ? ? ? ?HPI: Follow-up coronary disease and aortic valve replacement. Patient had cardiac catheterization May 2016 that showed patent stents in the LAD and circumflex and no other obstructive disease. Ejection fraction 50%.  Moderate mitral regurgitation and severe aortic stenosis. She underwent TAVR 7/16. She also has cerebrovascular disease followed by vascular surgery. Carotid Dopplers March 2022 showed 40 to 59% bilateral stenosis.  There was left subclavian stenosis.  Echocardiogram June 2022 showed normal LV function, mild left ventricular hypertrophy, grade 1 diastolic dysfunction, mild left atrial enlargement, mild mitral regurgitation, mild to moderate tricuspid regurgitation, prosthetic aortic valve with mean gradient 9 mmHg and trace aortic insufficiency.  Since last seen, she has been diagnosed with anemia.  Her hemoglobin in February was 7.9 with an MCV of 72.8.  She has been transfused and placed on iron with some improvement but follow-up hemoglobin again decreased and she recently received another transfusion.  When her hemoglobin gets low she feels shortness of breath with activities.  There is no orthopnea, PND or pedal edema.  No chest pain or syncope.  She has 1 bowel movement that had red blood but denies melena. ? ?Current Outpatient Medications  ?Medication Sig Dispense Refill  ? amLODipine (NORVASC) 5 MG tablet TAKE 1 TABLET BY MOUTH EVERY DAY 90 tablet 3  ? aspirin 81 MG tablet Take 81 mg by mouth daily.    ? Aspirin-Calcium Carbonate 81-777 MG TABS Take by mouth.    ? atorvastatin (LIPITOR) 80 MG tablet TAKE 1 TABLET BY MOUTH EVERY DAY 90 tablet 3  ? guaiFENesin (MUCINEX) 600 MG 12 hr tablet Take 1,200 mg by mouth 2 (two) times daily as needed for cough or to loosen phlegm.    ? lisinopril (ZESTRIL) 40 MG tablet TAKE 1 TABLET BY MOUTH EVERY DAY 90 tablet 3  ? metoprolol tartrate (LOPRESSOR) 25 MG tablet TAKE 1 TAB (25MG  TOTAL) BY MOUTH 2 (TWO) TIMES DAILY. PLEASE KEEP UPCOMING APPT FOR  FURTHER REFILLS. 180 tablet 1  ? ?No current facility-administered medications for this visit.  ? ? ? ?Past Medical History:  ?Diagnosis Date  ? Aortic insufficiency   ? Arthritis   ? told that she has OA- pt. reports that she doesn't have any pain from it but she reports that she has back pain from time to time   ? CAD (coronary artery disease)   ? a. 09/2014 Cath: patent LAD/LCX stents, otw nonobs dzs.  ? Carotid artery occlusion   ? CHF (congestive heart failure) ([place])   ? CVD (cerebrovascular disease)   ? Diabetes mellitus type 2, controlled ([place])   ? GERD (gastroesophageal reflux disease)   ? History of hiatal hernia   ? Hyperlipidemia   ? Hypertension   ? Mitral regurgitation   ? PVD (peripheral vascular disease) ([place])   ? Severe aortic stenosis   ? a. 09/2014 Echo: EF 40%, gr2 DD, sev AS (mean 34 mmHg, peak 57 mmHg), mod MR, mild LAE, nl RV, mild TR, triv PI, PASP 41 mmHg;  b. 11/2014 TAVR: Oletta Lamas Sapien 3 THV (size 62mm) via L femoral cutdown;  c. 11/2014 postop Echo: EF 45-50%, no rwma, Gr 1 DD, minimally increased tranvsvalvular velocity, no AI/AS, mild MR, mildly dil LA, PASP 57mmHg.  ? Shortness of breath dyspnea   ? ? ?Past Surgical History:  ?Procedure Laterality Date  ? ABDOMINAL HYSTERECTOMY    ? vaginal approach, partial   ? CARDIAC CATHETERIZATION  04/07/07  ? By Dr. Burt Knack -- 1. Severe  LAD stenosis with successful PCI using a drug-eluting stent. 2. Severe left circumflex stenosis with successful PCI using a single  drug-eluting stent.  ? CARDIAC CATHETERIZATION N/A 10/07/2014  ? Procedure: Right/Left Heart Cath and Coronary Angiography;  Surgeon: Sherren Mocha, MD;  Location: [facility];  Service: Cardiovascular;  Laterality: N/A;  ? EYE SURGERY    ? bilateral cataracts removed & /w IOL  ? stents  2008  ? TEE WITHOUT CARDIOVERSION N/A 11/26/2014  ? Procedure: TRANSESOPHAGEAL ECHOCARDIOGRAM (TEE);  Surgeon: Sherren Mocha, MD;  Location: [facility];  Service: Open Heart Surgery;  Laterality: N/A;  ?  TRANSCATHETER AORTIC VALVE REPLACEMENT, TRANSFEMORAL N/A 11/26/2014  ? Procedure: TRANSCATHETER AORTIC VALVE REPLACEMENT, TRANSFEMORAL;  Surgeon: Sherren Mocha, MD;  Location: [facility];  Service: Open Heart Surgery;  Laterality: N/A;  ? TYMPANOMASTOIDECTOMY  2002  ? right  ? ? ?Social History  ? ?Socioeconomic History  ? Marital status: Widowed  ?  Spouse name: Not on file  ? Number of children: Not on file  ? Years of education: Not on file  ? Highest education level: Not on file  ?Occupational History  ? 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 Smoking status: Former  ?  Packs/day: 0.40  ?  Years: 5.00  ?  Pack years: 2.00  ?  Types: Cigarettes  ?  Quit date: 05/24/2006  ?  Years since quitting: 15.3  ? Smokeless tobacco: Never  ?Substance and Sexual Activity  ? Alcohol use: Yes  ?  Alcohol/week: 0.0 standard drinks  ?  Comment: wine- Glass/ per day   ? Drug use: No  ? Sexual activity: Not on file  ?Other Topics Concern  ? Not on file  ?Social History Narrative  ? Not on file  ? ?Social Determinants of Health  ? ?Financial Resource Strain: Not on file  ?Food Insecurity: Not on file  ?Transportation Needs: Not on file  ?Physical Activity: Not on file  ?Stress: Not on file  ?Social Connections: Not on file  ?Intimate Partner Violence: Not on file  ? ? ?Family History  ?Problem Relation Age of Onset  ? Heart attack Mother   ? Heart disease Mother   ?     Before age 80  ? Hyperlipidemia Mother   ? Hypertension Mother   ? Heart disease Sister   ?     Before age 87  ? Hyperlipidemia Sister   ? Hypertension Sister   ? Heart disease Brother   ?     Before age 39  ? Hyperlipidemia Brother   ? Heart attack Brother   ? Heart disease Brother   ?     Before age 18-  Stents - CHF  ? Pneumonia Other   ? Heart disease Son 50  ?     After age 46  ? Heart attack Sister   ? Heart attack Son   ? Stroke Maternal Uncle   ? ? ?ROS: Weight loss of 15 pounds and decreased appetite but no fevers or chills, productive  cough, hemoptysis, dysphasia, odynophagia, melena, hematochezia, dysuria, hematuria, rash, seizure activity, orthopnea, PND, pedal edema, claudication. Remaining systems are negative. ? ?Physical Exam: ?Well-developed well-nourished in no acute distress.  ?Skin is warm and dry.  ?HEENT is normal.  ?Neck is supple.  ?Chest is clear to auscultation with normal expansion.  ?Cardiovascular exam is regular rate and rhythm.  ?Abdominal exam tender right lower quadrant.  No masses palpated.   ?  Extremities show no edema. ?neuro grossly intact ? ?ECG-normal sinus rhythm at a rate of 65, left bundle branch block.  Personally reviewed ? ?A/P ? ?1 coronary artery disease-patient doing well with no chest pain.  Continue statin.  Given recent anemia requiring transfusion we will hold aspirin. ? ?2 status post aortic valve replacement-most recent echocardiogram showed normally functioning valve.  Continue SBE prophylaxis.  Given recent anemia we will arrange repeat echocardiogram to assess aortic valve to make sure it is stable (I think she most likely has GI blood loss but should consider hemolysis). ? ?3 hypertension-patient's blood pressure is controlled.  Continue present medications and follow. ? ?4 hyperlipidemia-continue statin. ? ?5 carotid artery disease-followed by vascular surgery.  Continue aspirin and statin. ? ?6 microcytic anemia-likely GI blood loss.  She is scheduled to see gastroenterology.  We will discontinue aspirin for now.  Hemoglobin followed by primary care. ? ?Kirk Ruths, MD ? ? ? ?

## 2021-09-28 ENCOUNTER — Encounter: Payer: Self-pay | Admitting: Cardiology

## 2021-09-28 ENCOUNTER — Ambulatory Visit (INDEPENDENT_AMBULATORY_CARE_PROVIDER_SITE_OTHER): Payer: Medicare Other | Admitting: Cardiology

## 2021-09-28 VITALS — BP 138/52 | HR 65 | Ht 59.0 in | Wt 85.0 lb

## 2021-09-28 DIAGNOSIS — I2510 Atherosclerotic heart disease of native coronary artery without angina pectoris: Secondary | ICD-10-CM | POA: Diagnosis not present

## 2021-09-28 DIAGNOSIS — R0609 Other forms of dyspnea: Secondary | ICD-10-CM | POA: Diagnosis not present

## 2021-09-28 DIAGNOSIS — I359 Nonrheumatic aortic valve disorder, unspecified: Secondary | ICD-10-CM

## 2021-09-28 DIAGNOSIS — E7800 Pure hypercholesterolemia, unspecified: Secondary | ICD-10-CM

## 2021-09-28 DIAGNOSIS — I6523 Occlusion and stenosis of bilateral carotid arteries: Secondary | ICD-10-CM

## 2021-09-28 DIAGNOSIS — I10 Essential (primary) hypertension: Secondary | ICD-10-CM | POA: Diagnosis not present

## 2021-09-28 NOTE — Patient Instructions (Signed)
?  Testing/Procedures: ? ?Your physician has requested that you have an echocardiogram. Echocardiography is a painless test that uses sound waves to create images of your heart. It provides your doctor with information about the size and shape of your heart and how well your heart?s chambers and valves are working. This procedure takes approximately one hour. There are no restrictions for this procedure. [facility] ? ? ?Follow-Up: ?At [facility], you and your health needs are our priority.  As part of our continuing mission to provide you with exceptional heart care, we have created designated Provider Care Teams.  These Care Teams include your primary Cardiologist (physician) and Advanced Practice Providers (APPs -  Physician Assistants and Nurse Practitioners) who all work together to provide you with the care you need, when you need it. ? ?We recommend signing up for the patient portal called "MyChart".  Sign up information is provided on this After Visit Summary.  MyChart is used to connect with patients for Virtual Visits (Telemedicine).  Patients are able to view lab/test results, encounter notes, upcoming appointments, etc.  Non-urgent messages can be sent to your provider as well.   ?To learn more about what you can do with MyChart, go to [email or web address].   ? ?Your next appointment:   ?4 month(s) ? ?The format for your next appointment:   ?In Person ? ?Provider:   ?Olga Millers, MD   ? ? ?Important Information About [employer] ? ? ? ? ?  ?

## 2021-09-29 ENCOUNTER — Ambulatory Visit (HOSPITAL_BASED_OUTPATIENT_CLINIC_OR_DEPARTMENT_OTHER)
Admission: RE | Admit: 2021-09-29 | Discharge: 2021-09-29 | Disposition: A | Discharge status: Home / Self Care | Payer: Medicare Other | Source: Ambulatory Visit | Attending: Cardiology | Admitting: Cardiology

## 2021-09-29 DIAGNOSIS — R0609 Other forms of dyspnea: Secondary | ICD-10-CM | POA: Diagnosis present

## 2021-09-29 DIAGNOSIS — Z952 Presence of prosthetic heart valve: Secondary | ICD-10-CM

## 2021-09-29 LAB — ECHOCARDIOGRAM COMPLETE
AR max vel: 0.93 cm2
AV Area VTI: 0.87 cm2
AV Area mean vel: 0.9 cm2
AV Mean grad: 12 mmHg
AV Peak grad: 25 mmHg
Ao pk vel: 2.5 m/s
Area-P 1/2: 3.34 cm2
S' Lateral: 3 cm

## 2021-09-29 NOTE — Progress Notes (Signed)
?  Echocardiogram ?2D Echocardiogram has been performed. ? ?Kara Santiago ?09/29/2021, 10:48 AM ?

## 2021-11-26 ENCOUNTER — Telehealth: Payer: Self-pay | Admitting: Cardiology

## 2021-11-26 ENCOUNTER — Telehealth: Payer: Self-pay | Admitting: *Deleted

## 2021-11-26 NOTE — Telephone Encounter (Signed)
   Pre-operative Risk Assessment    Patient Name: Kara Santiago  DOB: 1925-06-29 MRN: [id number]      Request for Surgical Clearance    Procedure:   Laparoscopy Ileectomy  Date of Surgery:  Clearance 12/08/21                                 Surgeon:  Dr. Suanne Marker Group or Practice Name:  [facility] Phone number:  [phone] Fax number:  [phone]   Type of Clearance Requested:   - Medical    Type of Anesthesia:  General    Additional requests/questions:   Please call office if patient needs appt.   Emmaline Kluver   11/26/2021, 9:24 AM

## 2021-11-26 NOTE — Telephone Encounter (Signed)
Daughter stated patient needs surgical clearance for bowel surgery on 7/18. Patient was seen by surgeon this AM and was told she had a new LBBB. I informed daughter that patient already has documentation of a left BBB. Informed daughter that the surgeon needs to fax a surgical clearance request to our clinic. Fax number provided to her. She wanted patient to see APP. Scheduled for 7/11 (she might cancel).

## 2021-11-26 NOTE — Telephone Encounter (Signed)
Pt's daughter would like for nurse to call back in regards to an up coming  procedure that pt is scheduled to have on 7/18. Please advise

## 2021-11-26 NOTE — Telephone Encounter (Signed)
Pt has appt set up already for in office appt with Bernadene Person, NP. I will forward notes to NP for appt.

## 2021-11-26 NOTE — Telephone Encounter (Signed)
I s/w DPR pt's daughter who is agreeable to change appt over to tele visit per pre op provider Bernadene Person, NP. Tele visit 12/02/21 @ 10 am. I will cancel the in office appt. Med rec and consent are done.

## 2021-11-26 NOTE — Telephone Encounter (Signed)
I s/w DPR pt's daughter who is agreeable to change appt over to tele visit per pre op provider Bernadene Person, NP. Tele visit 12/02/21 @ 10 am. I will cancel the in office appt. Med rec and consent are done.     Patient Consent for Virtual Visit        Kara Santiago has provided verbal consent on 11/26/2021 for a virtual visit (video or telephone).   CONSENT FOR VIRTUAL VISIT FOR:  Kara Santiago  By participating in this virtual visit I agree to the following:  I hereby voluntarily request, consent and authorize [facility] and its employed or contracted physicians, physician assistants, nurse practitioners or other licensed health care professionals (the Practitioner), to provide me with telemedicine health care services (the "Services") as deemed necessary by the treating Practitioner. I acknowledge and consent to receive the Services by the Practitioner via telemedicine. I understand that the telemedicine visit will involve communicating with the Practitioner through live audiovisual communication technology and the disclosure of certain medical information by electronic transmission. I acknowledge that I have been given the opportunity to request an in-person assessment or other available alternative prior to the telemedicine visit and am voluntarily participating in the telemedicine visit.  I understand that I have the right to withhold or withdraw my consent to the use of telemedicine in the course of my care at any time, without affecting my right to future care or treatment, and that the Practitioner or I may terminate the telemedicine visit at any time. I understand that I have the right to inspect all information obtained and/or recorded in the course of the telemedicine visit and may receive copies of available information for a reasonable fee.  I understand that some of the potential risks of receiving the Services via telemedicine include:  Delay or interruption in medical  evaluation due to technological equipment failure or disruption; Information transmitted may not be sufficient (e.g. poor resolution of images) to allow for appropriate medical decision making by the Practitioner; and/or  In rare instances, security protocols could fail, causing a breach of personal health information.  Furthermore, I acknowledge that it is my responsibility to provide information about my medical history, conditions and care that is complete and accurate to the best of my ability. I acknowledge that Practitioner's advice, recommendations, and/or decision may be based on factors not within their control, such as incomplete or inaccurate data provided by me or distortions of diagnostic images or specimens that may result from electronic transmissions. I understand that the practice of medicine is not an exact science and that Practitioner makes no warranties or guarantees regarding treatment outcomes. I acknowledge that a copy of this consent can be made available to me via my patient portal [facility] MyChart), or I can request a printed copy by calling the office of [facility].    I understand that my insurance will be billed for this visit.   I have read or had this consent read to me. I understand the contents of this consent, which adequately explains the benefits and risks of the Services being provided via telemedicine.  I have been provided ample opportunity to ask questions regarding this consent and the Services and have had my questions answered to my satisfaction. I give my informed consent for the services to be provided through the use of telemedicine in my medical care

## 2021-11-26 NOTE — Telephone Encounter (Signed)
   Name: Kara Santiago  DOB: [birth year]  MRN: [id number]  Primary Cardiologist: Olga Millers, MD   Preoperative team, please contact this patient and set up a phone call appointment for further preoperative risk assessment. Please obtain consent and complete medication review. Thank you for your help.  I confirm that guidance regarding antiplatelet and oral anticoagulation therapy has been completed and, if necessary, noted below.   Joylene Grapes, NP 11/26/2021, 10:15 AM [facility] [address] [place], [place] [address]

## 2021-11-26 NOTE — Telephone Encounter (Signed)
Daughter informed that preop pool will contact her regarding clearance and televisit.

## 2021-12-01 ENCOUNTER — Ambulatory Visit: Payer: Medicare Other | Admitting: Nurse Practitioner

## 2021-12-01 NOTE — Progress Notes (Deleted)
Office Visit    Patient Name: Kara Santiago Date of Encounter: 12/01/2021  Primary Care Provider:  Stefanie Libel, MD Primary Cardiologist:  Olga Millers, MD  Chief Complaint    ***  Past Medical History    Past Medical History:  Diagnosis Date   Aortic insufficiency    Arthritis    told that she has OA- pt. reports that she doesn't have any pain from it but she reports that she has back pain from time to time    CAD (coronary artery disease)    a. 09/2014 Cath: patent LAD/LCX stents, otw nonobs dzs.   Carotid artery occlusion    CHF (congestive heart failure) ([facility])    CVD (cerebrovascular disease)    Diabetes mellitus type 2, controlled ([facility])    GERD (gastroesophageal reflux disease)    History of hiatal hernia    Hyperlipidemia    Hypertension    Mitral regurgitation    PVD (peripheral vascular disease) ([facility])    Severe aortic stenosis    a. 09/2014 Echo: EF 40%, gr2 DD, sev AS (mean 34 mmHg, peak 57 mmHg), mod MR, mild LAE, nl RV, mild TR, triv PI, PASP 41 mmHg;  b. 11/2014 TAVR: Randa Evens Sapien 3 THV (size 19mm) via L femoral cutdown;  c. 11/2014 postop Echo: EF 45-50%, no rwma, Gr 1 DD, minimally increased tranvsvalvular velocity, no AI/AS, mild MR, mildly dil LA, PASP .   Shortness of breath dyspnea    Past Surgical History:  Procedure Laterality Date   ABDOMINAL HYSTERECTOMY     vaginal approach, partial    CARDIAC CATHETERIZATION  04/07/07   By Dr. Excell Seltzer -- 1. Severe LAD stenosis with successful PCI using a drug-eluting stent. 2. Severe left circumflex stenosis with successful PCI using a single  drug-eluting stent.   CARDIAC CATHETERIZATION N/A 10/07/2014   Procedure: Right/Left Heart Cath and Coronary Angiography;  Surgeon: Tonny Bollman, MD;  [facility]: [facility];  Service: Cardiovascular;  Laterality: N/A;   EYE SURGERY     bilateral cataracts removed & /w IOL   stents  2008   TEE WITHOUT CARDIOVERSION N/A 11/26/2014   Procedure:  TRANSESOPHAGEAL ECHOCARDIOGRAM (TEE);  Surgeon: Tonny Bollman, MD;  Location: [facility];  Service: Open Heart Surgery;  Laterality: N/A;   TRANSCATHETER AORTIC VALVE REPLACEMENT, TRANSFEMORAL N/A 11/26/2014   Procedure: TRANSCATHETER AORTIC VALVE REPLACEMENT, TRANSFEMORAL;  Surgeon: Tonny Bollman, MD;  Location: [facility];  Service: Open Heart Surgery;  Laterality: N/A;   TYMPANOMASTOIDECTOMY  2002   right    Allergies  Allergies  Allergen Reactions   Contrast Media [Iodinated Contrast Media] Rash    Blister's   Other Rash    Blister's    History of Present Illness    ***  Home Medications    Current Outpatient Medications  Medication Sig Dispense Refill   amLODipine (NORVASC) 5 MG tablet TAKE 1 TABLET BY MOUTH EVERY DAY 90 tablet 3   aspirin 81 MG tablet Take 81 mg by mouth daily.     Aspirin-Calcium Carbonate 81-777 MG TABS Take by mouth.     atorvastatin (LIPITOR) 80 MG tablet TAKE 1 TABLET BY MOUTH EVERY DAY 90 tablet 3   guaiFENesin (MUCINEX) 600 MG 12 hr tablet Take 1,200 mg by mouth 2 (two) times daily as needed for cough or to loosen phlegm.     lisinopril (ZESTRIL) 40 MG tablet TAKE 1 TABLET BY MOUTH EVERY DAY 90 tablet 3   metoprolol tartrate (LOPRESSOR) 25 MG  tablet TAKE 1 TAB (25MG  TOTAL) BY MOUTH 2 (TWO) TIMES DAILY. PLEASE KEEP UPCOMING APPT FOR FURTHER REFILLS. 180 tablet 1   No current facility-administered medications for this visit.     Review of Systems    ***.  All other systems reviewed and are otherwise negative except as noted above.    Physical Exam    VS:  There were no vitals taken for this visit. , BMI There is no height or weight on file to calculate BMI.     GEN: Well nourished, well developed, in no acute distress. HEENT: normal. Neck: Supple, no JVD, carotid bruits, or masses. Cardiac: RRR, no murmurs, rubs, or gallops. No clubbing, cyanosis, edema.  Radials/DP/PT 2+ and equal bilaterally.  Respiratory:  Respirations regular and unlabored, clear  to auscultation bilaterally. GI: Soft, nontender, nondistended, BS + x 4. MS: no deformity or atrophy. Skin: warm and dry, no rash. Neuro:  Strength and sensation are intact. Psych: Normal affect.  Accessory Clinical Findings    ECG personally reviewed by me today - *** - no acute changes.  Lab Results  Component Value Date   WBC 6.1 11/24/2018   HGB 13.3 11/24/2018   HCT 39.4 11/24/2018   MCV 92.9 11/24/2018   PLT 404 (H) 11/24/2018   Lab Results  Component Value Date   CREATININE 0.81 10/07/2020   BUN 13 10/07/2020   NA 142 10/07/2020   K 4.9 10/07/2020   CL 102 10/07/2020   CO2 24 10/07/2020   Lab Results  Component Value Date   ALT 7 10/07/2020   AST 15 10/07/2020   ALKPHOS 130 (H) 10/07/2020   BILITOT 0.3 10/07/2020   Lab Results  Component Value Date   CHOL 170 10/07/2020   HDL 65 10/07/2020   LDLCALC 86 10/07/2020   LDLDIRECT 151.7 12/22/2012   TRIG 108 10/07/2020   CHOLHDL 2.6 10/07/2020    Lab Results  Component Value Date   HGBA1C 6.1 (H) 11/22/2018    Assessment & Plan    1.  ***   01/23/2019, NP 12/01/2021, 6:14 AM

## 2021-12-02 ENCOUNTER — Ambulatory Visit (INDEPENDENT_AMBULATORY_CARE_PROVIDER_SITE_OTHER): Payer: Medicare Other | Admitting: Nurse Practitioner

## 2021-12-02 ENCOUNTER — Encounter: Payer: Self-pay | Admitting: Nurse Practitioner

## 2021-12-02 DIAGNOSIS — Z01810 Encounter for preprocedural cardiovascular examination: Secondary | ICD-10-CM

## 2021-12-02 NOTE — Progress Notes (Signed)
Virtual Visit via Telephone Note   Because of Kara Santiago's co-morbid illnesses, she is at least at moderate risk for complications without adequate follow up.  This format is felt to be most appropriate for this patient at this time.  The patient did not have access to video technology/had technical difficulties with video requiring transitioning to audio format only (telephone).  All issues noted in this document were discussed and addressed.  No physical exam could be performed with this format.  Please refer to the patient's chart for her consent to telehealth for [facility].  Evaluation Performed:  Preoperative cardiovascular risk assessment _____________   Date:  12/02/2021   Patient ID:  Kara Santiago, DOB 11-Feb-1926, MRN [id number] Patient Location:  Home Provider location:   Office  Primary Care Provider:  Stefanie Libel, MD Primary Cardiologist:  Olga Millers, MD  Chief Complaint / Patient Profile   88 y.o. y/o female with a h/o aortic stenosis s/p TAVR, CAD s/p DES to LAD, DES to LCx, patent stents by cath 2016, moderate mitral regurgitation, hypertension, hyperlipidemia, left subclavian stenosis, PVD,  who is pending laparoscopic ileectomy and presents today for telephonic preoperative cardiovascular risk assessment.  Past Medical History    Past Medical History:  Diagnosis Date   Aortic insufficiency    Arthritis    told that she has OA- pt. reports that she doesn't have any pain from it but she reports that she has back pain from time to time    CAD (coronary artery disease)    a. 09/2014 Cath: patent LAD/LCX stents, otw nonobs dzs.   Carotid artery occlusion    CHF (congestive heart failure) (HCC)    CVD (cerebrovascular disease)    Diabetes mellitus type 2, controlled (HCC)    GERD (gastroesophageal reflux disease)    History of hiatal hernia    Hyperlipidemia    Hypertension    Mitral regurgitation    PVD (peripheral vascular disease) (HCC)     Severe aortic stenosis    a. 09/2014 Echo: EF 40%, gr2 DD, sev AS (mean 34 mmHg, peak 57 mmHg), mod MR, mild LAE, nl RV, mild TR, triv PI, PASP 41 mmHg;  b. 11/2014 TAVR: Randa Evens Sapien 3 THV (size 1mm) via L femoral cutdown;  c. 11/2014 postop Echo: EF 45-50%, no rwma, Gr 1 DD, minimally increased tranvsvalvular velocity, no AI/AS, mild MR, mildly dil LA, PASP .   Shortness of breath dyspnea    Past Surgical History:  Procedure Laterality Date   ABDOMINAL HYSTERECTOMY     vaginal approach, partial    CARDIAC CATHETERIZATION  04/07/07   By Dr. Excell Seltzer -- 1. Severe LAD stenosis with successful PCI using a drug-eluting stent. 2. Severe left circumflex stenosis with successful PCI using a single  drug-eluting stent.   CARDIAC CATHETERIZATION N/A 10/07/2014   Procedure: Right/Left Heart Cath and Coronary Angiography;  Surgeon: Tonny Bollman, MD;  Location: [facility] INVASIVE CV LAB;  Service: Cardiovascular;  Laterality: N/A;   EYE SURGERY     bilateral cataracts removed & /w IOL   stents  2008   TEE WITHOUT CARDIOVERSION N/A 11/26/2014   Procedure: TRANSESOPHAGEAL ECHOCARDIOGRAM (TEE);  Surgeon: Tonny Bollman, MD;  Location: [facility] OR;  Service: Open Heart Surgery;  Laterality: N/A;   TRANSCATHETER AORTIC VALVE REPLACEMENT, TRANSFEMORAL N/A 11/26/2014   Procedure: TRANSCATHETER AORTIC VALVE REPLACEMENT, TRANSFEMORAL;  Surgeon: Tonny Bollman, MD;  Location: [facility] OR;  Service: Open Heart Surgery;  Laterality: N/A;   TYMPANOMASTOIDECTOMY  2002  right    Allergies  Allergies  Allergen Reactions   Contrast Media [Iodinated Contrast Media] Rash    Blister's   Other Rash    Blister's    History of Present Illness    Kara Santiago is a 88 y.o. female who presents via audio/video conferencing for a telehealth visit today.  Pt was last seen in cardiology clinic on 09/28/21 by Dr. Jens Som.  At that time [name] was doing well.  The patient is now pending procedure as outlined above. Since  her last visit, she  denies chest pain, lower extremity edema, palpitations, diaphoresis, presyncope, syncope, orthopnea, and PND. She reports weakness, fatigue, shortness of breath are worsened with anemia and improved after a recent blood transfusion. She remains active doing house work and ADLs.    Home Medications    Prior to Admission medications   Medication Sig Start Date End Date Taking? Authorizing Provider  amLODipine (NORVASC) 5 MG tablet TAKE 1 TABLET BY MOUTH EVERY DAY 10/09/19   Lewayne Bunting, MD  aspirin 81 MG tablet Take 81 mg by mouth daily.    [provider]  Aspirin-Calcium Carbonate 81-777 MG TABS Take by mouth.    [provider]  atorvastatin (LIPITOR) 80 MG tablet TAKE 1 TABLET BY MOUTH EVERY DAY 05/15/19   Lewayne Bunting, MD  guaiFENesin (MUCINEX) 600 MG 12 hr tablet Take 1,200 mg by mouth 2 (two) times daily as needed for cough or to loosen phlegm.    [provider]  lisinopril (ZESTRIL) 40 MG tablet TAKE 1 TABLET BY MOUTH EVERY DAY 10/09/19   Lewayne Bunting, MD  metoprolol tartrate (LOPRESSOR) 25 MG tablet TAKE 1 TAB (25MG  TOTAL) BY MOUTH 2 (TWO) TIMES DAILY. PLEASE KEEP UPCOMING APPT FOR FURTHER REFILLS. 12/28/18   02/27/19, MD    Physical Exam    Vital Signs:  [name] does not have vital signs available for review today.  Given telephonic nature of communication, physical exam is limited. AAOx3. NAD. Normal affect.  Speech and respirations are unlabored.  Accessory Clinical Findings    None  Assessment & Plan    1.  Preoperative Cardiovascular Risk Assessment: The patient is doing well from a cardiac perspective. Therefore, based on ACC/AHA guidelines, the patient would be at acceptable risk for the planned procedure without further cardiovascular testing. The patient was advised that if he develops new symptoms prior to surgery to contact our office to arrange for a follow-up visit, and he verbalized  understanding. According to the Revised Cardiac Risk Index (RCRI), her Perioperative Risk of Major Cardiac Event is (%): 6.6. Her Functional Capacity in METs is: 5.07 according to the Duke Activity Status Index (DASI).  A copy of this note will be routed to requesting surgeon.  Time:   Today, I have spent 10 minutes with the patient with telehealth technology discussing medical history, symptoms, and management plan.     Barry Brunner, NP-C    12/02/2021, 10:07 AM [facility] [address], [address] Office [phone] Fax [phone]

## 2022-01-20 NOTE — Progress Notes (Signed)
HPI: Follow-up coronary disease and aortic valve replacement. Patient had cardiac catheterization May 2016 that showed patent stents in the LAD and circumflex and no other obstructive disease. Ejection fraction 50%.  Moderate mitral regurgitation and severe aortic stenosis. She underwent TAVR 7/16. She also has cerebrovascular disease followed by vascular surgery. Carotid Dopplers March 2022 showed 40 to 59% bilateral stenosis.  There was left subclavian stenosis. Echocardiogram May 2023 showed normal LV function, mild left ventricular hypertrophy, grade 1 diastolic dysfunction, mild left atrial enlargement, moderate mitral regurgitation, status post aortic valve replacement with mean gradient 12 mmHg and no aortic insufficiency.  Since last seen, she denies dyspnea, chest pain, palpitations or syncope.  Current Outpatient Medications  Medication Sig Dispense Refill   amLODipine (NORVASC) 5 MG tablet TAKE 1 TABLET BY MOUTH EVERY DAY 90 tablet 3   atorvastatin (LIPITOR) 80 MG tablet TAKE 1 TABLET BY MOUTH EVERY DAY 90 tablet 3   Cyanocobalamin (VITAMIN B-12 PO) Take 1 tablet by mouth daily.     ferrous sulfate 325 (65 FE) MG tablet Take 325 mg by mouth daily with breakfast.     lisinopril (ZESTRIL) 40 MG tablet TAKE 1 TABLET BY MOUTH EVERY DAY 90 tablet 3   metoprolol tartrate (LOPRESSOR) 25 MG tablet TAKE 1 TAB (25MG  TOTAL) BY MOUTH 2 (TWO) TIMES DAILY. PLEASE KEEP UPCOMING APPT FOR FURTHER REFILLS. 180 tablet 1   polyethylene glycol (MIRALAX / GLYCOLAX) 17 g packet Take 17 g by mouth daily.     No current facility-administered medications for this visit.     Past Medical History:  Diagnosis Date   Aortic insufficiency    Arthritis    told that she has OA- pt. reports that she doesn't have any pain from it but she reports that she has back pain from time to time    CAD (coronary artery disease)    a. 09/2014 Cath: patent LAD/LCX stents, otw nonobs dzs.   Carotid artery occlusion    CHF  (congestive heart failure) (HCC)    CVD (cerebrovascular disease)    Diabetes mellitus type 2, controlled (HCC)    GERD (gastroesophageal reflux disease)    History of hiatal hernia    Hyperlipidemia    Hypertension    Mitral regurgitation    PVD (peripheral vascular disease) (HCC)    Severe aortic stenosis    a. 09/2014 Echo: EF 40%, gr2 DD, sev AS (mean 34 mmHg, peak 57 mmHg), mod MR, mild LAE, nl RV, mild TR, triv PI, PASP 41 mmHg;  b. 11/2014 TAVR: Oletta Lamas Sapien 3 THV (size 79mm) via L femoral cutdown;  c. 11/2014 postop Echo: EF 45-50%, no rwma, Gr 1 DD, minimally increased tranvsvalvular velocity, no AI/AS, mild MR, mildly dil LA, PASP 4mmHg.   Shortness of breath dyspnea     Past Surgical History:  Procedure Laterality Date   ABDOMINAL HYSTERECTOMY     vaginal approach, partial    CARDIAC CATHETERIZATION  04/07/07   By Dr. Burt Knack -- 1. Severe LAD stenosis with successful PCI using a drug-eluting stent. 2. Severe left circumflex stenosis with successful PCI using a single  drug-eluting stent.   CARDIAC CATHETERIZATION N/A 10/07/2014   Procedure: Right/Left Heart Cath and Coronary Angiography;  Surgeon: Sherren Mocha, MD;  Location: [place] CV LAB;  Service: Cardiovascular;  Laterality: N/A;   EYE SURGERY     bilateral cataracts removed & /w IOL   stents  2008   TEE WITHOUT CARDIOVERSION N/A 11/26/2014  Procedure: TRANSESOPHAGEAL ECHOCARDIOGRAM (TEE);  Surgeon: Tonny Bollman, MD;  Location: [facility] OR;  Service: Open Heart Surgery;  Laterality: N/A;   TRANSCATHETER AORTIC VALVE REPLACEMENT, TRANSFEMORAL N/A 11/26/2014   Procedure: TRANSCATHETER AORTIC VALVE REPLACEMENT, TRANSFEMORAL;  Surgeon: Tonny Bollman, MD;  Location: [facility] OR;  Service: Open Heart Surgery;  Laterality: N/A;   TYMPANOMASTOIDECTOMY  2002   right    Social History   Socioeconomic History   Marital status: Widowed    Spouse name: Not on file   Number of children: Not on file   Years of education: Not on file    Highest education level: Not on file  Occupational History   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  Tobacco Use   Smoking status: Former    Packs/day: 0.40    Years: 5.00    Total pack years: 2.00    Types: Cigarettes    Quit date: 05/24/2006    Years since quitting: 15.7   Smokeless tobacco: Never  Substance and Sexual Activity   Alcohol use: Yes    Alcohol/week: 0.0 standard drinks of alcohol    Comment: wine- Glass/ per day    Drug use: No   Sexual activity: Not on file  Other Topics Concern   Not on file  Social History Narrative   Not on file   Social Determinants of Health   Financial Resource Strain: Not on file  Food Insecurity: Not on file  Transportation Needs: Not on file  Physical Activity: Not on file  Stress: Not on file  Social Connections: Not on file  Intimate Partner Violence: Not on file    Family History  Problem Relation Age of Onset   Heart attack Mother    Heart disease Mother        Before age 52   Hyperlipidemia Mother    Hypertension Mother    Heart disease Sister        Before age 74   Hyperlipidemia Sister    Hypertension Sister    Heart disease Brother        Before age 68   Hyperlipidemia Brother    Heart attack Brother    Heart disease Brother        Before age 23-  Stents - CHF   Pneumonia Other    Heart disease Son 93       After age 14   Heart attack Sister    Heart attack Son    Stroke Maternal Uncle     ROS: no fevers or chills, productive cough, hemoptysis, dysphasia, odynophagia, melena, hematochezia, dysuria, hematuria, rash, seizure activity, orthopnea, PND, pedal edema, claudication. Remaining systems are negative.  Physical Exam: Well-developed well-nourished in no acute distress.  Skin is warm and dry.  HEENT is normal.  Neck is supple.  Chest is clear to auscultation with normal expansion.  Cardiovascular exam is regular rate and rhythm.  2/6 systolic murmur left sternal border.  No diastolic  murmur. Abdominal exam nontender or distended. No masses palpated. Extremities show no edema. neuro grossly intact   A/P  1 coronary artery disease-patient denies chest pain.  Continue statin.  Aspirin had been discontinued in the setting of anemia requiring transfusion.  Now that colon cancer has been resected; will resume at 81 mg daily.  2 status post aortic valve replacement-continue SBE prophylaxis.  Most recent echocardiogram showed normally functioning aortic valve.  3 hypertension-blood pressure elevated; however she has not taken her medications yet today.  Her blood pressure  is typically controlled.  We will follow and adjust regimen as needed.  4 hyperlipidemia-continue statin.  5 carotid artery disease-followed by vascular surgery.  6 colon cancer-status post robotic ileocolectomy-felt to have stage III colon cancer at most recent evaluation.  Follow-up hematology oncology.  Olga Millers, MD

## 2022-02-03 ENCOUNTER — Ambulatory Visit: Payer: Medicare Other | Attending: Cardiology | Admitting: Cardiology

## 2022-02-03 ENCOUNTER — Encounter: Payer: Self-pay | Admitting: Cardiology

## 2022-02-03 VITALS — BP 168/64 | HR 52 | Ht 59.0 in | Wt 84.0 lb

## 2022-02-03 DIAGNOSIS — I10 Essential (primary) hypertension: Secondary | ICD-10-CM | POA: Diagnosis present

## 2022-02-03 DIAGNOSIS — I2510 Atherosclerotic heart disease of native coronary artery without angina pectoris: Secondary | ICD-10-CM

## 2022-02-03 DIAGNOSIS — E7800 Pure hypercholesterolemia, unspecified: Secondary | ICD-10-CM | POA: Insufficient documentation

## 2022-02-03 DIAGNOSIS — I6523 Occlusion and stenosis of bilateral carotid arteries: Secondary | ICD-10-CM | POA: Diagnosis present

## 2022-02-03 DIAGNOSIS — I359 Nonrheumatic aortic valve disorder, unspecified: Secondary | ICD-10-CM | POA: Diagnosis present

## 2022-02-03 MED ORDER — ASPIRIN 81 MG PO TBEC: 81.0000 mg | DELAYED_RELEASE_TABLET | Freq: Every day | ORAL | 3 refills | Status: AC

## 2022-02-03 NOTE — Patient Instructions (Signed)
Medication Instructions:   START ASPIRIN 81 MG ONCE DAILY  *If you need a refill on your cardiac medications before your next appointment, please call your pharmacy*   Follow-Up: At [facility], you and your health needs are our priority.  As part of our continuing mission to provide you with exceptional heart care, we have created designated Provider Care Teams.  These Care Teams include your primary Cardiologist (physician) and Advanced Practice Providers (APPs -  Physician Assistants and Nurse Practitioners) who all work together to provide you with the care you need, when you need it.  We recommend signing up for the patient portal called "MyChart".  Sign up information is provided on this After Visit Summary.  MyChart is used to connect with patients for Virtual Visits (Telemedicine).  Patients are able to view lab/test results, encounter notes, upcoming appointments, etc.  Non-urgent messages can be sent to your provider as well.   To learn more about what you can do with MyChart, go to [email or web address].    Your next appointment:   6 month(s)  The format for your next appointment:   In Person  Provider:   Olga Millers, MD

## 2022-07-14 NOTE — Progress Notes (Signed)
HPI: Follow-up coronary disease and aortic valve replacement. Patient had cardiac catheterization May 2016 that showed patent stents in the LAD and circumflex and no other obstructive disease. Ejection fraction 50%.  Moderate mitral regurgitation and severe aortic stenosis. She underwent TAVR 7/16. She also has cerebrovascular disease followed by vascular surgery. Carotid Dopplers March 2022 showed 40 to 59% bilateral stenosis.  There was left subclavian stenosis. Echocardiogram May 2023 showed normal LV function, mild left ventricular hypertrophy, grade 1 diastolic dysfunction, mild left atrial enlargement, moderate mitral regurgitation, status post aortic valve replacement with mean gradient 12 mmHg and no aortic insufficiency.  Since last seen, she denies dyspnea, chest pain, palpitations or syncope.  Current Outpatient Medications  Medication Sig Dispense Refill   amLODipine (NORVASC) 5 MG tablet TAKE 1 TABLET BY MOUTH EVERY DAY 90 tablet 3   aspirin EC 81 MG tablet Take 1 tablet (81 mg total) by mouth daily. Swallow whole. 90 tablet 3   atorvastatin (LIPITOR) 80 MG tablet TAKE 1 TABLET BY MOUTH EVERY DAY 90 tablet 3   Cyanocobalamin (VITAMIN B-12 PO) Take 1 tablet by mouth daily.     escitalopram (LEXAPRO) 10 MG tablet Take 1 tablet by mouth daily.     ferrous sulfate 325 (65 FE) MG tablet Take 325 mg by mouth daily with breakfast.     lisinopril (ZESTRIL) 40 MG tablet TAKE 1 TABLET BY MOUTH EVERY DAY 90 tablet 3   metoprolol tartrate (LOPRESSOR) 25 MG tablet TAKE 1 TAB ('25MG'$  TOTAL) BY MOUTH 2 (TWO) TIMES DAILY. PLEASE KEEP UPCOMING APPT FOR FURTHER REFILLS. 180 tablet 1   polyethylene glycol (MIRALAX / GLYCOLAX) 17 g packet Take 17 g by mouth daily.     No current facility-administered medications for this visit.     Past Medical History:  Diagnosis Date   Aortic insufficiency    Arthritis    told that she has OA- pt. reports that she doesn't have any pain from it but she reports  that she has back pain from time to time    CAD (coronary artery disease)    a. 09/2014 Cath: patent LAD/LCX stents, otw nonobs dzs.   Carotid artery occlusion    CHF (congestive heart failure) (HCC)    CVD (cerebrovascular disease)    Diabetes mellitus type 2, controlled (HCC)    GERD (gastroesophageal reflux disease)    History of hiatal hernia    Hyperlipidemia    Hypertension    Mitral regurgitation    PVD (peripheral vascular disease) (HCC)    Severe aortic stenosis    a. 09/2014 Echo: EF 40%, gr2 DD, sev AS (mean 34 mmHg, peak 57 mmHg), mod MR, mild LAE, nl RV, mild TR, triv PI, PASP 41 mmHg;  b. 11/2014 TAVR: Oletta Lamas Sapien 3 THV (size 74m) via L femoral cutdown;  c. 11/2014 postop Echo: EF 45-50%, no rwma, Gr 1 DD, minimally increased tranvsvalvular velocity, no AI/AS, mild MR, mildly dil LA, PASP 425mg.   Shortness of breath dyspnea     Past Surgical History:  Procedure Laterality Date   ABDOMINAL HYSTERECTOMY     vaginal approach, partial    CARDIAC CATHETERIZATION  04/07/07   By Dr. CoBurt Knack- 1. Severe LAD stenosis with successful PCI using a drug-eluting stent. 2. Severe left circumflex stenosis with successful PCI using a single  drug-eluting stent.   CARDIAC CATHETERIZATION N/A 10/07/2014   Procedure: Right/Left Heart Cath and Coronary Angiography;  Surgeon: MiSherren MochaMD;  [facility];  Service: Cardiovascular;  Laterality: N/A;   EYE SURGERY     bilateral cataracts removed & /w IOL   stents  2008   TEE WITHOUT CARDIOVERSION N/A 11/26/2014   Procedure: TRANSESOPHAGEAL ECHOCARDIOGRAM (TEE);  Surgeon: Sherren Mocha, MD;  Location: [place];  Service: Open Heart Surgery;  Laterality: N/A;   TRANSCATHETER AORTIC VALVE REPLACEMENT, TRANSFEMORAL N/A 11/26/2014   Procedure: TRANSCATHETER AORTIC VALVE REPLACEMENT, TRANSFEMORAL;  Surgeon: Sherren Mocha, MD;  Location: [place];  Service: Open Heart Surgery;  Laterality: N/A;   TYMPANOMASTOIDECTOMY  2002   right     Social History   Socioeconomic History   Marital status: Widowed    Spouse name: Not on file   Number of children: Not on file   Years of education: Not on file   Highest education level: Not on file  Occupational History   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  Tobacco Use   Smoking status: Former    Packs/day: 0.40    Years: 5.00    Total pack years: 2.00    Types: Cigarettes    Quit date: 05/24/2006    Years since quitting: 16.1   Smokeless tobacco: Never  Substance and Sexual Activity   Alcohol use: Yes    Alcohol/week: 0.0 standard drinks of alcohol    Comment: wine- Glass/ per day    Drug use: No   Sexual activity: Not on file  Other Topics Concern   Not on file  Social History Narrative   Not on file   Social Determinants of Health   Financial Resource Strain: Not on file  Food Insecurity: Not on file  Transportation Needs: Not on file  Physical Activity: Not on file  Stress: Not on file  Social Connections: Not on file  Intimate Partner Violence: Not on file    Family History  Problem Relation Age of Onset   Heart attack Mother    Heart disease Mother        Before age 58   Hyperlipidemia Mother    Hypertension Mother    Heart disease Sister        Before age 16   Hyperlipidemia Sister    Hypertension Sister    Heart disease Brother        Before age 74   Hyperlipidemia Brother    Heart attack Brother    Heart disease Brother        Before age 66-  Stents - CHF   Pneumonia Other    Heart disease Son 40       After age 13   Heart attack Sister    Heart attack Son    Stroke Maternal Uncle     ROS: Back pain but no fevers or chills, productive cough, hemoptysis, dysphasia, odynophagia, melena, hematochezia, dysuria, hematuria, rash, seizure activity, orthopnea, PND, pedal edema, claudication. Remaining systems are negative.  Physical Exam: Well-developed well-nourished in no acute distress.  Skin is warm and dry.  HEENT is  normal.  Neck is supple.  Chest is clear to auscultation with normal expansion.  Cardiovascular exam is regular rate and rhythm.  Abdominal exam nontender or distended. No masses palpated. Extremities show no edema. neuro grossly intact  ECG-sinus bradycardia at a rate of 50, left bundle branch block.  Personally reviewed  A/P  1 coronary artery disease-patient continues to do well with no chest pain.  Continue statin.  Continue aspirin.  2 history of aortic valve replacement-continue SBE prophylaxis.  3 hyperlipidemia-continue statin.  4 hypertension-patient's blood pressure is borderline; she is also mildly bradycardic.  I will decrease metoprolol to 12.5 mg twice daily.  They will follow her blood pressure at home and we will decrease lisinopril or discontinue amlodipine if needed.  5 carotid artery disease-patient is followed by vascular surgery.  6 mitral regurgitation-moderate on most recent echocardiogram.  Given patient's age would like to be conservative.  Kirk Ruths, MD

## 2022-07-28 ENCOUNTER — Ambulatory Visit: Payer: Medicare Other | Attending: Cardiology | Admitting: Cardiology

## 2022-07-28 ENCOUNTER — Encounter: Payer: Self-pay | Admitting: Cardiology

## 2022-07-28 VITALS — BP 94/58 | HR 50 | Ht 59.0 in | Wt 96.8 lb

## 2022-07-28 DIAGNOSIS — E7800 Pure hypercholesterolemia, unspecified: Secondary | ICD-10-CM | POA: Insufficient documentation

## 2022-07-28 DIAGNOSIS — I2510 Atherosclerotic heart disease of native coronary artery without angina pectoris: Secondary | ICD-10-CM | POA: Diagnosis present

## 2022-07-28 DIAGNOSIS — I10 Essential (primary) hypertension: Secondary | ICD-10-CM | POA: Insufficient documentation

## 2022-07-28 DIAGNOSIS — Z953 Presence of xenogenic heart valve: Secondary | ICD-10-CM | POA: Insufficient documentation

## 2022-07-28 DIAGNOSIS — I359 Nonrheumatic aortic valve disorder, unspecified: Secondary | ICD-10-CM | POA: Diagnosis present

## 2022-07-28 MED ORDER — METOPROLOL TARTRATE 25 MG PO TABS: 12.5000 mg | ORAL_TABLET | Freq: Two times a day (BID) | ORAL | 1 refills | Status: AC

## 2022-07-28 NOTE — Patient Instructions (Signed)
Medication Instructions  DECREASE METOPROLOL TO 12.5 MG TWICE DAILY= 1/2 OF THE 25 MG TABLET TWICE DAILY  *If you need a refill on your cardiac medications before your next appointment, please call your pharmacy*   Follow-Up: At [facility], you and your health needs are our priority.  As part of our continuing mission to provide you with exceptional heart care, we have created designated Provider Care Teams.  These Care Teams include your primary Cardiologist (physician) and Advanced Practice Providers (APPs -  Physician Assistants and Nurse Practitioners) who all work together to provide you with the care you need, when you need it.  We recommend signing up for the patient portal called "MyChart".  Sign up information is provided on this After Visit Summary.  MyChart is used to connect with patients for Virtual Visits (Telemedicine).  Patients are able to view lab/test results, encounter notes, upcoming appointments, etc.  Non-urgent messages can be sent to your provider as well.   To learn more about what you can do with MyChart, go to [email or web address].    Your next appointment:   6 month(s)  Provider:   Kirk Ruths MD

## 2023-01-17 NOTE — Progress Notes (Signed)
HPI: Follow-up coronary disease and aortic valve replacement. Patient had cardiac catheterization May 2016 that showed patent stents in the LAD and circumflex and no other obstructive disease. Ejection fraction 50%.  Moderate mitral regurgitation and severe aortic stenosis. She underwent TAVR 7/16. She also has cerebrovascular disease followed by vascular surgery. Carotid Dopplers March 2022 showed 40 to 59% bilateral stenosis.  There was left subclavian stenosis. Echocardiogram May 2023 showed normal LV function, mild left ventricular hypertrophy, grade 1 diastolic dysfunction, mild left atrial enlargement, moderate mitral regurgitation, status post aortic valve replacement with mean gradient 12 mmHg and no aortic insufficiency.  Since last seen, she denies chest pain, palpitations or syncope.  She does have some dyspnea on exertion.  Current Outpatient Medications  Medication Sig Dispense Refill   amLODipine (NORVASC) 5 MG tablet TAKE 1 TABLET BY MOUTH EVERY DAY 90 tablet 3   aspirin EC 81 MG tablet Take 1 tablet (81 mg total) by mouth daily. Swallow whole. 90 tablet 3   atorvastatin (LIPITOR) 80 MG tablet TAKE 1 TABLET BY MOUTH EVERY DAY 90 tablet 3   Cyanocobalamin (VITAMIN B-12 PO) Take 1 tablet by mouth daily.     escitalopram (LEXAPRO) 10 MG tablet Take 1 tablet by mouth daily.     lisinopril (ZESTRIL) 40 MG tablet TAKE 1 TABLET BY MOUTH EVERY DAY 90 tablet 3   metoprolol tartrate (LOPRESSOR) 25 MG tablet Take 0.5 tablets (12.5 mg total) by mouth 2 (two) times daily. 180 tablet 1   polyethylene glycol (MIRALAX / GLYCOLAX) 17 g packet Take 17 g by mouth daily.     ferrous sulfate 325 (65 FE) MG tablet Take 325 mg by mouth daily with breakfast. (Patient not taking: Reported on 01/26/2023)     No current facility-administered medications for this visit.     Past Medical History:  Diagnosis Date   Aortic insufficiency    Arthritis    told that she has OA- pt. reports that she doesn't  have any pain from it but she reports that she has back pain from time to time    CAD (coronary artery disease)    a. 09/2014 Cath: patent LAD/LCX stents, otw nonobs dzs.   Carotid artery occlusion    CHF (congestive heart failure) (HCC)    CVD (cerebrovascular disease)    Diabetes mellitus type 2, controlled (HCC)    GERD (gastroesophageal reflux disease)    History of hiatal hernia    Hyperlipidemia    Hypertension    Mitral regurgitation    PVD (peripheral vascular disease) (HCC)    Severe aortic stenosis    a. 09/2014 Echo: EF 40%, gr2 DD, sev AS (mean 34 mmHg, peak 57 mmHg), mod MR, mild LAE, nl RV, mild TR, triv PI, PASP 41 mmHg;  b. 11/2014 TAVR: Randa Evens Sapien 3 THV (size 23mm) via L femoral cutdown;  c. 11/2014 postop Echo: EF 45-50%, no rwma, Gr 1 DD, minimally increased tranvsvalvular velocity, no AI/AS, mild MR, mildly dil LA, PASP .   Shortness of breath dyspnea     Past Surgical History:  Procedure Laterality Date   ABDOMINAL HYSTERECTOMY     vaginal approach, partial    CARDIAC CATHETERIZATION  04/07/07   By Dr. Excell Seltzer -- 1. Severe LAD stenosis with successful PCI using a drug-eluting stent. 2. Severe left circumflex stenosis with successful PCI using a single  drug-eluting stent.   CARDIAC CATHETERIZATION N/A 10/07/2014   Procedure: Right/Left Heart Cath and Coronary Angiography;  Surgeon: Tonny Bollman, MD;  Location: [facility] INVASIVE CV LAB;  Service: Cardiovascular;  Laterality: N/A;   EYE SURGERY     bilateral cataracts removed & /w IOL   stents  2008   TEE WITHOUT CARDIOVERSION N/A 11/26/2014   Procedure: TRANSESOPHAGEAL ECHOCARDIOGRAM (TEE);  Surgeon: Tonny Bollman, MD;  Location[facility] OR;  Service: Open Heart Surgery;  Laterality: N/A;   TRANSCATHETER AORTIC VALVE REPLACEMENT, TRANSFEMORAL N/A 11/26/2014   Procedure: TRANSCATHETER AORTIC VALVE REPLACEMENT, TRANSFEMORAL;  Surgeon: Tonny Bollman, MD;  Location: [facility] OR;  Service: Open Heart Surgery;  Laterality: N/A;    TYMPANOMASTOIDECTOMY  2002   right    Social History   Socioeconomic History   Marital status: Widowed    Spouse name: Not on file   Number of children: Not on file   Years of education: Not on file   Highest education level: Not on file  Occupational History   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  Tobacco Use   Smoking status: Former    Current packs/day: 0.00    Average packs/day: 0.4 packs/day for 5.0 years (2.0 ttl pk-yrs)    Types: Cigarettes    Start date: 05/24/2001    Quit date: 05/24/2006    Years since quitting: 16.6   Smokeless tobacco: Never  Substance and Sexual Activity   Alcohol use: Yes    Alcohol/week: 0.0 standard drinks of alcohol    Comment: wine- Glass/ per day    Drug use: No   Sexual activity: Not on file  Other Topics Concern   Not on file  Social History Narrative   Not on file   Social Determinants of Health   Financial Resource Strain: Low Risk  (01/06/2023)   Received from [facility]   Overall Financial Resource Strain (CARDIA)    Difficulty of Paying Living Expenses: Not hard at all  Food Insecurity: No Food Insecurity (01/06/2023)   Received from [facility]   Hunger Vital Sign    Worried About Running Out of Food in the Last Year: Never true    Ran Out of Food in the Last Year: Never true  Transportation Needs: No Transportation Needs (01/06/2023)   Received from [facility] - Transportation    Lack of Transportation (Medical): No    Lack of Transportation (Non-Medical): No  Physical Activity: Unknown (12/28/2021)   Received from [facility], [facility]   Exercise Vital Sign    Days of Exercise per Week: 0 days    Minutes of Exercise per Session: Not on file  Stress: No Stress Concern Present (12/28/2021)   Received from [facility], [facility] - Occupational Stress Questionnaire    Feeling of Stress : Not at all  Social Connections: Unknown (12/31/2022)    Received from [facility]   Social Network    Social Network: Not on file  Intimate Partner Violence: Unknown (12/31/2022)   Received from [facility]   HITS    Physically Hurt: Not on file    Insult or Talk Down To: Not on file    Threaten Physical Harm: Not on file    Scream or Curse: Not on file    Family History  Problem Relation Age of Onset   Heart attack Mother    Heart disease Mother        Before age 34   Hyperlipidemia Mother    Hypertension Mother    Heart disease Sister  Before age 82   Hyperlipidemia Sister    Hypertension Sister    Heart disease Brother        Before age 56   Hyperlipidemia Brother    Heart attack Brother    Heart disease Brother        Before age 48-  Stents - CHF   Pneumonia Other    Heart disease Son 53       After age 87   Heart attack Sister    Heart attack Son    Stroke Maternal Uncle     ROS: no fevers or chills, productive cough, hemoptysis, dysphasia, odynophagia, melena, hematochezia, dysuria, hematuria, rash, seizure activity, orthopnea, PND, pedal edema, claudication. Remaining systems are negative.  Physical Exam: Well-developed well-nourished in no acute distress.  Skin is warm and dry.  HEENT is normal.  Neck is supple.  Chest is clear to auscultation with normal expansion.  Cardiovascular exam is regular rate and rhythm.  2/6 systolic murmur left sternal border.  No diastolic murmur. Abdominal exam nontender or distended. No masses palpated. Extremities show no edema. neuro grossly intact   A/P  1 coronary artery disease-patient denies chest pain.  Continue aspirin and statin.  2 status post aortic valve replacement-continue SBE prophylaxis.  3 hypertension-blood pressure mildly elevated.  She will follow this and we will advance regimen if needed.  4 hyperlipidemia-continue statin.  5 carotid artery disease-patient is followed by vascular surgery.  6 mitral regurgitation-will plan repeat  echocardiogram when she returns in 6 months.  However I would like to be conservative given patient's age.  Olga Millers, MD

## 2023-01-26 ENCOUNTER — Encounter: Payer: Self-pay | Admitting: Cardiology

## 2023-01-26 ENCOUNTER — Ambulatory Visit: Payer: Medicare Other | Attending: Cardiology | Admitting: Cardiology

## 2023-01-26 VITALS — BP 160/62 | HR 59 | Ht 59.0 in | Wt 90.1 lb

## 2023-01-26 DIAGNOSIS — I2510 Atherosclerotic heart disease of native coronary artery without angina pectoris: Secondary | ICD-10-CM | POA: Insufficient documentation

## 2023-01-26 DIAGNOSIS — Z953 Presence of xenogenic heart valve: Secondary | ICD-10-CM | POA: Insufficient documentation

## 2023-01-26 DIAGNOSIS — I10 Essential (primary) hypertension: Secondary | ICD-10-CM | POA: Diagnosis present

## 2023-01-26 DIAGNOSIS — I359 Nonrheumatic aortic valve disorder, unspecified: Secondary | ICD-10-CM | POA: Insufficient documentation

## 2023-01-26 DIAGNOSIS — E7800 Pure hypercholesterolemia, unspecified: Secondary | ICD-10-CM | POA: Insufficient documentation

## 2023-01-26 NOTE — Patient Instructions (Signed)
Medication Instructions:   Your physician recommends that you continue on your current medications as directed. Please refer to the Current Medication list given to you today.   *If you need a refill on your cardiac medications before your next appointment, please call your pharmacy*   Lab Work:  None ordered.  If you have labs (blood work) drawn today and your tests are completely normal, you will receive your results only by: MyChart Message (if you have MyChart) OR A paper copy in the mail If you have any lab test that is abnormal or we need to change your treatment, we will call you to review the results.   Testing/Procedures:  None ordered.   Follow-Up: At [facility], you and your health needs are our priority.  As part of our continuing mission to provide you with exceptional heart care, we have created designated Provider Care Teams.  These Care Teams include your primary Cardiologist (physician) and Advanced Practice Providers (APPs -  Physician Assistants and Nurse Practitioners) who all work together to provide you with the care you need, when you need it.  We recommend signing up for the patient portal called "MyChart".  Sign up information is provided on this After Visit Summary.  MyChart is used to connect with patients for Virtual Visits (Telemedicine).  Patients are able to view lab/test results, encounter notes, upcoming appointments, etc.  Non-urgent messages can be sent to your provider as well.   To learn more about what you can do with MyChart, go to [email or web address].    Your next appointment:   6 month(s)  Provider:   Olga Millers, MD

## 2023-06-24 NOTE — Progress Notes (Signed)
HPI: Follow-up coronary disease and aortic valve replacement. Patient had cardiac catheterization May 2016 that showed patent stents in the LAD and circumflex and no other obstructive disease. Ejection fraction 50%. Moderate mitral regurgitation and severe aortic stenosis. She underwent TAVR 7/16. She also has cerebrovascular disease followed by vascular surgery. Carotid Dopplers March 2022 showed 40 to 59% bilateral stenosis.  There was left subclavian stenosis. Echocardiogram May 2023 showed normal LV function, mild left ventricular hypertrophy, grade 1 diastolic dysfunction, mild left atrial enlargement, moderate mitral regurgitation, status post aortic valve replacement with mean gradient 12 mmHg and no aortic insufficiency.  Since last seen, she has dyspnea on exertion unchanged.  No orthopnea, PND, pedal edema, chest pain or syncope.  Current Outpatient Medications  Medication Sig Dispense Refill   amLODipine (NORVASC) 5 MG tablet TAKE 1 TABLET BY MOUTH EVERY DAY 90 tablet 3   aspirin EC 81 MG tablet Take 1 tablet (81 mg total) by mouth daily. Swallow whole. 90 tablet 3   atorvastatin (LIPITOR) 80 MG tablet TAKE 1 TABLET BY MOUTH EVERY DAY 90 tablet 3   Cyanocobalamin (VITAMIN B-12 PO) Take 1 tablet by mouth daily.     lisinopril (ZESTRIL) 40 MG tablet TAKE 1 TABLET BY MOUTH EVERY DAY 90 tablet 3   metoprolol tartrate (LOPRESSOR) 25 MG tablet Take 0.5 tablets (12.5 mg total) by mouth 2 (two) times daily. 180 tablet 1   escitalopram (LEXAPRO) 10 MG tablet Take 1 tablet by mouth daily. (Patient not taking: Reported on 07/06/2023)     ferrous sulfate 325 (65 FE) MG tablet Take 325 mg by mouth daily with breakfast. (Patient not taking: Reported on 01/26/2023)     polyethylene glycol (MIRALAX / GLYCOLAX) 17 g packet Take 17 g by mouth daily.     No current facility-administered medications for this visit.     Past Medical History:  Diagnosis Date   Aortic insufficiency    Arthritis     told that she has OA- pt. reports that she doesn't have any pain from it but she reports that she has back pain from time to time    CAD (coronary artery disease)    a. 09/2014 Cath: patent LAD/LCX stents, otw nonobs dzs.   Carotid artery occlusion    CHF (congestive heart failure) (HCC)    CVD (cerebrovascular disease)    Diabetes mellitus type 2, controlled (HCC)    GERD (gastroesophageal reflux disease)    History of hiatal hernia    Hyperlipidemia    Hypertension    Mitral regurgitation    PVD (peripheral vascular disease) (HCC)    Severe aortic stenosis    a. 09/2014 Echo: EF 40%, gr2 DD, sev AS (mean 34 mmHg, peak 57 mmHg), mod MR, mild LAE, nl RV, mild TR, triv PI, PASP 41 mmHg;  b. 11/2014 TAVR: Randa Evens Sapien 3 THV (size 23mm) via L femoral cutdown;  c. 11/2014 postop Echo: EF 45-50%, no rwma, Gr 1 DD, minimally increased tranvsvalvular velocity, no AI/AS, mild MR, mildly dil LA, PASP .   Shortness of breath dyspnea     Past Surgical History:  Procedure Laterality Date   ABDOMINAL HYSTERECTOMY     vaginal approach, partial    CARDIAC CATHETERIZATION  04/07/07   By Dr. Excell Seltzer -- 1. Severe LAD stenosis with successful PCI using a drug-eluting stent. 2. Severe left circumflex stenosis with successful PCI using a single  drug-eluting stent.   CARDIAC CATHETERIZATION N/A 10/07/2014   Procedure: Right/Left  Heart Cath and Coronary Angiography;  Surgeon: Tonny Bollman, MD;  Location[facility] INVASIVE CV LAB;  Service: Cardiovascular;  Laterality: N/A;   EYE SURGERY     bilateral cataracts removed & /w IOL   stents  2008   TEE WITHOUT CARDIOVERSION N/A 11/26/2014   Procedure: TRANSESOPHAGEAL ECHOCARDIOGRAM (TEE);  Surgeon: Tonny Bollman, MD;  Location: [facility] OR;  Service: Open Heart Surgery;  Laterality: N/A;   TRANSCATHETER AORTIC VALVE REPLACEMENT, TRANSFEMORAL N/A 11/26/2014   Procedure: TRANSCATHETER AORTIC VALVE REPLACEMENT, TRANSFEMORAL;  Surgeon: Tonny Bollman, MD;  Location: [facility] OR;   Service: Open Heart Surgery;  Laterality: N/A;   TYMPANOMASTOIDECTOMY  2002   right    Social History   Socioeconomic History   Marital status: Widowed    Spouse name: Not on file   Number of children: Not on file   Years of education: Not on file   Highest education level: Not on file  Occupational History   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  Tobacco Use   Smoking status: Former    Current packs/day: 0.00    Average packs/day: 0.4 packs/day for 5.0 years (2.0 ttl pk-yrs)    Types: Cigarettes    Start date: 05/24/2001    Quit date: 05/24/2006    Years since quitting: 17.1   Smokeless tobacco: Never  Substance and Sexual Activity   Alcohol use: Yes    Alcohol/week: 0.0 standard drinks of alcohol    Comment: wine- Glass/ per day    Drug use: No   Sexual activity: Not on file  Other Topics Concern   Not on file  Social History Narrative   Not on file   Social Drivers of Health   Financial Resource Strain: Low Risk  (01/06/2023)   Received from [facility]   Overall Financial Resource Strain (CARDIA)    Difficulty of Paying Living Expenses: Not hard at all  Food Insecurity: Low Risk  (03/03/2023)   Received from [facility]   Hunger Vital Sign    Worried About Running Out of Food in the Last Year: Never true    Ran Out of Food in the Last Year: Never true  Transportation Needs: No Transportation Needs (03/03/2023)   Received from [facility]    In the past 12 months, has lack of reliable transportation kept you from medical appointments, meetings, work or from getting things needed for daily living? : No  Physical Activity: Unknown (12/28/2021)   Received from [facility], [facility]   Exercise Vital Sign    Days of Exercise per Week: 0 days    Minutes of Exercise per Session: Not on file  Stress: No Stress Concern Present (12/28/2021)   Received from [facility] -  Occupational Stress Questionnaire    Feeling of Stress : Not at all  Social Connections: Unknown (12/31/2022)   Received from [facility]   Social Network    Social Network: Not on file  Intimate Partner Violence: Unknown (12/31/2022)   Received from [facility]   HITS    Physically Hurt: Not on file    Insult or Talk Down To: Not on file    Threaten Physical Harm: Not on file    Scream or Curse: Not on file    Family History  Problem Relation Age of Onset   Heart attack Mother    Heart disease Mother        Before age  60   Hyperlipidemia Mother    Hypertension Mother    Heart disease Sister        Before age 31   Hyperlipidemia Sister    Hypertension Sister    Heart disease Brother        Before age 31   Hyperlipidemia Brother    Heart attack Brother    Heart disease Brother        Before age 73-  Stents - CHF   Pneumonia Other    Heart disease Son 72       After age 16   Heart attack Sister    Heart attack Son    Stroke Maternal Uncle     ROS: no fevers or chills, productive cough, hemoptysis, dysphasia, odynophagia, melena, hematochezia, dysuria, hematuria, rash, seizure activity, orthopnea, PND, pedal edema, claudication. Remaining systems are negative.  Physical Exam: Well-developed well-nourished in no acute distress.  Skin is warm and dry.  HEENT is normal.  Neck is supple.  Chest is clear to auscultation with normal expansion.  Cardiovascular exam is regular rate and rhythm.  1/6 systolic murmur left sternal border.  No diastolic murmur noted. Abdominal exam nontender or distended. No masses palpated. Extremities show no edema. neuro grossly intact  EKG Interpretation Date/Time:  Wednesday July 06 2023 08:36:34 EST Ventricular Rate:  59 PR Interval:  160 QRS Duration:  138 QT Interval:  478 QTC Calculation: 473 R Axis:   270  Text Interpretation: Sinus bradycardia Right superior axis deviation Left bundle branch block No significant change was  found Confirmed by Olga Millers ([id number]) on 07/06/2023 8:38:21 AM    A/P  1 coronary artery disease-continue aspirin and statin.  She denies chest pain.  2 hypertension-patient's blood pressure is controlled.  Continue present medications.  3 history of aortic valve replacement-continue SBE prophylaxis.  4 hyperlipidemia-continue statin.  5 mitral regurgitation-we discussed repeating her echocardiogram today.  However given her age I would like to be conservative.  Not clear that she would be agreeable to procedures in the future.  Will follow for now.  6 carotid artery disease-followed by vascular surgery.  Olga Millers, MD

## 2023-07-06 ENCOUNTER — Ambulatory Visit: Payer: Medicare Other | Attending: Cardiology | Admitting: Cardiology

## 2023-07-06 ENCOUNTER — Encounter: Payer: Self-pay | Admitting: Cardiology

## 2023-07-06 VITALS — BP 140/62 | HR 59 | Ht 59.0 in | Wt 89.8 lb

## 2023-07-06 DIAGNOSIS — E7800 Pure hypercholesterolemia, unspecified: Secondary | ICD-10-CM | POA: Insufficient documentation

## 2023-07-06 DIAGNOSIS — Z953 Presence of xenogenic heart valve: Secondary | ICD-10-CM | POA: Insufficient documentation

## 2023-07-06 DIAGNOSIS — I359 Nonrheumatic aortic valve disorder, unspecified: Secondary | ICD-10-CM | POA: Diagnosis present

## 2023-07-06 DIAGNOSIS — I10 Essential (primary) hypertension: Secondary | ICD-10-CM | POA: Insufficient documentation

## 2023-07-06 DIAGNOSIS — I2510 Atherosclerotic heart disease of native coronary artery without angina pectoris: Secondary | ICD-10-CM | POA: Diagnosis present

## 2023-07-06 NOTE — Patient Instructions (Signed)
Follow-Up: At [facility], you and your health needs are our priority.  As part of our continuing mission to provide you with exceptional heart care, we have created designated Provider Care Teams.  These Care Teams include your primary Cardiologist (physician) and Advanced Practice Providers (APPs -  Physician Assistants and Nurse Practitioners) who all work together to provide you with the care you need, when you need it.  We recommend signing up for the patient portal called "MyChart".  Sign up information is provided on this After Visit Summary.  MyChart is used to connect with patients for Virtual Visits (Telemedicine).  Patients are able to view lab/test results, encounter notes, upcoming appointments, etc.  Non-urgent messages can be sent to your provider as well.   To learn more about what you can do with MyChart, go to [email or web address].    Your next appointment:   6 month(s)  Provider:   Olga Millers, MD

## 2024-01-19 NOTE — Progress Notes (Deleted)
 HPI: Follow-up coronary disease and aortic valve replacement. Patient had cardiac catheterization May 2016 that showed patent stents in the LAD and circumflex and no other obstructive disease. Ejection fraction 50%. Moderate mitral regurgitation and severe aortic stenosis. She underwent TAVR 7/16. She also has cerebrovascular disease followed by vascular surgery. Carotid Dopplers March 2022 showed 40 to 59% bilateral stenosis.  There was left subclavian stenosis. Echocardiogram May 2023 showed normal LV function, mild left ventricular hypertrophy, grade 1 diastolic dysfunction, mild left atrial enlargement, moderate mitral regurgitation, status post aortic valve replacement with mean gradient 12 mmHg and no aortic insufficiency.  Since last seen,   Current Outpatient Medications  Medication Sig Dispense Refill   amLODipine  (NORVASC ) 5 MG tablet TAKE 1 TABLET BY MOUTH EVERY DAY 90 tablet 3   aspirin  EC 81 MG tablet Take 1 tablet (81 mg total) by mouth daily. Swallow whole. 90 tablet 3   atorvastatin  (LIPITOR ) 80 MG tablet TAKE 1 TABLET BY MOUTH EVERY DAY 90 tablet 3   Cyanocobalamin (VITAMIN B-12 PO) Take 1 tablet by mouth daily.     escitalopram (LEXAPRO) 10 MG tablet Take 1 tablet by mouth daily. (Patient not taking: Reported on 07/06/2023)     ferrous sulfate 325 (65 FE) MG tablet Take 325 mg by mouth daily with breakfast. (Patient not taking: Reported on 01/26/2023)     lisinopril  (ZESTRIL ) 40 MG tablet TAKE 1 TABLET BY MOUTH EVERY DAY 90 tablet 3   metoprolol  tartrate (LOPRESSOR ) 25 MG tablet Take 0.5 tablets (12.5 mg total) by mouth 2 (two) times daily. 180 tablet 1   polyethylene glycol (MIRALAX / GLYCOLAX) 17 g packet Take 17 g by mouth daily.     No current facility-administered medications for this visit.     Past Medical History:  Diagnosis Date   Aortic insufficiency    Arthritis    told that she has OA- pt. reports that she doesn't have any pain from it but she reports that she  has back pain from time to time    CAD (coronary artery disease)    a. 09/2014 Cath: patent LAD/LCX stents, otw nonobs dzs.   Carotid artery occlusion    CHF (congestive heart failure) (HCC)    CVD (cerebrovascular disease)    Diabetes mellitus type 2, controlled (HCC)    GERD (gastroesophageal reflux disease)    History of hiatal hernia    Hyperlipidemia    Hypertension    Mitral regurgitation    PVD (peripheral vascular disease) (HCC)    Severe aortic stenosis    a. 09/2014 Echo: EF 40%, gr2 DD, sev AS (mean 34 mmHg, peak 57 mmHg), mod MR, mild LAE, nl RV, mild TR, triv PI, PASP 41 mmHg;  b. 11/2014 TAVR: Celestia Sapien 3 THV (size 23mm) via L femoral cutdown;  c. 11/2014 postop Echo: EF 45-50%, no rwma, Gr 1 DD, minimally increased tranvsvalvular velocity, no AI/AS, mild MR, mildly dil LA, PASP .   Shortness of breath dyspnea     Past Surgical History:  Procedure Laterality Date   ABDOMINAL HYSTERECTOMY     vaginal approach, partial    CARDIAC CATHETERIZATION  04/07/07   By Dr. Wonda -- 1. Severe LAD stenosis with successful PCI using a drug-eluting stent. 2. Severe left circumflex stenosis with successful PCI using a single  drug-eluting stent.   CARDIAC CATHETERIZATION N/A 10/07/2014   Procedure: Right/Left Heart Cath and Coronary Angiography;  Surgeon: Ozell Wonda, MD;  Location: [facility] INVASIVE CV  LAB;  Service: Cardiovascular;  Laterality: N/A;   EYE SURGERY     bilateral cataracts removed & /w IOL   stents  2008   TEE WITHOUT CARDIOVERSION N/A 11/26/2014   Procedure: TRANSESOPHAGEAL ECHOCARDIOGRAM (TEE);  Surgeon: Ozell Fell, MD;  Location: [facility] OR;  Service: Open Heart Surgery;  Laterality: N/A;   TRANSCATHETER AORTIC VALVE REPLACEMENT, TRANSFEMORAL N/A 11/26/2014   Procedure: TRANSCATHETER AORTIC VALVE REPLACEMENT, TRANSFEMORAL;  Surgeon: Ozell Fell, MD;  Location: [facility] OR;  Service: Open Heart Surgery;  Laterality: N/A;   TYMPANOMASTOIDECTOMY  2002   right    Social  History   Socioeconomic History   Marital status: Widowed    Spouse name: Not on file   Number of children: Not on file   Years of education: Not on file   Highest education level: Not on file  Occupational History   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  Tobacco Use   Smoking status: Former    Current packs/day: 0.00    Average packs/day: 0.4 packs/day for 5.0 years (2.0 ttl pk-yrs)    Types: Cigarettes    Start date: 05/24/2001    Quit date: 05/24/2006    Years since quitting: 17.6   Smokeless tobacco: Never  Substance and Sexual Activity   Alcohol use: Yes    Alcohol/week: 0.0 standard drinks of alcohol    Comment: wine- Glass/ per day    Drug use: No   Sexual activity: Not on file  Other Topics Concern   Not on file  Social History Narrative   Not on file   Social Drivers of Health   Financial Resource Strain: Low Risk  (01/06/2023)   Received from [facility]   Overall Financial Resource Strain (CARDIA)    Difficulty of Paying Living Expenses: Not hard at all  Food Insecurity: Low Risk  (08/25/2023)   Received from [facility]   Hunger Vital Sign    Worried About Running Out of Food in the Last Year: Never true    Ran Out of Food in the Last Year: Never true  Transportation Needs: No Transportation Needs (08/25/2023)   Received from [employer]    In the past 12 months, has lack of reliable transportation kept you from medical appointments, meetings, work or from getting things needed for daily living? : No  Physical Activity: Unknown (12/28/2021)   Received from [facility]   Exercise Vital Sign    Days of Exercise per Week: 0 days    Minutes of Exercise per Session: Not on file  Stress: No Stress Concern Present (12/28/2021)   Received from [facility] - Occupational Stress Questionnaire    Feeling of Stress : Not at all  Social Connections: Unknown (12/31/2022)   Received from [facility]   Social Network    Social Network: Not on file  Intimate Partner Violence: Unknown (12/31/2022)   Received from [facility]   HITS    Physically Hurt: Not on file    Insult or Talk Down To: Not on file    Threaten Physical Harm: Not on file    Scream or Curse: Not on file    Family History  Problem Relation Age of Onset   Heart attack Mother    Heart disease Mother        Before age 60   Hyperlipidemia Mother    Hypertension Mother    Heart disease Sister  Before age 66   Hyperlipidemia Sister    Hypertension Sister    Heart disease Brother        Before age 46   Hyperlipidemia Brother    Heart attack Brother    Heart disease Brother        Before age 100-  Stents - CHF   Pneumonia Other    Heart disease Son 39       After age 58   Heart attack Sister    Heart attack Son    Stroke Maternal Uncle     ROS: no fevers or chills, productive cough, hemoptysis, dysphasia, odynophagia, melena, hematochezia, dysuria, hematuria, rash, seizure activity, orthopnea, PND, pedal edema, claudication. Remaining systems are negative.  Physical Exam: Well-developed well-nourished in no acute distress.  Skin is warm and dry.  HEENT is normal.  Neck is supple.  Chest is clear to auscultation with normal expansion.  Cardiovascular exam is regular rate and rhythm.  Abdominal exam nontender or distended. No masses palpated. Extremities show no edema. neuro grossly intact  ECG- personally reviewed  A/P  1 coronary artery disease-patient denies chest pain.  Plan to continue medical therapy with aspirin  and statin.  2 status post aortic valve replacement-continue SBE prophylaxis.  3 hypertension-blood pressure controlled.  Continue present medical regimen.  4 hyperlipidemia-continue statin.  5 mitral regurgitation-moderate on most recent echocardiogram.  Will plan follow-up echocardiogram.  Would like to be conservative if possible.  6 carotid artery disease-per  vascular surgery.  Redell Shallow, MD

## 2024-02-01 ENCOUNTER — Ambulatory Visit: Admitting: Cardiology

## 2024-04-12 NOTE — Progress Notes (Signed)
 HPI: Follow-up coronary disease and aortic valve replacement. Patient had cardiac catheterization May 2016 that showed patent stents in the LAD and circumflex and no other obstructive disease. Ejection fraction 50%. Moderate mitral regurgitation and severe aortic stenosis. She underwent TAVR 7/16. She also has cerebrovascular disease followed by vascular surgery. Carotid Dopplers March 2022 showed 40 to 59% bilateral stenosis.  There was left subclavian stenosis. Echocardiogram May 2023 showed normal LV function, mild left ventricular hypertrophy, grade 1 diastolic dysfunction, mild left atrial enlargement, moderate mitral regurgitation, status post aortic valve replacement with mean gradient 12 mmHg and no aortic insufficiency.  Since last seen, she has some dyspnea on exertion but no orthopnea, PND, pedal edema, chest pain or syncope.  Current Outpatient Medications  Medication Sig Dispense Refill   acetaminophen  (TYLENOL ) 500 MG tablet Take 1,000 mg by mouth every 4 (four) hours as needed.     amLODipine  (NORVASC ) 5 MG tablet TAKE 1 TABLET BY MOUTH EVERY DAY 90 tablet 3   aspirin  EC 81 MG tablet Take 1 tablet (81 mg total) by mouth daily. Swallow whole. 90 tablet 3   atorvastatin  (LIPITOR ) 80 MG tablet TAKE 1 TABLET BY MOUTH EVERY DAY 90 tablet 3   Cyanocobalamin (VITAMIN B-12 PO) Take 1 tablet by mouth daily.     lisinopril  (ZESTRIL ) 40 MG tablet TAKE 1 TABLET BY MOUTH EVERY DAY 90 tablet 3   metoprolol  tartrate (LOPRESSOR ) 25 MG tablet Take 0.5 tablets (12.5 mg total) by mouth 2 (two) times daily. 180 tablet 1   traMADol  (ULTRAM ) 50 MG tablet Take 50 mg by mouth every 6 (six) hours as needed.     No current facility-administered medications for this visit.     Past Medical History:  Diagnosis Date   Aortic insufficiency    Arthritis    told that she has OA- pt. reports that she doesn't have any pain from it but she reports that she has back pain from time to time    CAD (coronary  artery disease)    a. 09/2014 Cath: patent LAD/LCX stents, otw nonobs dzs.   Carotid artery occlusion    CHF (congestive heart failure) (HCC)    CVD (cerebrovascular disease)    Diabetes mellitus type 2, controlled (HCC)    GERD (gastroesophageal reflux disease)    History of hiatal hernia    Hyperlipidemia    Hypertension    Mitral regurgitation    PVD (peripheral vascular disease)    Severe aortic stenosis    a. 09/2014 Echo: EF 40%, gr2 DD, sev AS (mean 34 mmHg, peak 57 mmHg), mod MR, mild LAE, nl RV, mild TR, triv PI, PASP 41 mmHg;  b. 11/2014 TAVR: Celestia Sapien 3 THV (size 23mm) via L femoral cutdown;  c. 11/2014 postop Echo: EF 45-50%, no rwma, Gr 1 DD, minimally increased tranvsvalvular velocity, no AI/AS, mild MR, mildly dil LA, PASP .   Shortness of breath dyspnea     Past Surgical History:  Procedure Laterality Date   ABDOMINAL HYSTERECTOMY     vaginal approach, partial    CARDIAC CATHETERIZATION  04/07/07   By Dr. Wonda -- 1. Severe LAD stenosis with successful PCI using a drug-eluting stent. 2. Severe left circumflex stenosis with successful PCI using a single  drug-eluting stent.   CARDIAC CATHETERIZATION N/A 10/07/2014   Procedure: Right/Left Heart Cath and Coronary Angiography;  Surgeon: Ozell Wonda, MD;  Location: [facility] INVASIVE CV LAB;  Service: Cardiovascular;  Laterality: N/A;   EYE  SURGERY     bilateral cataracts removed & /w IOL   stents  2008   TEE WITHOUT CARDIOVERSION N/A 11/26/2014   Procedure: TRANSESOPHAGEAL ECHOCARDIOGRAM (TEE);  Surgeon: Ozell Fell, MD;  Location: [facility] OR;  Service: Open Heart Surgery;  Laterality: N/A;   TRANSCATHETER AORTIC VALVE REPLACEMENT, TRANSFEMORAL N/A 11/26/2014   Procedure: TRANSCATHETER AORTIC VALVE REPLACEMENT, TRANSFEMORAL;  Surgeon: Ozell Fell, MD;  Location: [facility] OR;  Service: Open Heart Surgery;  Laterality: N/A;   TYMPANOMASTOIDECTOMY  2002   right    Social History   Socioeconomic History   Marital status:  Widowed    Spouse name: Not on file   Number of children: Not on file   Years of education: Not on file   Highest education level: Not on file  Occupational History   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  Tobacco Use   Smoking status: Former    Current packs/day: 0.00    Average packs/day: 0.4 packs/day for 5.0 years (2.0 ttl pk-yrs)    Types: Cigarettes    Start date: 05/24/2001    Quit date: 05/24/2006    Years since quitting: 17.9   Smokeless tobacco: Never  Substance and Sexual Activity   Alcohol use: Yes    Alcohol/week: 0.0 standard drinks of alcohol    Comment: wine- Glass/ per day    Drug use: No   Sexual activity: Not on file  Other Topics Concern   Not on file  Social History Narrative   Not on file   Social Drivers of Health   Financial Resource Strain: Low Risk  (01/06/2023)   Received from [facility]   Overall Financial Resource Strain (CARDIA)    Difficulty of Paying Living Expenses: Not hard at all  Food Insecurity: Low Risk  (03/01/2024)   Received from [facility]   Hunger Vital Sign    Within the past 12 months, the food you bought just didn't last and you didn't have money to get more. : Never true    Within the past 12 months, you worried that your food would run out before you got money to buy more: Never true  Transportation Needs: No Transportation Needs (03/01/2024)   Received from [employer]    In the past 12 months, has lack of reliable transportation kept you from medical appointments, meetings, work or from getting things needed for daily living? : No  Physical Activity: Unknown (12/28/2021)   Received from [facility]   Exercise Vital Sign    On average, how many days per week do you engage in moderate to strenuous exercise (like a brisk walk)?: 0 days    Minutes of Exercise per Session: Not on file  Stress: No Stress Concern Present (12/28/2021)   Received from [facility] of Occupational  Health - Occupational Stress Questionnaire    Feeling of Stress : Not at all  Social Connections: Moderately Integrated (12/28/2021)   Received from [facility]   Social Network    How would you rate your social network (family, work, friends)?: Adequate participation with social networks  Intimate Partner Violence: Not At Risk (12/28/2021)   Received from [facility]   HITS    Over the last 12 months how often did your partner physically hurt you?: Never    Over the last 12 months how often did your partner insult you or talk down to you?: Never    Over the last 12 months how  often did your partner threaten you with physical harm?: Never    Over the last 12 months how often did your partner scream or curse at you?: Never    Family History  Problem Relation Age of Onset   Heart attack Mother    Heart disease Mother        Before age 60   Hyperlipidemia Mother    Hypertension Mother    Heart disease Sister        Before age 19   Hyperlipidemia Sister    Hypertension Sister    Heart disease Brother        Before age 73   Hyperlipidemia Brother    Heart attack Brother    Heart disease Brother        Before age 82-  Stents - CHF   Pneumonia Other    Heart disease Son 102       After age 23   Heart attack Sister    Heart attack Son    Stroke Maternal Uncle     ROS: no fevers or chills, productive cough, hemoptysis, dysphasia, odynophagia, melena, hematochezia, dysuria, hematuria, rash, seizure activity, orthopnea, PND, pedal edema, claudication. Remaining systems are negative.  Physical Exam: Well-developed well-nourished in no acute distress.  Skin is warm and dry.  HEENT is normal.  Neck is supple.  Chest is clear to auscultation with normal expansion.  Cardiovascular exam is regular rate and rhythm.  2/6 systolic murmur left sternal border. Abdominal exam nontender or distended. No masses palpated. Extremities show no edema. neuro grossly intact  EKG  Interpretation Date/Time:  Wednesday April 25 2024 10:07:05 EST Ventricular Rate:  59 PR Interval:  136 QRS Duration:  136 QT Interval:  466 QTC Calculation: 461 R Axis:   -45  Text Interpretation: Sinus bradycardia Left axis deviation Left bundle branch block Confirmed by Pietro Rogue ([id number]) on 04/25/2024 10:10:32 AM    A/P  1 coronary artery disease-patient continues to do well with no chest pain.  Continue aspirin  and statin.  2 hyperlipidemia-continue statin.  3 hypertension-blood pressure controlled.  Continue present medical regimen.  4 history of aortic valve replacement-continue SBE prophylaxis.  Patient does complain of increased dyspnea on exertion.  Repeat echocardiogram.  5 mitral regurgitation-given age we have elected to be conservative.    6 history of carotid artery disease-followed by vascular surgery.  Rogue Pietro, MD

## 2024-04-25 ENCOUNTER — Ambulatory Visit: Attending: Cardiology | Admitting: Cardiology

## 2024-04-25 ENCOUNTER — Encounter: Payer: Self-pay | Admitting: Cardiology

## 2024-04-25 ENCOUNTER — Other Ambulatory Visit: Payer: Self-pay | Admitting: Cardiology

## 2024-04-25 VITALS — BP 131/62 | HR 59 | Ht <= 58 in | Wt 92.8 lb

## 2024-04-25 DIAGNOSIS — I2510 Atherosclerotic heart disease of native coronary artery without angina pectoris: Secondary | ICD-10-CM | POA: Insufficient documentation

## 2024-04-25 DIAGNOSIS — Z953 Presence of xenogenic heart valve: Secondary | ICD-10-CM | POA: Diagnosis present

## 2024-04-25 DIAGNOSIS — I10 Essential (primary) hypertension: Secondary | ICD-10-CM | POA: Diagnosis not present

## 2024-04-25 DIAGNOSIS — E7800 Pure hypercholesterolemia, unspecified: Secondary | ICD-10-CM | POA: Insufficient documentation

## 2024-04-25 DIAGNOSIS — I359 Nonrheumatic aortic valve disorder, unspecified: Secondary | ICD-10-CM

## 2024-04-25 NOTE — Patient Instructions (Signed)
   Testing/Procedures:  Your physician has requested that you have an echocardiogram. Echocardiography is a painless test that uses sound waves to create images of your heart. It provides your doctor with information about the size and shape of your heart and how well your heart's chambers and valves are working. This procedure takes approximately one hour. There are no restrictions for this procedure. Please do NOT wear cologne, perfume, aftershave, or lotions (deodorant is allowed). Please arrive 15 minutes prior to your appointment time.  Please note: We ask at that you not bring children with you during ultrasound (echo/ vascular) testing. Due to room size and safety concerns, children are not allowed in the ultrasound rooms during exams. Our front office staff cannot provide observation of children in our lobby area while testing is being conducted. An adult accompanying a patient to their appointment will only be allowed in the ultrasound room at the discretion of the ultrasound technician under special circumstances. We apologize for any inconvenience. [facility] [facility]  Follow-Up: At [facility], you and your health needs are our priority.  As part of our continuing mission to provide you with exceptional heart care, our providers are all part of one team.  This team includes your primary Cardiologist (physician) and Advanced Practice Providers or APPs (Physician Assistants and Nurse Practitioners) who all work together to provide you with the care you need, when you need it.  Your next appointment:   6 month(s)  Provider:   Redell Shallow, MD IN THE [place] LOCATION

## 2024-05-21 ENCOUNTER — Ambulatory Visit (HOSPITAL_BASED_OUTPATIENT_CLINIC_OR_DEPARTMENT_OTHER)
Admission: RE | Admit: 2024-05-21 | Discharge: 2024-05-21 | Disposition: A | Discharge status: Home / Self Care | Source: Ambulatory Visit | Attending: Cardiology | Admitting: Cardiology

## 2024-05-21 DIAGNOSIS — I359 Nonrheumatic aortic valve disorder, unspecified: Secondary | ICD-10-CM | POA: Insufficient documentation

## 2024-05-21 DIAGNOSIS — I2510 Atherosclerotic heart disease of native coronary artery without angina pectoris: Secondary | ICD-10-CM | POA: Diagnosis not present

## 2024-05-22 ENCOUNTER — Ambulatory Visit: Payer: Self-pay | Admitting: Cardiology

## 2024-05-22 LAB — ECHOCARDIOGRAM COMPLETE
AR max vel: 1.06 cm2
AV Area VTI: 1.04 cm2
AV Area mean vel: 1.06 cm2
AV Mean grad: 27 mmHg
AV Peak grad: 47.1 mmHg
Ao pk vel: 3.43 m/s
Area-P 1/2: 16.49 cm2
Calc EF: 47.8 %
MV M vel: 6.08 m/s
MV Peak grad: 147.9 mmHg
S' Lateral: 3.2 cm
Single Plane A2C EF: 45.9 %
Single Plane A4C EF: 45.9 %

## 2024-08-08 ENCOUNTER — Ambulatory Visit: Admitting: Cardiology
